# Patient Record
Sex: Male | Born: 1940 | Race: White | Hispanic: No | Marital: Married | Smoking: Former smoker
Health system: Southern US, Community
[De-identification: ages and names within clinical notes are randomized; demographics above are authoritative.]

## PROBLEM LIST (undated history)

## (undated) DIAGNOSIS — D72829 Elevated white blood cell count, unspecified: Secondary | ICD-10-CM

## (undated) DIAGNOSIS — T839XXA Unspecified complication of genitourinary prosthetic device, implant and graft, initial encounter: Secondary | ICD-10-CM

## (undated) DIAGNOSIS — I10 Essential (primary) hypertension: Secondary | ICD-10-CM

## (undated) DIAGNOSIS — E119 Type 2 diabetes mellitus without complications: Secondary | ICD-10-CM

## (undated) DIAGNOSIS — I872 Venous insufficiency (chronic) (peripheral): Secondary | ICD-10-CM

## (undated) DIAGNOSIS — N183 Chronic kidney disease, stage 3 unspecified: Secondary | ICD-10-CM

## (undated) DIAGNOSIS — Z01818 Encounter for other preprocedural examination: Secondary | ICD-10-CM

## (undated) DIAGNOSIS — I739 Peripheral vascular disease, unspecified: Secondary | ICD-10-CM

## (undated) DIAGNOSIS — E1122 Type 2 diabetes mellitus with diabetic chronic kidney disease: Secondary | ICD-10-CM

## (undated) DIAGNOSIS — E785 Hyperlipidemia, unspecified: Secondary | ICD-10-CM

## (undated) DIAGNOSIS — I2510 Atherosclerotic heart disease of native coronary artery without angina pectoris: Secondary | ICD-10-CM

## (undated) DIAGNOSIS — E039 Hypothyroidism, unspecified: Secondary | ICD-10-CM

## (undated) DIAGNOSIS — E7800 Pure hypercholesterolemia, unspecified: Secondary | ICD-10-CM

## (undated) HISTORY — DX: Hypothyroidism, unspecified: E03.9

## (undated) HISTORY — DX: Chronic kidney disease, stage 3 unspecified: N18.30

## (undated) HISTORY — DX: Unspecified complication of genitourinary prosthetic device, implant and graft, initial encounter: T83.9XXA

## (undated) HISTORY — DX: Essential (primary) hypertension: I10

## (undated) HISTORY — DX: Elevated white blood cell count, unspecified: D72.829

## (undated) HISTORY — DX: Type 2 diabetes mellitus with diabetic chronic kidney disease: E11.22

## (undated) HISTORY — DX: Hyperlipidemia, unspecified: E78.5

## (undated) HISTORY — PX: OTHER SURGICAL HISTORY: SHX169

## (undated) HISTORY — DX: Encounter for other preprocedural examination: Z01.818

---

## 2008-09-05 ENCOUNTER — Ambulatory Visit: Payer: Self-pay | Admitting: Gastroenterology

## 2019-12-17 ENCOUNTER — Other Ambulatory Visit: Payer: Self-pay

## 2019-12-17 ENCOUNTER — Emergency Department: Payer: Medicare Other

## 2019-12-17 ENCOUNTER — Emergency Department
Admission: EM | Admit: 2019-12-17 | Discharge: 2019-12-17 | Disposition: A | Discharge status: Home / Self Care | Payer: Medicare Other | Attending: Emergency Medicine | Admitting: Emergency Medicine

## 2019-12-17 ENCOUNTER — Encounter: Payer: Self-pay | Admitting: Medical Oncology

## 2019-12-17 DIAGNOSIS — Z79899 Other long term (current) drug therapy: Secondary | ICD-10-CM | POA: Diagnosis not present

## 2019-12-17 DIAGNOSIS — R519 Headache, unspecified: Secondary | ICD-10-CM | POA: Diagnosis not present

## 2019-12-17 DIAGNOSIS — Z7901 Long term (current) use of anticoagulants: Secondary | ICD-10-CM | POA: Diagnosis not present

## 2019-12-17 DIAGNOSIS — S0121XA Laceration without foreign body of nose, initial encounter: Secondary | ICD-10-CM | POA: Insufficient documentation

## 2019-12-17 DIAGNOSIS — W01190A Fall on same level from slipping, tripping and stumbling with subsequent striking against furniture, initial encounter: Secondary | ICD-10-CM | POA: Insufficient documentation

## 2019-12-17 DIAGNOSIS — Y92003 Bedroom of unspecified non-institutional (private) residence as the place of occurrence of the external cause: Secondary | ICD-10-CM | POA: Insufficient documentation

## 2019-12-17 DIAGNOSIS — S0990XA Unspecified injury of head, initial encounter: Secondary | ICD-10-CM

## 2019-12-17 DIAGNOSIS — S0181XA Laceration without foreign body of other part of head, initial encounter: Secondary | ICD-10-CM | POA: Insufficient documentation

## 2019-12-17 DIAGNOSIS — Y9389 Activity, other specified: Secondary | ICD-10-CM | POA: Insufficient documentation

## 2019-12-17 DIAGNOSIS — Z7982 Long term (current) use of aspirin: Secondary | ICD-10-CM | POA: Insufficient documentation

## 2019-12-17 DIAGNOSIS — I10 Essential (primary) hypertension: Secondary | ICD-10-CM | POA: Insufficient documentation

## 2019-12-17 DIAGNOSIS — Y999 Unspecified external cause status: Secondary | ICD-10-CM | POA: Insufficient documentation

## 2019-12-17 HISTORY — DX: Essential (primary) hypertension: I10

## 2019-12-17 MED ORDER — CEPHALEXIN 500 MG PO CAPS: 500.0000 mg | ORAL_CAPSULE | Freq: Three times a day (TID) | ORAL | 0 refills | Status: AC

## 2019-12-17 MED ORDER — LIDOCAINE-EPINEPHRINE-TETRACAINE (LET) TOPICAL GEL
3.0000 mL | Freq: Once | TOPICAL | Status: AC
  Administered 2019-12-17: 3 mL via TOPICAL

## 2019-12-17 MED ORDER — LIDOCAINE-EPINEPHRINE (PF) 1 %-1:200000 IJ SOLN
30.0000 mL | Freq: Once | INTRAMUSCULAR | Status: AC
  Administered 2019-12-17: 30 mL via INTRADERMAL

## 2019-12-17 NOTE — ED Notes (Signed)
Pt to ct scan.

## 2019-12-17 NOTE — ED Notes (Signed)
See triage note  Presents s/p fall  States he fell while making his bed  Laceration noted over right eye and bridge of nose

## 2019-12-17 NOTE — ED Notes (Signed)
Pt has urinated on self. Pt assisted to restroom, chair cleansed. Pt assisted by lisa ed tech into disposable pants.

## 2019-12-17 NOTE — ED Provider Notes (Signed)
[facility] Emergency Department Provider Note  ____________________________________________  Time seen: Approximately 9:38 AM  I have reviewed the triage vital signs and the nursing notes.   HISTORY  Chief Complaint Laceration    HPI Grant Mcdaniel is a 78 y.o. male that presents to the emergency department for evaluation of head injury.  Patient states that he stumbled while making the bed and hit his head on the headboard.  He did not lose consciousness.  He landed on both of his knees.  He has been walking since the fall.  Last tetanus shot was 3 years ago.  He takes Plavix and aspirin.  He denies any current pain.  Patient denies headache, neck pain.  Past Medical History:  Diagnosis Date  . Hypertension     There are no problems to display for this patient.   Prior to Admission medications   Medication Sig Start Date End Date Taking? Authorizing Provider  aspirin EC 81 MG tablet Take 81 mg by mouth daily.   Yes [provider]  atorvastatin (LIPITOR) 40 MG tablet Take 40 mg by mouth daily.   Yes [provider]  bisoprolol-hydrochlorothiazide (ZIAC) 10-6.25 MG tablet Take 1 tablet by mouth daily.   Yes [provider]  clopidogrel (PLAVIX) 75 MG tablet Take 75 mg by mouth daily.   Yes [provider]  cephALEXin (KEFLEX) 500 MG capsule Take 1 capsule (500 mg total) by mouth 3 (three) times daily for 10 days. 12/17/19 12/27/19  Laban Emperor, PA-C    Allergies Sulfa antibiotics  No family history on file.  Social History Social History   Tobacco Use  . Smoking status: Not on file  Substance Use Topics  . Alcohol use: Not on file  . Drug use: Not on file     Review of Systems  Constitutional: No fever/chills Respiratory: No SOB. Gastrointestinal: No abdominal pain.  No nausea, no vomiting.  Musculoskeletal: Negative for musculoskeletal pain. Skin: Negative for rash, ecchymosis.  Positive for  lacerations. Neurological: Negative for headaches, numbness or tingling   ____________________________________________   PHYSICAL EXAM:  VITAL SIGNS: ED Triage Vitals  Enc Vitals Group     BP 12/17/19 0402 (!) 180/66     Pulse Rate 12/17/19 0402 80     Resp 12/17/19 0402 20     Temp 12/17/19 0402 98.1 F (36.7 C)     Temp Source 12/17/19 0402 Oral     SpO2 12/17/19 0402 99 %     Weight 12/17/19 0403 230 lb (104.3 kg)     Height 12/17/19 0403 6\' 2"  (1.88 m)     Head Circumference --      Peak Flow --      Pain Score 12/17/19 0402 0     Pain Loc --      Pain Edu? --      Excl. in [place]? --      Constitutional: Alert and oriented. Well appearing and in no acute distress. Eyes: Conjunctivae are normal. PERRL. EOMI. Head: 2, 2 cm lacerations to right forehead and  1/4 cm laceration to right forehead.  Ecchymosis surrounding lacerations. ENT:      Ears:      Nose: No congestion/rhinnorhea.  Small abrasion to the bridge of the nose.      Mouth/Throat: Mucous membranes are moist.  Neck: No stridor.  No cervical spine tenderness to palpation. Cardiovascular: Normal rate, regular rhythm.  Good peripheral circulation. Respiratory: Normal respiratory effort without tachypnea or  retractions. Lungs CTAB. Good air entry to the bases with no decreased or absent breath sounds. Musculoskeletal: Full range of motion to all extremities. No gross deformities appreciated. Normal gait. Neurologic:  Normal speech and language. No gross focal neurologic deficits are appreciated.  Skin:  Skin is warm, dry and intact. No rash noted. Psychiatric: Mood and affect are normal. Speech and behavior are normal. Patient exhibits appropriate insight and judgement.   ____________________________________________   LABS (all labs ordered are listed, but only abnormal results are displayed)  Labs Reviewed - No data to  display ____________________________________________  EKG   ____________________________________________  RADIOLOGY Robinette Haines, personally viewed and evaluated these images (plain radiographs) as part of my medical decision making, as well as reviewing the written report by the radiologist.  CT Head Wo Contrast  Result Date: 12/17/2019 CLINICAL DATA:  Fall. EXAM: CT HEAD WITHOUT CONTRAST TECHNIQUE: Contiguous axial images were obtained from the base of the skull through the vertex without intravenous contrast. COMPARISON:  None. FINDINGS: Brain: No evidence of acute infarction, hemorrhage, hydrocephalus, extra-axial collection or mass lesion/mass effect. Small remote bilateral cerebellar infarcts. Mild brain atrophy. Vascular: Atherosclerotic calcification Skull: Right forehead laceration.  No calvarial fracture. Sinuses/Orbits: No evidence of injury IMPRESSION: 1. No evidence of intracranial injury. 2. Right forehead laceration without fracture. Electronically Signed   By: Monte Fantasia M.D.   On: 12/17/2019 04:30   CT Cervical Spine Wo Contrast  Addendum Date: 12/17/2019   ADDENDUM REPORT: 12/17/2019 09:36 ADDENDUM: Correction to impression #2, the second sentence should read in the upper CERVICAL levels there is also posterior longitudinal ligament ossification. Electronically Signed   By: Monte Fantasia M.D.   On: 12/17/2019 09:36   Result Date: 12/17/2019 CLINICAL DATA:  Fall with laceration. EXAM: CT CERVICAL SPINE WITHOUT CONTRAST TECHNIQUE: Multidetector CT imaging of the cervical spine was performed without intravenous contrast. Multiplanar CT image reconstructions were also generated. COMPARISON:  None. FINDINGS: Alignment: Normal Skull base and vertebrae: Negative for fracture. 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 There is ankylosis from the occiput to T2. 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 bone island in the C2 body. Soft tissues and spinal canal: No prevertebral fluid or swelling. No visible canal hematoma. Lipoma in the posterior left para median neck the even more simple internal architecture than adjacent fat, up to 3 cm on axial slices. Disc levels:  Spondylitic changes as noted above. Upper chest: Negative IMPRESSION: 1. No acute finding. 2. 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 In the upper thoracic levels there is also posterior longitudinal ligament ossification. Electronically Signed: By: Monte Fantasia M.D. On: 12/17/2019 09:04    ____________________________________________    PROCEDURES  Procedure(s) performed:    Procedures  LACERATION REPAIR Performed by: Laban Emperor  Consent: Verbal consent obtained.  Consent given by: patient  Prepped and Draped in normal sterile fashion  Wound explored: No foreign bodies   Laceration Location: forhead  Laceration Length: 2cm  Anesthesia: None  Local anesthetic: lidocaine 1% with epinephrine  Anesthetic total: 3 ml  Irrigation method: syringe  Amount of cleaning: 577ml normal saline  Skin closure: 5-0 nylon  Number of sutures: 4-5  Technique: Simple interrupted  Patient tolerance: Patient tolerated the procedure well with no immediate complications.  LACERATION REPAIR Performed by: Laban Emperor  Consent: Verbal consent obtained.  Consent given by: patient  Prepped and Draped in normal sterile fashion  Wound explored: No foreign bodies   Laceration Location: forehead  Laceration Length: 2 cm  Anesthesia: None  Local anesthetic: lidocaine 1% with epinephrine  Anesthetic total: 3 ml  Irrigation method: syringe  Amount of cleaning: 526ml normal saline  Skin closure: 5-0 nylon  Number of sutures: 4-5  Technique: Simple interrupted  Patient tolerance: Patient tolerated the procedure well with no immediate  complications.  LACERATION REPAIR Performed by: Laban Emperor  Consent: Verbal consent obtained.  Consent given by: patient  Prepped and Draped in normal sterile fashion  Wound explored: No foreign bodies   Laceration Location: forehead and bridge of nose  Laceration Length: 1/4 cm, 1/4 cm  Anesthesia: None  Local anesthetic: Noone  Irrigation method: syringe  Amount of cleaning: 512ml normal saline  Skin closure: dermabond  Patient tolerance: Patient tolerated the procedure well with no immediate complications.  Medications  lidocaine-EPINEPHrine-tetracaine (LET) topical gel (3 mLs Topical Given by Other 12/17/19 0815)  lidocaine-EPINEPHrine (XYLOCAINE-EPINEPHrine) 1 %-1:200000 (PF) injection 30 mL (30 mLs Intradermal Given by Other 12/17/19 0815)     ____________________________________________   INITIAL IMPRESSION / ASSESSMENT AND PLAN / ED COURSE  Pertinent labs & imaging results that were available during my care of the patient were reviewed by me and considered in my medical decision making (see chart for details).  Review of the [place] CSRS was performed in accordance of the [place] prior to dispensing any controlled drugs.     Patient presented to the emergency department for evaluation of head injury.  Vital signs and exam are reassuring.  Patient appears well and denies any pain currently.  Lacerations were repaired with stitches.  CT head is negative for acute abnormalities.  Orbits are visualized on CT head and negative for fracture.  CT cervical spine negative for acute bony abnormalities.  Patient feels well and is ready to go home. He is ambulatory around the ED and ambulates himself to the restroom. Patient will be discharged home with prescriptions for keflex. Patient is to follow up with PCP as directed. Patient is given ED precautions to return to the ED for any worsening or new symptoms.  CHASETON PAULIN was evaluated in Emergency Department on  12/17/2019 for the symptoms described in the history of present illness. He was evaluated in the context of the global COVID-19 pandemic, which necessitated consideration that the patient might be at risk for infection with the SARS-CoV-2 virus that causes COVID-19. Institutional protocols and algorithms that pertain to the evaluation of patients at risk for COVID-19 are in a state of rapid change based on information released by regulatory bodies including the CDC and federal and state organizations. These policies and algorithms were followed during the patient's care in the ED.   ____________________________________________  FINAL CLINICAL IMPRESSION(S) / ED DIAGNOSES  Final diagnoses:  Injury of head, initial encounter      NEW MEDICATIONS STARTED DURING THIS VISIT:  ED Discharge Orders         Ordered    cephALEXin (KEFLEX) 500 MG capsule  3 times daily     12/17/19 0944              This chart was dictated using voice recognition software/Dragon. Despite best efforts to proofread, errors can occur which can change the meaning. Any change was purely unintentional.    Laban Emperor, PA-C 12/17/19 1456    Lavonia Drafts, MD 12/17/19 [phone]

## 2019-12-17 NOTE — ED Notes (Signed)
Pt sitting in wheelchair in lobby watching tv in no acute distress.

## 2019-12-17 NOTE — ED Triage Notes (Signed)
Pt reports he got out of bed to straighten up his blankets when he tripped and fell into dresser. Pt has lac to rt eyelid. Pt takes plavix. Dressing applied. Pt denies LOC. A/O x 4, ambulatory.

## 2019-12-19 ENCOUNTER — Inpatient Hospital Stay: Payer: Medicare Other | Admitting: Hematology and Oncology

## 2019-12-19 DIAGNOSIS — D472 Monoclonal gammopathy: Secondary | ICD-10-CM | POA: Insufficient documentation

## 2019-12-19 HISTORY — DX: Monoclonal gammopathy: D47.2

## 2019-12-19 NOTE — Progress Notes (Deleted)
[facility]  [address] [place], [place] [address] Phone: [phone]  Fax: [phone]   Clinic Day:  12/19/2019  Referring physician: No ref. provider found  Chief Complaint: Grant Mcdaniel is a 78 y.o. male with an abnormal SPEP who is referred in consultation by Dr Franciso Bend for assessment and management.   HPI:  The patient has chronic kidney disease stage G3/A2.  He was seen by Dr. Smith Mince and diagnosed with an abnormal SPEP in mild hypercalcemia on 12/04/2019. He has CKD.  Creatinine was 1.62 and calcium 10.8 on 02/27/2019.  Creatinine has ranged between 1.2 - 1.7 without trend from 12/27/2017 - 08/21/2019.  Calcium has ranged between 10.1 - 11.3 from 02/13/2018 - 08/21/2019.  Hemoglobin was 12.7 on 05/16/2018.  SPEP on 02/27/2019 revealed slight irregularity in the gamma region which may represent a monoclonal protein.  Immunofixation revealed the monoclonal protein was free lambda light chains.  UPEP on 02/27/2019 revealed no significant proteinuria.  Kappa free light chains were 3.69, lambda free light chains 2.33 and a ratio 1.58 (0.26-1.65) on 02/27/2019.  Kappa free light chains were 3.26 with a ratio of 1.66 on 05/16/2018.  Renal ultrasound on 05/23/2018 revealed no hydronephrosis and an enlarged prostate.  He was seen in [facility] ER on 12/17/2019 s/p a fall.  Head CT was negative.  He sustained a right forehead laceration requiring sutures.  He was given Keflex.  Symptomatically, ***   Past Medical History:  Diagnosis Date  . Hypertension     *** The histories are not reviewed yet. Please review them in the "History" navigator section and refresh this [place].  No family history on file.  Social History:  has no history on file for tobacco, alcohol, and drug.The patient is {Blank single:19197::"alone","accompanied by"} *** today.  Allergies:  Allergies  Allergen Reactions  . Sulfa Antibiotics Nausea And Vomiting     Current Medications: Current Outpatient Medications  Medication Sig Dispense Refill  . aspirin EC 81 MG tablet Take 81 mg by mouth daily.    Marland Kitchen atorvastatin (LIPITOR) 40 MG tablet Take 40 mg by mouth daily.    . bisoprolol-hydrochlorothiazide (ZIAC) 10-6.25 MG tablet Take 1 tablet by mouth daily.    . cephALEXin (KEFLEX) 500 MG capsule Take 1 capsule (500 mg total) by mouth 3 (three) times daily for 10 days. 30 capsule 0  . clopidogrel (PLAVIX) 75 MG tablet Take 75 mg by mouth daily.     No current facility-administered medications for this visit.    ROS  Performance status (ECOG): {CHL ONC MK:[id number]}  Vitals There were no vitals taken for this visit.   Physical Exam  No visits with results within 3 Day(s) from this visit.  Latest known visit with results is:  No results found for any previous visit.    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  SPEP on 02/27/2019 revealed slight irregularity in the gamma region which may represent a monoclonal protein.  Immunofixation revealed the monoclonal protein was free lambda light chains.  UPEP on 02/27/2019 revealed no significant proteinuria.  Kappa free light chains were 3.69, lambda free light chains 2.33 and a ratio 1.58 (0.26-1.65) on 02/27/2019.  Kappa free light chains were 3.26 with a ratio of 1.66 on 05/16/2018.  He has stage G3/A2 chronic kidney disease.  Creatinine has ranged between 1.2 - 1.7 without trend from 12/27/2017 - 08/21/2019.  Calcium  has ranged between 10.1 - 11.3 from 02/13/2018 - 08/21/2019.  Symptomatically, ***  Plan: 1.   Labs today:  CBC with diff, ferritin, iron studies, B12, folate, TSH, myeloma panel, FLCA. 2.   24 hour UPEP and free light chains 3.   Monoclonal gammopathy  Discuss monoclonal gammopathy of unknown significance (MGUS) vs multiple myeloma.  Discuss CRAB criteria;   Hypercalcemia, renal dysfunction, anemia, bone  lesions.  Discuss work-up. 4.   Hypercalcemia  Calcium has ranged between 10.1 - 11.3.  PTH was 40.5 (normal) on 05/16/2018.  Vitamin D 25-OH was 62.8 (normal) on 02/27/2019 and 57.6 (normal) on 05/17/2018.  Check PTHrp, 1,25-dihydroxyvitamin D.  I discussed the assessment and treatment plan with the patient.  The patient was provided an opportunity to ask questions and all were answered.  The patient agreed with the plan and demonstrated an understanding of the instructions.  The patient was advised to call back if the symptoms worsen or if the condition fails to improve as anticipated.  I provided *** minutes (8:57 AM - 8:57 AM) of face-to-face time during this this encounter and > 50% was spent counseling as documented under my assessment and plan.    Lexee Brashears C. Mike Gip, MD, PhD    12/19/2019, 8:57 AM  I, Samul Dada, am acting as scribe for [employer]. Mike Gip, MD, PhD.  {Add scribe attestation statement}

## 2019-12-27 ENCOUNTER — Ambulatory Visit (INDEPENDENT_AMBULATORY_CARE_PROVIDER_SITE_OTHER)
Admission: EM | Admit: 2019-12-27 | Discharge: 2019-12-27 | Disposition: A | Discharge status: Other Acute Inpatient Hospital | Payer: Medicare Other | Source: Home / Self Care | Attending: Family Medicine | Admitting: Family Medicine

## 2019-12-27 ENCOUNTER — Emergency Department: Payer: Medicare Other

## 2019-12-27 ENCOUNTER — Ambulatory Visit (INDEPENDENT_AMBULATORY_CARE_PROVIDER_SITE_OTHER): Payer: Medicare Other

## 2019-12-27 ENCOUNTER — Inpatient Hospital Stay: Payer: Medicare Other | Admitting: Hematology and Oncology

## 2019-12-27 ENCOUNTER — Encounter: Payer: Self-pay | Admitting: Emergency Medicine

## 2019-12-27 ENCOUNTER — Inpatient Hospital Stay
Admission: EM | Admit: 2019-12-27 | Discharge: 2020-01-06 | DRG: 521 | Disposition: A | Discharge status: Skilled Nursing Facility | Payer: Medicare Other | Attending: Hospitalist | Admitting: Hospitalist

## 2019-12-27 ENCOUNTER — Other Ambulatory Visit: Payer: Self-pay

## 2019-12-27 DIAGNOSIS — Z66 Do not resuscitate: Secondary | ICD-10-CM | POA: Diagnosis present

## 2019-12-27 DIAGNOSIS — Z87891 Personal history of nicotine dependence: Secondary | ICD-10-CM

## 2019-12-27 DIAGNOSIS — E039 Hypothyroidism, unspecified: Secondary | ICD-10-CM | POA: Diagnosis present

## 2019-12-27 DIAGNOSIS — D472 Monoclonal gammopathy: Secondary | ICD-10-CM | POA: Diagnosis present

## 2019-12-27 DIAGNOSIS — I2510 Atherosclerotic heart disease of native coronary artery without angina pectoris: Secondary | ICD-10-CM | POA: Diagnosis present

## 2019-12-27 DIAGNOSIS — T839XXS Unspecified complication of genitourinary prosthetic device, implant and graft, sequela: Secondary | ICD-10-CM | POA: Diagnosis not present

## 2019-12-27 DIAGNOSIS — R310 Gross hematuria: Secondary | ICD-10-CM | POA: Diagnosis not present

## 2019-12-27 DIAGNOSIS — I6381 Other cerebral infarction due to occlusion or stenosis of small artery: Secondary | ICD-10-CM | POA: Diagnosis not present

## 2019-12-27 DIAGNOSIS — T83028A Displacement of other indwelling urethral catheter, initial encounter: Secondary | ICD-10-CM | POA: Diagnosis not present

## 2019-12-27 DIAGNOSIS — D72829 Elevated white blood cell count, unspecified: Secondary | ICD-10-CM

## 2019-12-27 DIAGNOSIS — W101XXA Fall (on)(from) sidewalk curb, initial encounter: Secondary | ICD-10-CM | POA: Diagnosis present

## 2019-12-27 DIAGNOSIS — N1831 Chronic kidney disease, stage 3a: Secondary | ICD-10-CM | POA: Diagnosis present

## 2019-12-27 DIAGNOSIS — Z7902 Long term (current) use of antithrombotics/antiplatelets: Secondary | ICD-10-CM | POA: Diagnosis not present

## 2019-12-27 DIAGNOSIS — N400 Enlarged prostate without lower urinary tract symptoms: Secondary | ICD-10-CM | POA: Diagnosis present

## 2019-12-27 DIAGNOSIS — Z01818 Encounter for other preprocedural examination: Secondary | ICD-10-CM | POA: Diagnosis not present

## 2019-12-27 DIAGNOSIS — Z20822 Contact with and (suspected) exposure to covid-19: Secondary | ICD-10-CM | POA: Diagnosis present

## 2019-12-27 DIAGNOSIS — S72001S Fracture of unspecified part of neck of right femur, sequela: Secondary | ICD-10-CM

## 2019-12-27 DIAGNOSIS — I872 Venous insufficiency (chronic) (peripheral): Secondary | ICD-10-CM | POA: Diagnosis present

## 2019-12-27 DIAGNOSIS — Z8249 Family history of ischemic heart disease and other diseases of the circulatory system: Secondary | ICD-10-CM | POA: Diagnosis not present

## 2019-12-27 DIAGNOSIS — Z7982 Long term (current) use of aspirin: Secondary | ICD-10-CM

## 2019-12-27 DIAGNOSIS — R319 Hematuria, unspecified: Secondary | ICD-10-CM

## 2019-12-27 DIAGNOSIS — Z79899 Other long term (current) drug therapy: Secondary | ICD-10-CM

## 2019-12-27 DIAGNOSIS — S72001A Fracture of unspecified part of neck of right femur, initial encounter for closed fracture: Secondary | ICD-10-CM | POA: Diagnosis not present

## 2019-12-27 DIAGNOSIS — Y846 Urinary catheterization as the cause of abnormal reaction of the patient, or of later complication, without mention of misadventure at the time of the procedure: Secondary | ICD-10-CM | POA: Diagnosis not present

## 2019-12-27 DIAGNOSIS — E785 Hyperlipidemia, unspecified: Secondary | ICD-10-CM | POA: Insufficient documentation

## 2019-12-27 DIAGNOSIS — D62 Acute posthemorrhagic anemia: Secondary | ICD-10-CM | POA: Diagnosis not present

## 2019-12-27 DIAGNOSIS — N183 Chronic kidney disease, stage 3 unspecified: Secondary | ICD-10-CM | POA: Diagnosis not present

## 2019-12-27 DIAGNOSIS — Z882 Allergy status to sulfonamides status: Secondary | ICD-10-CM

## 2019-12-27 DIAGNOSIS — I10 Essential (primary) hypertension: Secondary | ICD-10-CM | POA: Diagnosis not present

## 2019-12-27 DIAGNOSIS — I129 Hypertensive chronic kidney disease with stage 1 through stage 4 chronic kidney disease, or unspecified chronic kidney disease: Secondary | ICD-10-CM | POA: Diagnosis present

## 2019-12-27 DIAGNOSIS — E1151 Type 2 diabetes mellitus with diabetic peripheral angiopathy without gangrene: Secondary | ICD-10-CM | POA: Diagnosis present

## 2019-12-27 DIAGNOSIS — I739 Peripheral vascular disease, unspecified: Secondary | ICD-10-CM

## 2019-12-27 DIAGNOSIS — Z9049 Acquired absence of other specified parts of digestive tract: Secondary | ICD-10-CM

## 2019-12-27 DIAGNOSIS — M25551 Pain in right hip: Secondary | ICD-10-CM | POA: Diagnosis not present

## 2019-12-27 DIAGNOSIS — A4159 Other Gram-negative sepsis: Secondary | ICD-10-CM | POA: Diagnosis not present

## 2019-12-27 DIAGNOSIS — G9341 Metabolic encephalopathy: Secondary | ICD-10-CM | POA: Diagnosis not present

## 2019-12-27 DIAGNOSIS — M25521 Pain in right elbow: Secondary | ICD-10-CM

## 2019-12-27 DIAGNOSIS — R0602 Shortness of breath: Secondary | ICD-10-CM

## 2019-12-27 DIAGNOSIS — Z955 Presence of coronary angioplasty implant and graft: Secondary | ICD-10-CM

## 2019-12-27 DIAGNOSIS — T839XXD Unspecified complication of genitourinary prosthetic device, implant and graft, subsequent encounter: Secondary | ICD-10-CM | POA: Diagnosis not present

## 2019-12-27 DIAGNOSIS — M25569 Pain in unspecified knee: Secondary | ICD-10-CM

## 2019-12-27 DIAGNOSIS — Z79890 Hormone replacement therapy (postmenopausal): Secondary | ICD-10-CM

## 2019-12-27 DIAGNOSIS — E1122 Type 2 diabetes mellitus with diabetic chronic kidney disease: Secondary | ICD-10-CM | POA: Diagnosis present

## 2019-12-27 DIAGNOSIS — I639 Cerebral infarction, unspecified: Secondary | ICD-10-CM | POA: Diagnosis not present

## 2019-12-27 DIAGNOSIS — T839XXA Unspecified complication of genitourinary prosthetic device, implant and graft, initial encounter: Secondary | ICD-10-CM | POA: Diagnosis not present

## 2019-12-27 HISTORY — DX: Chronic kidney disease, stage 3 unspecified: N18.30

## 2019-12-27 HISTORY — DX: Venous insufficiency (chronic) (peripheral): I87.2

## 2019-12-27 HISTORY — DX: Atherosclerotic heart disease of native coronary artery without angina pectoris: I25.10

## 2019-12-27 HISTORY — DX: Peripheral vascular disease, unspecified: I73.9

## 2019-12-27 HISTORY — DX: Type 2 diabetes mellitus without complications: E11.9

## 2019-12-27 HISTORY — DX: Hypothyroidism, unspecified: E03.9

## 2019-12-27 HISTORY — DX: Pure hypercholesterolemia, unspecified: E78.00

## 2019-12-27 HISTORY — DX: Fracture of unspecified part of neck of right femur, sequela: S72.001S

## 2019-12-27 LAB — CBC WITH DIFFERENTIAL/PLATELET
Abs Immature Granulocytes: 0.11 10*3/uL — ABNORMAL HIGH (ref 0.00–0.07)
Basophils Absolute: 0 10*3/uL (ref 0.0–0.1)
Basophils Relative: 0 %
Eosinophils Absolute: 0 10*3/uL (ref 0.0–0.5)
Eosinophils Relative: 0 %
HCT: 38.8 % — ABNORMAL LOW (ref 39.0–52.0)
Hemoglobin: 11.9 g/dL — ABNORMAL LOW (ref 13.0–17.0)
Immature Granulocytes: 1 %
Lymphocytes Relative: 9 %
Lymphs Abs: 1.4 10*3/uL (ref 0.7–4.0)
MCH: 28.7 pg (ref 26.0–34.0)
MCHC: 30.7 g/dL (ref 30.0–36.0)
MCV: 93.5 fL (ref 80.0–100.0)
Monocytes Absolute: 0.8 10*3/uL (ref 0.1–1.0)
Monocytes Relative: 5 %
Neutro Abs: 13.8 10*3/uL — ABNORMAL HIGH (ref 1.7–7.7)
Neutrophils Relative %: 85 %
Platelets: 167 10*3/uL (ref 150–400)
RBC: 4.15 MIL/uL — ABNORMAL LOW (ref 4.22–5.81)
RDW: 14.1 % (ref 11.5–15.5)
WBC: 16.2 10*3/uL — ABNORMAL HIGH (ref 4.0–10.5)
nRBC: 0 % (ref 0.0–0.2)

## 2019-12-27 LAB — COMPREHENSIVE METABOLIC PANEL
ALT: 16 U/L (ref 0–44)
AST: 37 U/L (ref 15–41)
Albumin: 3.9 g/dL (ref 3.5–5.0)
Alkaline Phosphatase: 101 U/L (ref 38–126)
Anion gap: 13 (ref 5–15)
BUN: 26 mg/dL — ABNORMAL HIGH (ref 8–23)
CO2: 24 mmol/L (ref 22–32)
Calcium: 9.7 mg/dL (ref 8.9–10.3)
Chloride: 101 mmol/L (ref 98–111)
Creatinine, Ser: 1.44 mg/dL — ABNORMAL HIGH (ref 0.61–1.24)
GFR calc Af Amer: 54 mL/min — ABNORMAL LOW (ref >60)
GFR calc non Af Amer: 47 mL/min — ABNORMAL LOW (ref >60)
Glucose, Bld: 177 mg/dL — ABNORMAL HIGH (ref 70–99)
Potassium: 4.6 mmol/L (ref 3.5–5.1)
Sodium: 138 mmol/L (ref 135–145)
Total Bilirubin: 1.8 mg/dL — ABNORMAL HIGH (ref 0.3–1.2)
Total Protein: 7.5 g/dL (ref 6.5–8.1)

## 2019-12-27 LAB — HEMOGLOBIN A1C
Hgb A1c MFr Bld: 7.3 % — ABNORMAL HIGH (ref 4.8–5.6)
Mean Plasma Glucose: 162.81 mg/dL

## 2019-12-27 LAB — GLUCOSE, CAPILLARY: Glucose-Capillary: 150 mg/dL — ABNORMAL HIGH (ref 70–99)

## 2019-12-27 MED ORDER — MORPHINE SULFATE (PF) 2 MG/ML IV SOLN
2.0000 mg | INTRAVENOUS | Status: DC | PRN
  Administered 2019-12-27: 2 mg via INTRAVENOUS
  Filled 2019-12-27: qty 1

## 2019-12-27 MED ORDER — SODIUM CHLORIDE 0.9 % IV SOLN: INTRAVENOUS | Status: DC

## 2019-12-27 MED ORDER — ONDANSETRON HCL 4 MG/2ML IJ SOLN: 4.0000 mg | Freq: Four times a day (QID) | INTRAMUSCULAR | Status: DC | PRN

## 2019-12-27 MED ORDER — LEVOTHYROXINE SODIUM 50 MCG PO TABS
75.0000 ug | ORAL_TABLET | Freq: Every day | ORAL | Status: DC
  Administered 2019-12-29 – 2020-01-06 (×8): 75 ug via ORAL
  Filled 2019-12-27 (×10): qty 1

## 2019-12-27 MED ORDER — AMLODIPINE BESY-BENAZEPRIL HCL 10-40 MG PO CAPS: 1.0000 | ORAL_CAPSULE | Freq: Every day | ORAL | Status: DC

## 2019-12-27 MED ORDER — CEFAZOLIN SODIUM-DEXTROSE 2-4 GM/100ML-% IV SOLN
2.0000 g | INTRAVENOUS | Status: AC
  Administered 2019-12-28: 08:00:00 2 g via INTRAVENOUS

## 2019-12-27 MED ORDER — TAMSULOSIN HCL 0.4 MG PO CAPS
0.4000 mg | ORAL_CAPSULE | Freq: Every day | ORAL | Status: DC
  Administered 2019-12-29 – 2020-01-06 (×9): 0.4 mg via ORAL
  Filled 2019-12-27 (×10): qty 1

## 2019-12-27 MED ORDER — ONDANSETRON HCL 4 MG/2ML IJ SOLN
4.0000 mg | Freq: Once | INTRAMUSCULAR | Status: AC
  Administered 2019-12-27: 4 mg via INTRAVENOUS
  Filled 2019-12-27: qty 2

## 2019-12-27 MED ORDER — INSULIN ASPART 100 UNIT/ML ~~LOC~~ SOLN: 0.0000 [IU] | Freq: Every day | SUBCUTANEOUS | Status: DC

## 2019-12-27 MED ORDER — INSULIN ASPART 100 UNIT/ML ~~LOC~~ SOLN
0.0000 [IU] | Freq: Three times a day (TID) | SUBCUTANEOUS | Status: DC
  Administered 2019-12-28 (×2): 2 [IU] via SUBCUTANEOUS
  Administered 2019-12-29: 1 [IU] via SUBCUTANEOUS
  Administered 2019-12-29: 2 [IU] via SUBCUTANEOUS
  Administered 2019-12-29 – 2019-12-30 (×2): 1 [IU] via SUBCUTANEOUS
  Administered 2019-12-30: 2 [IU] via SUBCUTANEOUS
  Administered 2019-12-31: 3 [IU] via SUBCUTANEOUS
  Administered 2019-12-31 (×2): 1 [IU] via SUBCUTANEOUS
  Administered 2020-01-01: 2 [IU] via SUBCUTANEOUS
  Administered 2020-01-01 – 2020-01-02 (×4): 1 [IU] via SUBCUTANEOUS
  Administered 2020-01-02 – 2020-01-05 (×4): 2 [IU] via SUBCUTANEOUS
  Administered 2020-01-05 – 2020-01-06 (×3): 1 [IU] via SUBCUTANEOUS
  Administered 2020-01-06: 2 [IU] via SUBCUTANEOUS
  Filled 2019-12-27 (×23): qty 1

## 2019-12-27 MED ORDER — FENTANYL CITRATE (PF) 100 MCG/2ML IJ SOLN: 50.0000 ug | INTRAMUSCULAR | Status: DC | PRN

## 2019-12-27 MED ORDER — ACETAMINOPHEN 325 MG PO TABS: 650.0000 mg | ORAL_TABLET | Freq: Four times a day (QID) | ORAL | Status: DC | PRN

## 2019-12-27 MED ORDER — ONDANSETRON HCL 4 MG PO TABS: 4.0000 mg | ORAL_TABLET | Freq: Four times a day (QID) | ORAL | Status: DC | PRN

## 2019-12-27 MED ORDER — BISOPROLOL-HYDROCHLOROTHIAZIDE 10-6.25 MG PO TABS
1.0000 | ORAL_TABLET | Freq: Every day | ORAL | Status: DC
  Administered 2019-12-28 – 2020-01-01 (×5): 1 via ORAL
  Filled 2019-12-27 (×7): qty 1

## 2019-12-27 MED ORDER — ACETAMINOPHEN 650 MG RE SUPP: 650.0000 mg | Freq: Four times a day (QID) | RECTAL | Status: DC | PRN

## 2019-12-27 MED ORDER — OXYCODONE HCL 5 MG PO TABS
5.0000 mg | ORAL_TABLET | ORAL | Status: DC | PRN
  Administered 2019-12-27: 5 mg via ORAL
  Filled 2019-12-27: qty 1

## 2019-12-27 MED ORDER — ATORVASTATIN CALCIUM 20 MG PO TABS
40.0000 mg | ORAL_TABLET | Freq: Every day | ORAL | Status: DC
  Administered 2019-12-28 – 2020-01-05 (×9): 40 mg via ORAL
  Filled 2019-12-27 (×9): qty 2

## 2019-12-27 MED ORDER — LORATADINE 10 MG PO TABS
10.0000 mg | ORAL_TABLET | Freq: Every day | ORAL | Status: DC
  Administered 2019-12-29 – 2020-01-06 (×9): 10 mg via ORAL
  Filled 2019-12-27 (×10): qty 1

## 2019-12-27 NOTE — ED Notes (Signed)
fsbs 150

## 2019-12-27 NOTE — H&P (Signed)
[facility] NAME: Grant Mcdaniel    MR#:  [id number]  DATE OF BIRTH:  12-26-1940  DATE OF ADMISSION:  12/27/2019  PRIMARY CARE PHYSICIAN: [facility] [place]   REQUESTING/REFERRING PHYSICIAN: Dr Merlyn Lot  CHIEF COMPLAINT:   Chief Complaint  Patient presents with  . Fall    HISTORY OF PRESENT ILLNESS:  Grant Mcdaniel  is a 79 y.o. male presents to the hospital after a fall.  He was out in the driveway and was walking and tried to go up over the curb but did not make it and he tripped and fell.  He landed on his right side and is having pain in the right hip and right elbow area.  In the ER, he was found to have a right hip fracture, right elbow swelling.  Hospitalist services were contacted for further evaluation.  The ER physician spoke with the orthopedic surgeon to come see the patient.  Of note the patient did have a fall 2 weeks ago and has bruising around his right eye and forehead.  The patient has no chest pain or shortness of breath.  PAST MEDICAL HISTORY:   Past Medical History:  Diagnosis Date  . CAD (coronary artery disease)   . 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 Diabetes mellitus without complication ([place])   . Hypercholesterolemia   . Hypertension   . Hypertension   . Hypothyroid   . PVD (peripheral vascular disease) ([place])   . Venous insufficiency     PAST SURGICAL HISTORY:   Past Surgical History:  Procedure Laterality Date  . galbladder      SOCIAL HISTORY:   Social History   Tobacco Use  . Smoking status: Never Smoker  . Smokeless tobacco: Never Used  Substance Use Topics  . Alcohol use: Never    FAMILY HISTORY:   Family History  Problem Relation Age of Onset  . Cancer Mother   . CAD Father     DRUG ALLERGIES:   Allergies  Allergen Reactions  . Sulfa Antibiotics Nausea And Vomiting    REVIEW OF SYSTEMS:  CONSTITUTIONAL: No fever, chills or  sweats.  No fatigue. EYES: No blurred or double vision.  Wears glasses.  Has cataracts. EARS, NOSE, AND THROAT: No tinnitus or ear pain. No sore throat RESPIRATORY: No cough, shortness of breath, wheezing or hemoptysis.  CARDIOVASCULAR: No chest pain, orthopnea, edema.  GASTROINTESTINAL: No nausea, vomiting, diarrhea or abdominal pain. No blood in bowel movements GENITOURINARY: No dysuria, hematuria.  ENDOCRINE: No polyuria, nocturia,  HEMATOLOGY: No anemia, easy bruising or bleeding SKIN: No rash or lesion. MUSCULOSKELETAL: Right hip pain, right elbow pain NEUROLOGIC: No tingling, numbness, weakness.  PSYCHIATRY: No anxiety or depression.   MEDICATIONS AT HOME:   Prior to Admission medications   Medication Sig Start Date End Date Taking? Authorizing Provider  amLODipine-benazepril (LOTREL) 10-40 MG capsule Take by mouth. 12/23/16   [provider]  aspirin EC 81 MG tablet Take 81 mg by mouth daily.    [provider]  atorvastatin (LIPITOR) 40 MG tablet Take 40 mg by mouth daily.    [provider]  bisoprolol-hydrochlorothiazide (ZIAC) 10-6.25 MG tablet Take 1 tablet by mouth daily.    [provider]  cephALEXin (KEFLEX) 500 MG capsule Take 1 capsule (500 mg total) by mouth 3 (three) times daily for 10 days. 12/17/19 12/27/19  Laban Emperor, PA-C  cetirizine (ZYRTEC) 10 MG tablet  Take by mouth.    [provider]  Cholecalciferol 25 MCG (1000 UT) capsule Take by mouth.    [provider]  clopidogrel (PLAVIX) 75 MG tablet Take 75 mg by mouth daily.    [provider]  Coenzyme Q10 10 MG capsule Take by mouth.    [provider]  levothyroxine (SYNTHROID) 75 MCG tablet Take by mouth. 02/19/19   [provider]  linagliptin (TRADJENTA) 5 MG TABS tablet  01/17/18   [provider]  tamsulosin (FLOMAX) 0.4 MG CAPS capsule Take by mouth. 12/24/16   [provider]   Medication reconciliation still  undergoing.  VITAL SIGNS:  Blood pressure (!) 147/70, pulse 69, temperature (!) 97.5 F (36.4 C), temperature source Oral, resp. rate 18, height 6\' 2"  (1.88 m), weight 106.1 kg, SpO2 98 %.  PHYSICAL EXAMINATION:  GENERAL:  79 y.o.-year-old patient lying in the bed with no acute distress.  EYES: Pupils equal, round, reactive to light and accommodation. No scleral icterus. Extraocular muscles intact.  HEENT:  Oropharynx and nasopharynx clear.  NECK:  Supple, no jugular venous distention. No thyroid enlargement, no tenderness.  LUNGS: Normal breath sounds bilaterally, no wheezing, rales,rhonchi or crepitation. No use of accessory muscles of respiration.  CARDIOVASCULAR: S1, S2 normal.  2 out of 6 systolic murmurs.  No rubs, or gallops.  ABDOMEN: Soft, nontender, nondistended. Bowel sounds present. No organomegaly or mass.  EXTREMITIES: 2+ pedal edema.right leg shortened and externally rotated.  No cyanosis, or clubbing.  Swelling around the right elbow but able to flex and extend at the elbow. NEUROLOGIC: Cranial nerves II through XII are intact. Sensation intact. Gait not checked.  PSYCHIATRIC: The patient is alert and oriented x 3.  SKIN: Bruising around the right face, eye and forehead  LABORATORY PANEL:   CBC Recent Labs  Lab 12/27/19 1719  WBC 16.2*  HGB 11.9*  HCT 38.8*  PLT 167   ------------------------------------------------------------------------------------------------------------------  Chemistries  No results for input(s): NA, K, CL, CO2, GLUCOSE, BUN, CREATININE, CALCIUM, MG, AST, ALT, ALKPHOS, BILITOT in the last 168 hours.  Invalid input(s): GFRCGP ------------------------------------------------------------------------------------------------------------------   RADIOLOGY:  DG Elbow Complete Right  Result Date: 12/27/2019 CLINICAL DATA:  Recent fall with right elbow pain, initial encounter EXAM: RIGHT ELBOW - COMPLETE 3+ VIEW COMPARISON:  None. FINDINGS:  Considerable degenerative changes of the elbow joint are noted particularly in the articulation of the humerus and ulna. Soft tissue swelling is noted posteriorly consistent with the recent injury. No joint effusion is seen. Large olecranon spurs are seen as well as some findings suggestive of olecranon bursitis. IMPRESSION: No acute fracture is noted. Soft tissue changes are seen consistent with the given clinical history. Electronically Signed   By: Inez Catalina M.D.   On: 12/27/2019 16:16   DG Chest Portable 1 View  Result Date: 12/27/2019 CLINICAL DATA:  Right hip pain, hypertension EXAM: PORTABLE CHEST 1 VIEW COMPARISON:  None. FINDINGS: Heart size is mildly enlarged. Aortic atherosclerosis. Both lungs are clear. The visualized skeletal structures are unremarkable. IMPRESSION: 1. No active cardiopulmonary disease. 2. Mild cardiomegaly. 3. Aortic atherosclerosis. Electronically Signed   By: Davina Poke D.O.   On: 12/27/2019 17:44   DG Hip Unilat W or Wo Pelvis 2-3 Views Right  Result Date: 12/27/2019 CLINICAL DATA:  Fall. EXAM: DG HIP (WITH OR WITHOUT PELVIS) 2-3V RIGHT COMPARISON:  No recent. FINDINGS: Diffuse severe osteopenia and degenerative change. Angulated fracture of the right femoral neck is noted. No evidence of dislocation.  IMPRESSION: 1.  Angulated right femoral neck fracture. 2.  Diffuse severe osteopenia degenerative change. Electronically Signed   By: Marcello Moores  Register   On: 12/27/2019 16:16    EKG:   Interpreted by me.  Normal sinus rhythm 60 bpm, incomplete right bundle branch block.  No acute ST-T wave changes.  IMPRESSION AND PLAN:   1.  Preop evaluation for right hip fracture.  Hold Plavix and aspirin for surgery.  No contraindications to surgery at this time.  Unfortunately chemistry still pending at the time of this note.  Patient is a moderate risk for surgery with his medical issues but surgery must be done or else he just be lying in bed for 6 months with being high  risk for DVT, pneumonia and decubitus ulcers.  Mortality is high within the first year of hip fracture. 2.  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  We will put on sliding scale insulin.  Hold oral medications.  Continue to monitor kidney function. 3.  History of CAD and peripheral vascular disease.  Hold aspirin and Plavix for surgery.  Patient is already on bisoprolol and atorvastatin 4.  Leukocytosis.  Could be reactive in nature.  Patient finished up a recent Keflex after having sutures.  Will check a urinalysis. 5.  Essential hypertension on Lotrel and Ziac. 6.  Hyperlipidemia on atorvastatin 7.  Hypothyroidism unspecified on Synthroid 8.  BPH on Flomax  9.  Patient is a DO NOT RESUSCITATE  All the records are reviewed and case discussed with ED provider. Management plans discussed with the patient and he is in agreement.  Tried to call the patient's brother and left a message with his brother's wife.  CODE STATUS: DNR  TOTAL TIME TAKING CARE OF THIS PATIENT: 50 minutes.    Loletha Grayer M.D on 12/27/2019 at 5:53 PM  Between 7am to 6pm - Pager - [phone]  After 6pm call admission pager [phone]  [facility]  CC: Primary care physician; [facility], [facility]

## 2019-12-27 NOTE — ED Provider Notes (Addendum)
[facility]    CSN: [id number] Arrival date & time: 12/27/19  1448      History   Chief Complaint Chief Complaint  Patient presents with  . Hip Pain   HPI   79 year old male presents with hip pain after suffering a fall.  Patient states that he fell over the curb this afternoon.  He was headed to the cancer center.  He did not make it to his visit.  Patient reports moderate pain particularly with movement of his right lower extremity.  Localizes the pain to the right hip.  Patient also fell and injured his right elbow but states that this is not particularly bothersome.  No appreciable bruising.  Patient states that he is unsure why he was advised to go to the cancer center.  Worse with range of motion.  No relieving factors.  No other complaints.  PMH, Surgical Hx, Family Hx, Social History reviewed and updated as below.  Past Medical History:  Diagnosis Date  . CAD (coronary artery disease)   . 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 Diabetes mellitus without complication ([place])   . Hypercholesterolemia   . Hypertension   . Hypothyroid   . Venous insufficiency    Patient Active Problem List   Diagnosis Date Noted  . 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  Home Medications    Prior to Admission medications   Medication Sig Start Date End Date Taking? Authorizing Provider  amLODipine-benazepril (LOTREL) 10-40 MG capsule Take by mouth. 12/23/16  Yes [provider]  aspirin EC 81 MG tablet Take 81 mg by mouth daily.   Yes [provider]  atorvastatin (LIPITOR) 40 MG tablet Take 40 mg by mouth daily.   Yes [provider]  bisoprolol-hydrochlorothiazide (ZIAC) 10-6.25 MG tablet Take 1 tablet by mouth daily.   Yes [provider]  cetirizine (ZYRTEC) 10 MG tablet Take by mouth.   Yes [provider]  Cholecalciferol 25 MCG (1000 UT) capsule Take by mouth.   Yes [provider]  clopidogrel (PLAVIX) 75 MG tablet Take 75 mg by mouth daily.   Yes [provider]  Coenzyme Q10 10 MG capsule Take by mouth.   Yes [provider]  levothyroxine (SYNTHROID) 75 MCG tablet Take by mouth. 02/19/19  Yes [provider]  linagliptin (TRADJENTA) 5 MG TABS tablet  01/17/18  Yes [provider]  tamsulosin (FLOMAX) 0.4 MG CAPS capsule Take by mouth. 12/24/16  Yes [provider]  cephALEXin (KEFLEX) 500 MG capsule Take 1 capsule (500 mg total) by mouth 3 (three) times daily for 10 days. 12/17/19 12/27/19  Laban Emperor, PA-C    Family History: Coronary artery disease Father    Hypertension Father    Cancer Mother  ovarian  Hypertension Mother    Other Sister  has some kidney issue- not on dialysis  Basal cell carcinoma Neg Hx    Melanoma Neg Hx    Squamous cell carcinoma Neg Hx     Social History Social History   Tobacco Use  . Smoking status: Never Smoker  . Smokeless tobacco: Never Used  Substance Use Topics  . Alcohol use: Never  . Drug use: Never    Allergies   Sulfa antibiotics   Review of Systems Review of Systems  Constitutional: Negative.   Cardiovascular: Positive for leg swelling.  Musculoskeletal:       Right hip pain. Right elbow pain.  All  other systems reviewed and are negative.  Physical Exam Triage Vital Signs ED Triage Vitals  Enc Vitals Group     BP 12/27/19 1516 (!) 150/60     Pulse Rate 12/27/19 1516 70     Resp 12/27/19 1516 18     Temp 12/27/19 1516 97.7 F (36.5 C)     Temp Source 12/27/19 1516 Oral     SpO2 12/27/19 1516 98 %     Weight 12/27/19 1511 234 lb (106.1 kg)     Height 12/27/19 1511 6\' 2"  (1.88 m)     Head Circumference --      Peak Flow --      Pain Score 12/27/19 1510 7     Pain Loc --      Pain Edu? --      Excl. in [place]? --     Updated Vital Signs BP (!) 150/60 (BP Location: Right Arm)   Pulse 70   Temp 97.7 F (36.5 C) (Oral)   Resp  18   Ht 6\' 2"  (1.88 m)   Wt 106.1 kg   SpO2 98%   BMI 30.04 kg/m   Visual Acuity Right Eye Distance:   Left Eye Distance:   Bilateral Distance:    Right Eye Near:   Left Eye Near:    Bilateral Near:     Physical Exam Vitals and nursing note reviewed.  Constitutional:      General: He is not in acute distress.    Appearance: Normal appearance. He is not ill-appearing.  HENT:     Head: Normocephalic and atraumatic.     Nose: Nose normal.     Mouth/Throat:     Mouth: Mucous membranes are moist.  Eyes:     General: No scleral icterus.       Right eye: No discharge.        Left eye: No discharge.     Conjunctiva/sclera: Conjunctivae normal.  Cardiovascular:     Rate and Rhythm: Normal rate and regular rhythm.     Heart sounds: No murmur.  Pulmonary:     Effort: Pulmonary effort is normal.     Breath sounds: Normal breath sounds. No wheezing, rhonchi or rales.  Abdominal:     General: There is no distension.     Palpations: Abdomen is soft.     Tenderness: There is no abdominal tenderness.  Musculoskeletal:     Comments: Patient with significant swelling over the right elbow.  Nontender to palpation.  Right hip - leg externally rotated.  No significant tenderness to palpation.    Skin:    Findings: No rash.     Comments: Bruising noted around the eye from old injury.  Neurological:     Mental Status: He is alert. Mental status is at baseline.     Comments: No appreciable focal neurological deficits.  Psychiatric:        Mood and Affect: Mood normal.        Behavior: Behavior normal.    UC Treatments / Results  Labs (all labs ordered are listed, but only abnormal results are displayed) Labs Reviewed - No data to display  EKG   Radiology DG Elbow Complete Right  Result Date: 12/27/2019 CLINICAL DATA:  Recent fall with right elbow pain, initial encounter EXAM: RIGHT ELBOW - COMPLETE 3+ VIEW COMPARISON:  None. FINDINGS: Considerable degenerative changes of the  elbow joint are noted particularly in the articulation of the humerus and ulna. Soft tissue swelling is noted  posteriorly consistent with the recent injury. No joint effusion is seen. Large olecranon spurs are seen as well as some findings suggestive of olecranon bursitis. IMPRESSION: No acute fracture is noted. Soft tissue changes are seen consistent with the given clinical history. Electronically Signed   By: Inez Catalina M.D.   On: 12/27/2019 16:16   DG Hip Unilat W or Wo Pelvis 2-3 Views Right  Result Date: 12/27/2019 CLINICAL DATA:  Fall. EXAM: DG HIP (WITH OR WITHOUT PELVIS) 2-3V RIGHT COMPARISON:  No recent. FINDINGS: Diffuse severe osteopenia and degenerative change. Angulated fracture of the right femoral neck is noted. No evidence of dislocation. IMPRESSION: 1.  Angulated right femoral neck fracture. 2.  Diffuse severe osteopenia degenerative change. Electronically Signed   By: Marcello Moores  Register   On: 12/27/2019 16:16    Procedures Procedures (including critical care time)  Medications Ordered in UC Medications - No data to display  Initial Impression / Assessment and Plan / UC Course  I have reviewed the triage vital signs and the nursing notes.  Pertinent labs & imaging results that were available during my care of the patient were reviewed by me and considered in my medical decision making (see chart for details).    79 year old male presents with an angulated right femoral neck fracture after suffering a fall.  Patient going via EMS to the hospital.  Patient needs urgent evaluation and surgical intervention regarding his fracture.  Final Clinical Impressions(s) / UC Diagnoses   Final diagnoses:  Closed fracture of neck of right femur, initial encounter [facility])   Discharge Instructions   None    ED Prescriptions    None     PDMP not reviewed this encounter.     Coral Spikes, [place] 12/27/19 1636

## 2019-12-27 NOTE — ED Triage Notes (Signed)
Pt fell while at [facility]. Fell on right hip.  Right leg shorter than left. Pt denies hitting head or LOC. Pt with old lacerations on right forehead from fall over 1 week ago.

## 2019-12-27 NOTE — ED Notes (Signed)
72F foley inserted with some difficulty

## 2019-12-27 NOTE — ED Provider Notes (Signed)
[facility] Emergency Department Provider Note    First MD Initiated Contact with Patient 12/27/19 1707     (approximate)  I have reviewed the triage vital signs and the nursing notes.   HISTORY  Chief Complaint Fall    HPI JCEON HARGENS is a 79 y.o. male presents the ER for evaluation of acute right hip pain and inability to bear weight on the right.  Patient was seen at urgent care.  Is presenting to cancer center.  Patient does not have any diagnosis of malignancy not on chemotherapy is on Plavix for history of CAD.  States that he tripped while trying to get up on the curb falling on his right hip.  Did not hit his head.  Did not hit his elbow.  X-ray does show evidence of right femoral neck fracture.  Patient's pain is mild to moderate at this time.    Past Medical History:  Diagnosis Date  . CAD (coronary artery disease)   . 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 Diabetes mellitus without complication ([place])   . Hypercholesterolemia   . Hypertension   . Hypothyroid   . Venous insufficiency    History reviewed. No pertinent family history. History reviewed. No pertinent surgical history. Patient Active Problem List   Diagnosis Date Noted  . 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 Authorizing Provider  amLODipine-benazepril (LOTREL) 10-40 MG capsule Take by mouth. 12/23/16   [provider]  aspirin EC 81 MG tablet Take 81 mg by mouth daily.    [provider]  atorvastatin (LIPITOR) 40 MG tablet Take 40 mg by mouth daily.    [provider]  bisoprolol-hydrochlorothiazide (ZIAC) 10-6.25 MG tablet Take 1 tablet by mouth daily.    [provider]  cephALEXin (KEFLEX) 500 MG capsule Take 1 capsule (500 mg total) by mouth 3 (three) times daily for 10 days. 12/17/19 12/27/19  Laban Emperor, PA-C  cetirizine (ZYRTEC) 10  MG tablet Take by mouth.    [provider]  Cholecalciferol 25 MCG (1000 UT) capsule Take by mouth.    [provider]  clopidogrel (PLAVIX) 75 MG tablet Take 75 mg by mouth daily.    [provider]  Coenzyme Q10 10 MG capsule Take by mouth.    [provider]  levothyroxine (SYNTHROID) 75 MCG tablet Take by mouth. 02/19/19   [provider]  linagliptin (TRADJENTA) 5 MG TABS tablet  01/17/18   [provider]  tamsulosin (FLOMAX) 0.4 MG CAPS capsule Take by mouth. 12/24/16   [provider]    Allergies Sulfa antibiotics    Social History Social History   Tobacco Use  . Smoking status: Never Smoker  . Smokeless tobacco: Never Used  Substance Use Topics  . Alcohol use: Never  . Drug use: Never    Review of Systems Patient denies headaches, rhinorrhea, blurry vision, numbness, shortness of breath, chest pain, edema, cough, abdominal pain, nausea, vomiting, diarrhea, dysuria, fevers, rashes or hallucinations unless otherwise stated above in HPI. ____________________________________________   PHYSICAL EXAM:  VITAL SIGNS: Vitals:   12/27/19 1710  BP: (!) 147/70  Pulse: 69  Resp: 18  Temp: (!) 97.5 F (36.4 C)  SpO2: 98%    Constitutional: Alert and oriented.  Eyes: Conjunctivae are normal.  Head: Atraumatic. Nose: No congestion/rhinnorhea. Mouth/Throat: Mucous membranes are moist.  Neck: No stridor. Painless ROM.  Cardiovascular: Normal rate, regular rhythm. Grossly normal heart sounds.  Good peripheral circulation. Respiratory: Normal respiratory effort.  No retractions. Lungs CTAB. Gastrointestinal: Soft and nontender. No distention. No abdominal bruits. No CVA tenderness. Genitourinary:  Musculoskeletal: RLE shortened  Compartment soft.  N/V distally. Rue elbow with large contusion. NV intact distally No joint effusions. Neurologic:  Normal speech and language. No gross focal neurologic deficits are  appreciated. No facial droop Skin:  Skin is warm, dry and intact. No rash noted. Psychiatric: Mood and affect are normal. Speech and behavior are normal.  ____________________________________________   LABS (all labs ordered are listed, but only abnormal results are displayed)  No results found for this or any previous visit (from the past 24 hour(s)). ____________________________________________  EKG My review and personal interpretation at Time: 17:29   Indication: preop  Rate: 70  Rhythm: sinus Axis: normal Other: normal sinus, no stemi, IRBBB ____________________________________________  RADIOLOGY  I personally reviewed all radiographic images ordered to evaluate for the above acute complaints and reviewed radiology reports and findings.  These findings were personally discussed with the patient.  Please see medical record for radiology report.  ____________________________________________   PROCEDURES  Procedure(s) performed:  Procedures    Critical Care performed: no ____________________________________________   INITIAL IMPRESSION / ASSESSMENT AND PLAN / ED COURSE  Pertinent labs & imaging results that were available during my care of the patient were reviewed by me and considered in my medical decision making (see chart for details).   DDX: Fracture, dislocation, contusion  LOUDEN POULIOT is a 79 y.o. who presents to the ED with symptoms as described above.  Patient is on Plavix.  No evidence of head or neck trauma.  X-rays show evidence of right femoral neck fracture.  Discussed case with Dr. Mack Guise of orthopedics.  Patient be admitted to hospitalist for further medical management.     The patient was evaluated in Emergency Department today for the symptoms described in the history of present illness. He/she was evaluated in the context of the global COVID-19 pandemic, which necessitated consideration that the patient might be at risk for infection with the  SARS-CoV-2 virus that causes COVID-19. Institutional protocols and algorithms that pertain to the evaluation of patients at risk for COVID-19 are in a state of rapid change based on information released by regulatory bodies including the CDC and federal and state organizations. These policies and algorithms were followed during the patient's care in the ED.  As part of my medical decision making, I reviewed the following data within the [place] notes reviewed and incorporated, Labs reviewed, notes from prior ED visits and [place] Controlled Substance Database   ____________________________________________   FINAL CLINICAL IMPRESSION(S) / ED DIAGNOSES  Final diagnoses:  Closed displaced fracture of right femoral neck ([place])      NEW MEDICATIONS STARTED DURING THIS VISIT:  New Prescriptions   No medications on file     Note:  This document was prepared using Dragon voice recognition software and may include unintentional dictation errors.    Merlyn Lot, MD 12/27/19 [phone]

## 2019-12-27 NOTE — Progress Notes (Deleted)
[facility]  [address] [place], [place] [address] Phone: [phone]  Fax: [phone]   Clinic Day:  12/27/2019  Referring physician: No ref. provider found  Chief Complaint: Grant Mcdaniel is a 79 y.o. male with an abnormal SPEP who is referred in consultation by Dr Franciso Bend for assessment and management.   HPI:  The patient has chronic kidney disease stage G3/A2.  He was seen by Dr. Smith Mince and diagnosed with an abnormal SPEP with mild hypercalcemia on 12/04/2019.   Creatinine was 1.62 and calcium 10.8 on 02/27/2019.  Creatinine has ranged between 1.2 - 1.7 without trend from 12/27/2017 - 08/21/2019.    Calcium has ranged between 10.1 - 11.3 from 02/13/2018 - 08/21/2019.  Hemoglobin was 12.7 on 05/16/2018.  SPEP on 02/27/2019 revealed slight irregularity in the gamma region which may represent a monoclonal protein.  Immunofixation revealed the monoclonal protein was free lambda light chains.  UPEP on 02/27/2019 revealed no significant proteinuria.  Kappa free light chains were 3.69, lambda free light chains 2.33 and a ratio 1.58 (0.26-1.65) on 02/27/2019.  Kappa free light chains were 3.26 with a ratio of 1.66 on 05/16/2018.  Renal ultrasound on 05/23/2018 revealed no hydronephrosis and an enlarged prostate.  He was seen in [facility] ER on 12/17/2019 s/p a fall.  Head CT was negative.  He sustained a right forehead laceration requiring sutures.  He was given Keflex.  Symptomatically, ***   Past Medical History:  Diagnosis Date  . Hypertension     *** The histories are not reviewed yet. Please review them in the "History" navigator section and refresh this [place].  No family history on file.  Social History:  has no history on file for tobacco, alcohol, and drug.The patient is {Blank single:19197::"alone","accompanied by"} *** today.  Allergies:  Allergies  Allergen Reactions  . Sulfa Antibiotics Nausea And Vomiting    Current  Medications: Current Outpatient Medications  Medication Sig Dispense Refill  . aspirin EC 81 MG tablet Take 81 mg by mouth daily.    Marland Kitchen atorvastatin (LIPITOR) 40 MG tablet Take 40 mg by mouth daily.    . bisoprolol-hydrochlorothiazide (ZIAC) 10-6.25 MG tablet Take 1 tablet by mouth daily.    . cephALEXin (KEFLEX) 500 MG capsule Take 1 capsule (500 mg total) by mouth 3 (three) times daily for 10 days. 30 capsule 0  . clopidogrel (PLAVIX) 75 MG tablet Take 75 mg by mouth daily.     No current facility-administered medications for this visit.    ROS  Performance status (ECOG): {CHL ONC IO:2703500938}  Vitals There were no vitals taken for this visit.   Physical Exam  No visits with results within 3 Day(s) from this visit.  Latest known visit with results is:  No results found for any previous visit.    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  SPEP on 02/27/2019 revealed slight irregularity in the gamma region which may represent a monoclonal protein.  Immunofixation revealed the monoclonal protein was free lambda light chains.  UPEP on 02/27/2019 revealed no significant proteinuria.  Kappa free light chains were 3.69, lambda free light chains 2.33 and a ratio 1.58 (0.26-1.65) on 02/27/2019.  Kappa free light chains were 3.26 with a ratio of 1.66 on 05/16/2018.  He has stage G3/A2 chronic kidney disease.  Creatinine has ranged between 1.2 - 1.7 without trend from 12/27/2017 - 08/21/2019.  Calcium  has ranged between 10.1 - 11.3 from 02/13/2018 - 08/21/2019.  Symptomatically, ***  Plan: 1.   Labs today:  CBC with diff, ferritin, iron studies, B12, folate, TSH, myeloma panel, FLCA. 2.   24 hour UPEP and free light chains 3.   Monoclonal gammopathy  Discuss monoclonal gammopathy of unknown significance (MGUS) vs multiple myeloma.  Discuss CRAB criteria;   Hypercalcemia, renal dysfunction, anemia, bone  lesions.  Discuss work-up. 4.   Hypercalcemia  Calcium has ranged between 10.1 - 11.3.  PTH was 40.5 (normal) on 05/16/2018.  Vitamin D 25-OH was 62.8 (normal) on 02/27/2019 and 57.6 (normal) on 05/17/2018.  Check PTHrp, 1,25-dihydroxyvitamin D.  I discussed the assessment and treatment plan with the patient.  The patient was provided an opportunity to ask questions and all were answered.  The patient agreed with the plan and demonstrated an understanding of the instructions.  The patient was advised to call back if the symptoms worsen or if the condition fails to improve as anticipated.  I provided *** minutes (3:00 PM - 3:00 PM) of face-to-face time during this this encounter and > 50% was spent counseling as documented under my assessment and plan.    Tatiyanna Lashley C. Mike Gip, MD, PhD    12/27/2019, 3:00 PM  I, Samul Dada, am acting as scribe for [employer]. Mike Gip, MD, PhD.  {Add scribe attestation statement}

## 2019-12-27 NOTE — ED Triage Notes (Signed)
Patient was walking to the cancer center and fell over the curb landing on his right hip. He is c/o right hip pain.

## 2019-12-27 NOTE — ED Notes (Signed)
Pain meds given  Pt watching tv.  Iv fluids infusing.

## 2019-12-27 NOTE — Consult Note (Signed)
ORTHOPAEDIC CONSULTATION  REQUESTING PHYSICIAN: Loletha Grayer, MD  Chief Complaint: Right hip pain status post fall  HPI: Grant Mcdaniel is a 79 y.o. male who sustained a fall onto his right side after tripping on a curb earlier today.  Patient states he is having pain in his right hip and elbow.  He states he is able to move his elbow without much difficulty but was unable to stand or ambulate after his fall.  Patient also explains he had a fall approximately 2 weeks ago injuring the right side of his face above his right orbit.  Patient has no other complaints after today's fall.  Orthopedics is consulted to manage the displaced right femoral neck hip fracture seen on the patient's x-rays in the emergency department.  Past Medical History:  Diagnosis Date  . CAD (coronary artery disease)   . 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 Diabetes mellitus without complication ([place])   . Hypercholesterolemia   . Hypertension   . Hypertension   . Hypothyroid   . PVD (peripheral vascular disease) ([place])   . Venous insufficiency    Past Surgical History:  Procedure Laterality Date  . galbladder     Social History   Socioeconomic History  . Marital status: Married    Spouse name: Not on file  . Number of children: Not on file  . Years of education: Not on file  . Highest education level: Not on file  Occupational History  . Not on file  Tobacco Use  . Smoking status: Never Smoker  . Smokeless tobacco: Never Used  Substance and Sexual Activity  . Alcohol use: Never  . Drug use: Never  . Sexual activity: Not on file  Other Topics Concern  . Not on file  Social History Narrative  . Not on file   Social Determinants of Health   Financial Resource Strain:   . Difficulty of Paying Living Expenses: Not on file  Food Insecurity:   . Worried About Charity fundraiser in the Last Year: Not on file  . Ran Out of Food in the Last Year: Not on file   Transportation Needs:   . Lack of Transportation (Medical): Not on file  . Lack of Transportation (Non-Medical): Not on file  Physical Activity:   . Days of Exercise per Week: Not on file  . Minutes of Exercise per Session: Not on file  Stress:   . Feeling of Stress : Not on file  Social Connections:   . Frequency of Communication with Friends and Family: Not on file  . Frequency of Social Gatherings with Friends and Family: Not on file  . Attends Religious Services: Not on file  . Active Member of Clubs or Organizations: Not on file  . Attends Archivist Meetings: Not on file  . Marital Status: Not on file   Family History  Problem Relation Age of Onset  . Cancer Mother   . CAD Father    Allergies  Allergen Reactions  . Sulfa Antibiotics Nausea And Vomiting   Prior to Admission medications   Medication Sig Start Date End Date Taking? Authorizing Provider  amLODipine-benazepril (LOTREL) 10-40 MG capsule Take 1 capsule by mouth daily.    Yes [provider]  aspirin EC 81 MG tablet Take 81 mg by mouth at bedtime.    Yes [provider]  atorvastatin (LIPITOR) 40 MG tablet Take 40 mg by mouth at bedtime.  Yes [provider]  bisoprolol-hydrochlorothiazide (ZIAC) 10-6.25 MG tablet Take 1 tablet by mouth daily.   Yes [provider]  cephALEXin (KEFLEX) 500 MG capsule Take 1 capsule (500 mg total) by mouth 3 (three) times daily for 10 days. 12/17/19 12/27/19 Yes Laban Emperor, PA-C  Cholecalciferol 25 MCG (1000 UT) capsule Take 1,000 Units by mouth daily.    Yes [provider]  clopidogrel (PLAVIX) 75 MG tablet Take 75 mg by mouth daily.   Yes [provider]  Coenzyme Q10 10 MG capsule Take 10 mg by mouth daily.    Yes [provider]  fluticasone (FLONASE) 50 MCG/ACT nasal spray Place 1 spray into both nostrils daily.   Yes [provider]  levothyroxine (SYNTHROID) 75 MCG tablet Take 75 mcg by  mouth daily before breakfast.  02/19/19  Yes [provider]  linagliptin (TRADJENTA) 5 MG TABS tablet Take 5 mg by mouth daily.  01/17/18  Yes [provider]  loratadine (CLARITIN) 10 MG tablet Take 10 mg by mouth daily.   Yes [provider]  Multiple Vitamin (MULTIVITAMIN WITH MINERALS) TABS tablet Take 1 tablet by mouth daily.   Yes [provider]  Omega-3 Fatty Acids (FISH OIL TRIPLE STRENGTH) 1400 MG CAPS Take 1 capsule by mouth daily.   Yes [provider]  tamsulosin (FLOMAX) 0.4 MG CAPS capsule Take 0.4 mg by mouth daily.  12/24/16  Yes [provider]   DG Elbow Complete Right  Result Date: 12/27/2019 CLINICAL DATA:  Recent fall with right elbow pain, initial encounter EXAM: RIGHT ELBOW - COMPLETE 3+ VIEW COMPARISON:  None. FINDINGS: Considerable degenerative changes of the elbow joint are noted particularly in the articulation of the humerus and ulna. Soft tissue swelling is noted posteriorly consistent with the recent injury. No joint effusion is seen. Large olecranon spurs are seen as well as some findings suggestive of olecranon bursitis. IMPRESSION: No acute fracture is noted. Soft tissue changes are seen consistent with the given clinical history. Electronically Signed   By: Inez Catalina M.D.   On: 12/27/2019 16:16   DG Chest Portable 1 View  Result Date: 12/27/2019 CLINICAL DATA:  Right hip pain, hypertension EXAM: PORTABLE CHEST 1 VIEW COMPARISON:  None. FINDINGS: Heart size is mildly enlarged. Aortic atherosclerosis. Both lungs are clear. The visualized skeletal structures are unremarkable. IMPRESSION: 1. No active cardiopulmonary disease. 2. Mild cardiomegaly. 3. Aortic atherosclerosis. Electronically Signed   By: Davina Poke D.O.   On: 12/27/2019 17:44   DG Hip Unilat W or Wo Pelvis 2-3 Views Right  Result Date: 12/27/2019 CLINICAL DATA:  Fall. EXAM: DG HIP (WITH OR WITHOUT PELVIS) 2-3V RIGHT COMPARISON:  No recent. FINDINGS:  Diffuse severe osteopenia and degenerative change. Angulated fracture of the right femoral neck is noted. No evidence of dislocation. IMPRESSION: 1.  Angulated right femoral neck fracture. 2.  Diffuse severe osteopenia degenerative change. Electronically Signed   By: Marcello Moores  Register   On: 12/27/2019 16:16    Positive ROS: All other systems have been reviewed and were otherwise negative with the exception of those mentioned in the HPI and as above.  Physical Exam: Patient is evaluated in the emergency department.  General: Alert, no acute distress  MUSCULOSKELETAL: Right lower extremity: Patient skin is intact.  There is no erythema, ecchymosis, but mild to moderate swelling.  The thigh and leg compartments are soft and compressible.  He has palpable pedal pulses, intact sensation light touch and intact motor function distally.  Patient has shortening and external rotation to the right lower extremity.  Assessment: Displaced right femoral neck hip fracture  Plan: I explained to the patient that he has a displaced right femoral neck hip fracture.  I recommended a right hip hemiarthroplasty as treatment for this fracture.  I explained the details of the operation as well as the postoperative course to the patient. I discussed the risks and benefits of surgery. The risks include but are not limited to infection, bleeding requiring blood transfusion, nerve or blood vessel injury, joint stiffness or loss of motion, persistent pain, weakness or instability, leg length discrepancy, change in lower extremity rotation, fracture, dislocation and hardware failure and the need for further surgery. Medical risks include but are not limited to DVT and pulmonary embolism, myocardial infarction, stroke, pneumonia, respiratory failure and death. Patient understood these risks and wished to proceed.   I reviewed the patient's laboratory studies.  A Covid test is pending.  Patient is undergoing preop evaluation and  admission by the hospitalist service.  Patient will be n.p.o. after midnight.  Surgery for right hip hemiarthroplasty is scheduled for tomorrow morning at 7:30 AM pending medical clearance.  Patient did not wish to have any contact any family members at this time.   Thornton Park, MD    12/27/2019 11:29 PM

## 2019-12-28 ENCOUNTER — Encounter
Admission: EM | Disposition: A | Discharge status: Skilled Nursing Facility | Payer: Self-pay | Source: Home / Self Care | Attending: Internal Medicine

## 2019-12-28 ENCOUNTER — Inpatient Hospital Stay: Payer: Medicare Other | Admitting: Anesthesiology

## 2019-12-28 ENCOUNTER — Inpatient Hospital Stay: Payer: Medicare Other

## 2019-12-28 ENCOUNTER — Telehealth: Payer: Self-pay | Admitting: Urology

## 2019-12-28 ENCOUNTER — Encounter: Payer: Self-pay | Admitting: Internal Medicine

## 2019-12-28 DIAGNOSIS — S72001S Fracture of unspecified part of neck of right femur, sequela: Secondary | ICD-10-CM

## 2019-12-28 DIAGNOSIS — T839XXS Unspecified complication of genitourinary prosthetic device, implant and graft, sequela: Secondary | ICD-10-CM

## 2019-12-28 DIAGNOSIS — T839XXA Unspecified complication of genitourinary prosthetic device, implant and graft, initial encounter: Secondary | ICD-10-CM

## 2019-12-28 DIAGNOSIS — D72829 Elevated white blood cell count, unspecified: Secondary | ICD-10-CM

## 2019-12-28 HISTORY — PX: HIP ARTHROPLASTY: SHX981

## 2019-12-28 LAB — GLUCOSE, CAPILLARY
Glucose-Capillary: 170 mg/dL — ABNORMAL HIGH (ref 70–99)
Glucose-Capillary: 178 mg/dL — ABNORMAL HIGH (ref 70–99)
Glucose-Capillary: 196 mg/dL — ABNORMAL HIGH (ref 70–99)
Glucose-Capillary: 202 mg/dL — ABNORMAL HIGH (ref 70–99)
Glucose-Capillary: 148 mg/dL — ABNORMAL HIGH (ref 70–99)

## 2019-12-28 LAB — BASIC METABOLIC PANEL
Anion gap: 11 (ref 5–15)
BUN: 26 mg/dL — ABNORMAL HIGH (ref 8–23)
CO2: 25 mmol/L (ref 22–32)
Calcium: 9.4 mg/dL (ref 8.9–10.3)
Chloride: 102 mmol/L (ref 98–111)
Creatinine, Ser: 1.42 mg/dL — ABNORMAL HIGH (ref 0.61–1.24)
GFR calc Af Amer: 55 mL/min — ABNORMAL LOW (ref >60)
GFR calc non Af Amer: 47 mL/min — ABNORMAL LOW (ref >60)
Glucose, Bld: 153 mg/dL — ABNORMAL HIGH (ref 70–99)
Potassium: 4.3 mmol/L (ref 3.5–5.1)
Sodium: 138 mmol/L (ref 135–145)

## 2019-12-28 LAB — CBC
HCT: 33.5 % — ABNORMAL LOW (ref 39.0–52.0)
Hemoglobin: 11 g/dL — ABNORMAL LOW (ref 13.0–17.0)
MCH: 29.2 pg (ref 26.0–34.0)
MCHC: 32.8 g/dL (ref 30.0–36.0)
MCV: 88.9 fL (ref 80.0–100.0)
Platelets: 148 10*3/uL — ABNORMAL LOW (ref 150–400)
RBC: 3.77 MIL/uL — ABNORMAL LOW (ref 4.22–5.81)
RDW: 14.1 % (ref 11.5–15.5)
WBC: 10.9 10*3/uL — ABNORMAL HIGH (ref 4.0–10.5)
nRBC: 0 % (ref 0.0–0.2)

## 2019-12-28 LAB — SARS CORONAVIRUS 2 BY RT PCR (HOSPITAL ORDER, PERFORMED IN ~~LOC~~ HOSPITAL LAB): SARS Coronavirus 2: NEGATIVE

## 2019-12-28 LAB — ABO/RH: ABO/RH(D): A POS

## 2019-12-28 LAB — SARS CORONAVIRUS 2 (TAT 6-24 HRS): SARS Coronavirus 2: NEGATIVE

## 2019-12-28 SURGERY — HEMIARTHROPLASTY, HIP, DIRECT ANTERIOR APPROACH, FOR FRACTURE
Anesthesia: General | Site: Hip | Laterality: Right

## 2019-12-28 MED ORDER — SENNA 8.6 MG PO TABS
1.0000 | ORAL_TABLET | Freq: Two times a day (BID) | ORAL | Status: DC
  Administered 2019-12-28 – 2020-01-06 (×18): 8.6 mg via ORAL
  Filled 2019-12-28 (×18): qty 1

## 2019-12-28 MED ORDER — ONDANSETRON HCL 4 MG/2ML IJ SOLN: 4.0000 mg | Freq: Once | INTRAMUSCULAR | Status: DC | PRN

## 2019-12-28 MED ORDER — HYDROCODONE-ACETAMINOPHEN 7.5-325 MG PO TABS: 1.0000 | ORAL_TABLET | ORAL | Status: DC | PRN

## 2019-12-28 MED ORDER — AMLODIPINE BESYLATE 10 MG PO TABS
10.0000 mg | ORAL_TABLET | Freq: Every day | ORAL | Status: DC
  Administered 2019-12-29 – 2020-01-01 (×4): 10 mg via ORAL
  Filled 2019-12-28 (×4): qty 1

## 2019-12-28 MED ORDER — FLUTICASONE PROPIONATE 50 MCG/ACT NA SUSP
1.0000 | Freq: Every day | NASAL | Status: DC
  Administered 2019-12-30 – 2020-01-06 (×8): 1 via NASAL
  Filled 2019-12-28 (×2): qty 16

## 2019-12-28 MED ORDER — IOHEXOL 300 MG/ML  SOLN
80.0000 mL | Freq: Once | INTRAMUSCULAR | Status: AC | PRN
  Administered 2019-12-28: 80 mL via INTRAVENOUS
  Filled 2019-12-28: qty 80

## 2019-12-28 MED ORDER — PHENYLEPHRINE HCL (PRESSORS) 10 MG/ML IV SOLN
INTRAVENOUS | Status: DC | PRN
  Administered 2019-12-28: 200 ug via INTRAVENOUS
  Administered 2019-12-28 (×2): 100 ug via INTRAVENOUS

## 2019-12-28 MED ORDER — OXYCODONE HCL 5 MG/5ML PO SOLN: 5.0000 mg | Freq: Once | ORAL | Status: DC | PRN

## 2019-12-28 MED ORDER — PROPOFOL 10 MG/ML IV BOLUS
INTRAVENOUS | Status: AC
  Filled 2019-12-28: qty 20

## 2019-12-28 MED ORDER — LIDOCAINE HCL (PF) 2 % IJ SOLN
INTRAMUSCULAR | Status: AC
  Filled 2019-12-28: qty 10

## 2019-12-28 MED ORDER — PROPOFOL 10 MG/ML IV BOLUS
INTRAVENOUS | Status: DC | PRN
  Administered 2019-12-28: 100 mg via INTRAVENOUS

## 2019-12-28 MED ORDER — EPHEDRINE SULFATE 50 MG/ML IJ SOLN
INTRAMUSCULAR | Status: DC | PRN
  Administered 2019-12-28 (×3): 10 mg via INTRAVENOUS

## 2019-12-28 MED ORDER — POLYETHYLENE GLYCOL 3350 17 G PO PACK: 17.0000 g | PACK | Freq: Every day | ORAL | Status: DC | PRN

## 2019-12-28 MED ORDER — DEXAMETHASONE SODIUM PHOSPHATE 10 MG/ML IJ SOLN
INTRAMUSCULAR | Status: AC
  Filled 2019-12-28: qty 1

## 2019-12-28 MED ORDER — GLYCOPYRROLATE 0.2 MG/ML IJ SOLN
INTRAMUSCULAR | Status: DC | PRN
  Administered 2019-12-28: .2 mg via INTRAVENOUS

## 2019-12-28 MED ORDER — ONDANSETRON HCL 4 MG/2ML IJ SOLN: 4.0000 mg | Freq: Four times a day (QID) | INTRAMUSCULAR | Status: DC | PRN

## 2019-12-28 MED ORDER — SODIUM CHLORIDE 0.9 % IV SOLN: INTRAVENOUS | Status: DC

## 2019-12-28 MED ORDER — CEFAZOLIN SODIUM-DEXTROSE 2-4 GM/100ML-% IV SOLN
INTRAVENOUS | Status: AC
  Filled 2019-12-28: qty 100

## 2019-12-28 MED ORDER — METHOCARBAMOL 1000 MG/10ML IJ SOLN
500.0000 mg | Freq: Four times a day (QID) | INTRAVENOUS | Status: DC | PRN
  Filled 2019-12-28: qty 5

## 2019-12-28 MED ORDER — ROCURONIUM BROMIDE 50 MG/5ML IV SOLN
INTRAVENOUS | Status: AC
  Filled 2019-12-28: qty 1

## 2019-12-28 MED ORDER — CEFAZOLIN SODIUM-DEXTROSE 1-4 GM/50ML-% IV SOLN
1.0000 g | Freq: Four times a day (QID) | INTRAVENOUS | Status: AC
  Administered 2019-12-28 (×2): 1 g via INTRAVENOUS
  Filled 2019-12-28 (×2): qty 50

## 2019-12-28 MED ORDER — FENTANYL CITRATE (PF) 100 MCG/2ML IJ SOLN
INTRAMUSCULAR | Status: AC
  Filled 2019-12-28: qty 2

## 2019-12-28 MED ORDER — METHOCARBAMOL 500 MG PO TABS: 500.0000 mg | ORAL_TABLET | Freq: Four times a day (QID) | ORAL | Status: DC | PRN

## 2019-12-28 MED ORDER — GLYCOPYRROLATE 0.2 MG/ML IJ SOLN
INTRAMUSCULAR | Status: AC
  Filled 2019-12-28: qty 1

## 2019-12-28 MED ORDER — DOCUSATE SODIUM 100 MG PO CAPS
100.0000 mg | ORAL_CAPSULE | Freq: Two times a day (BID) | ORAL | Status: DC
  Administered 2019-12-28 – 2020-01-06 (×17): 100 mg via ORAL
  Filled 2019-12-28 (×17): qty 1

## 2019-12-28 MED ORDER — HYDROCODONE-ACETAMINOPHEN 5-325 MG PO TABS
1.0000 | ORAL_TABLET | ORAL | Status: DC | PRN
  Administered 2019-12-31: 1 via ORAL
  Filled 2019-12-28: qty 1

## 2019-12-28 MED ORDER — MAGNESIUM CITRATE PO SOLN
1.0000 | Freq: Once | ORAL | Status: DC | PRN
  Filled 2019-12-28: qty 296

## 2019-12-28 MED ORDER — BENAZEPRIL HCL 20 MG PO TABS
40.0000 mg | ORAL_TABLET | Freq: Every day | ORAL | Status: DC
  Administered 2019-12-29 – 2020-01-01 (×4): 40 mg via ORAL
  Filled 2019-12-28 (×7): qty 2

## 2019-12-28 MED ORDER — ALUM & MAG HYDROXIDE-SIMETH 200-200-20 MG/5ML PO SUSP: 30.0000 mL | ORAL | Status: DC | PRN

## 2019-12-28 MED ORDER — ONDANSETRON HCL 4 MG PO TABS: 4.0000 mg | ORAL_TABLET | Freq: Four times a day (QID) | ORAL | Status: DC | PRN

## 2019-12-28 MED ORDER — FENTANYL CITRATE (PF) 100 MCG/2ML IJ SOLN: 25.0000 ug | INTRAMUSCULAR | Status: DC | PRN

## 2019-12-28 MED ORDER — SODIUM CHLORIDE 0.9 % IV SOLN
INTRAVENOUS | Status: DC | PRN
  Administered 2019-12-28 (×2): 30 ug/min via INTRAVENOUS

## 2019-12-28 MED ORDER — SODIUM CHLORIDE 0.9 % IV SOLN: INTRAVENOUS | Status: DC | PRN

## 2019-12-28 MED ORDER — ACETAMINOPHEN 500 MG PO TABS
500.0000 mg | ORAL_TABLET | Freq: Four times a day (QID) | ORAL | Status: AC
  Administered 2019-12-28 – 2019-12-29 (×3): 500 mg via ORAL
  Filled 2019-12-28 (×3): qty 1

## 2019-12-28 MED ORDER — DEXAMETHASONE SODIUM PHOSPHATE 10 MG/ML IJ SOLN
INTRAMUSCULAR | Status: DC | PRN
  Administered 2019-12-28: 5 mg via INTRAVENOUS

## 2019-12-28 MED ORDER — SUGAMMADEX SODIUM 200 MG/2ML IV SOLN
INTRAVENOUS | Status: DC | PRN
  Administered 2019-12-28: 200 mg via INTRAVENOUS

## 2019-12-28 MED ORDER — PHENYLEPHRINE HCL (PRESSORS) 10 MG/ML IV SOLN
INTRAVENOUS | Status: AC
  Filled 2019-12-28: qty 1

## 2019-12-28 MED ORDER — ONDANSETRON HCL 4 MG/2ML IJ SOLN
INTRAMUSCULAR | Status: DC | PRN
  Administered 2019-12-28: 4 mg via INTRAVENOUS

## 2019-12-28 MED ORDER — ACETAMINOPHEN 10 MG/ML IV SOLN
INTRAVENOUS | Status: DC | PRN
  Administered 2019-12-28: 1000 mg via INTRAVENOUS

## 2019-12-28 MED ORDER — CHLORHEXIDINE GLUCONATE CLOTH 2 % EX PADS
6.0000 | MEDICATED_PAD | Freq: Every day | CUTANEOUS | Status: DC
  Administered 2019-12-29 – 2020-01-05 (×5): 6 via TOPICAL

## 2019-12-28 MED ORDER — CLOPIDOGREL BISULFATE 75 MG PO TABS
75.0000 mg | ORAL_TABLET | Freq: Every day | ORAL | Status: DC
  Administered 2019-12-29 – 2019-12-31 (×2): 75 mg via ORAL
  Filled 2019-12-28 (×2): qty 1

## 2019-12-28 MED ORDER — ONDANSETRON HCL 4 MG/2ML IJ SOLN
INTRAMUSCULAR | Status: AC
  Filled 2019-12-28: qty 2

## 2019-12-28 MED ORDER — ACETAMINOPHEN 10 MG/ML IV SOLN
INTRAVENOUS | Status: AC
  Filled 2019-12-28: qty 100

## 2019-12-28 MED ORDER — FENTANYL CITRATE (PF) 100 MCG/2ML IJ SOLN
INTRAMUSCULAR | Status: DC | PRN
  Administered 2019-12-28: 50 ug via INTRAVENOUS
  Administered 2019-12-28: 100 ug via INTRAVENOUS

## 2019-12-28 MED ORDER — KETAMINE HCL 50 MG/ML IJ SOLN
INTRAMUSCULAR | Status: AC
  Filled 2019-12-28: qty 10

## 2019-12-28 MED ORDER — SUCCINYLCHOLINE CHLORIDE 20 MG/ML IJ SOLN
INTRAMUSCULAR | Status: DC | PRN
  Administered 2019-12-28: 80 mg via INTRAVENOUS

## 2019-12-28 MED ORDER — LIDOCAINE HCL (CARDIAC) PF 100 MG/5ML IV SOSY
PREFILLED_SYRINGE | INTRAVENOUS | Status: DC | PRN
  Administered 2019-12-28: 50 mg via INTRAVENOUS

## 2019-12-28 MED ORDER — OXYCODONE HCL 5 MG PO TABS: 5.0000 mg | ORAL_TABLET | Freq: Once | ORAL | Status: DC | PRN

## 2019-12-28 MED ORDER — SUGAMMADEX SODIUM 200 MG/2ML IV SOLN
INTRAVENOUS | Status: AC
  Filled 2019-12-28: qty 2

## 2019-12-28 MED ORDER — ROCURONIUM BROMIDE 100 MG/10ML IV SOLN
INTRAVENOUS | Status: DC | PRN
  Administered 2019-12-28: 20 mg via INTRAVENOUS
  Administered 2019-12-28: 45 mg via INTRAVENOUS
  Administered 2019-12-28: 5 mg via INTRAVENOUS

## 2019-12-28 MED ORDER — BISACODYL 10 MG RE SUPP: 10.0000 mg | Freq: Every day | RECTAL | Status: DC | PRN

## 2019-12-28 MED ORDER — SODIUM CHLORIDE 0.9 % IV SOLN
75.0000 mL/h | INTRAVENOUS | Status: DC
  Administered 2019-12-28: 75 mL/h via INTRAVENOUS

## 2019-12-28 MED ORDER — ACETAMINOPHEN 325 MG PO TABS
325.0000 mg | ORAL_TABLET | Freq: Four times a day (QID) | ORAL | Status: DC | PRN
  Administered 2020-01-02: 650 mg via ORAL
  Filled 2019-12-28: qty 2

## 2019-12-28 MED ORDER — ACETAMINOPHEN 10 MG/ML IV SOLN: 1000.0000 mg | Freq: Once | INTRAVENOUS | Status: DC | PRN

## 2019-12-28 MED ORDER — ASPIRIN EC 325 MG PO TBEC
325.0000 mg | DELAYED_RELEASE_TABLET | Freq: Two times a day (BID) | ORAL | Status: DC
  Administered 2019-12-28 – 2019-12-31 (×5): 325 mg via ORAL
  Filled 2019-12-28 (×5): qty 1

## 2019-12-28 MED ORDER — NEOMYCIN-POLYMYXIN B GU 40-200000 IR SOLN
Status: DC | PRN
  Administered 2019-12-28: 16 mL

## 2019-12-28 MED ORDER — MORPHINE SULFATE (PF) 2 MG/ML IV SOLN
0.5000 mg | INTRAVENOUS | Status: DC | PRN
  Administered 2020-01-01: 1 mg via INTRAVENOUS
  Filled 2019-12-28: qty 1

## 2019-12-28 MED ORDER — SUCCINYLCHOLINE CHLORIDE 20 MG/ML IJ SOLN
INTRAMUSCULAR | Status: AC
  Filled 2019-12-28: qty 1

## 2019-12-28 SURGICAL SUPPLY — 59 items
BLADE SAGITTAL WIDE XTHICK NO (BLADE) ×2 IMPLANT
BLADE SURG SZ10 CARB STEEL (BLADE) ×2 IMPLANT
BNDG COHESIVE 4X5 TAN STRL (GAUZE/BANDAGES/DRESSINGS) ×2 IMPLANT
CANISTER SUCT 1200ML W/VALVE (MISCELLANEOUS) ×2 IMPLANT
CANISTER SUCT 3000ML PPV (MISCELLANEOUS) ×4 IMPLANT
CHLORAPREP W/TINT 26 (MISCELLANEOUS) ×8 IMPLANT
COVER BACK TABLE REUSABLE LG (DRAPES) ×2 IMPLANT
COVER WAND RF STERILE (DRAPES) ×2 IMPLANT
DRAPE 3/4 80X56 (DRAPES) ×6 IMPLANT
DRAPE INCISE IOBAN 66X60 STRL (DRAPES) ×2 IMPLANT
DRAPE SPLIT 6X30 W/TAPE (DRAPES) ×4 IMPLANT
DRAPE SURG 17X11 SM STRL (DRAPES) ×2 IMPLANT
DRSG MEPILEX SACRM 8.7X9.8 (GAUZE/BANDAGES/DRESSINGS) ×2 IMPLANT
DRSG OPSITE POSTOP 4X10 (GAUZE/BANDAGES/DRESSINGS) IMPLANT
DURAPREP 26ML APPLICATOR (WOUND CARE) IMPLANT
ELECT BLADE 6.5 EXT (BLADE) ×2 IMPLANT
ELECT CAUTERY BLADE 6.4 (BLADE) ×2 IMPLANT
ELECT REM PT RETURN 9FT ADLT (ELECTROSURGICAL) ×2
ELECTRODE REM PT RTRN 9FT ADLT (ELECTROSURGICAL) ×1 IMPLANT
GAUZE SPONGE 4X4 12PLY STRL (GAUZE/BANDAGES/DRESSINGS) ×2 IMPLANT
GAUZE XEROFORM 1X8 LF (GAUZE/BANDAGES/DRESSINGS) ×4 IMPLANT
GLOVE BIOGEL PI IND STRL 7.5 (GLOVE) ×2 IMPLANT
GLOVE BIOGEL PI IND STRL 9 (GLOVE) ×1 IMPLANT
GLOVE BIOGEL PI INDICATOR 7.5 (GLOVE) ×2
GLOVE BIOGEL PI INDICATOR 9 (GLOVE) ×1
GLOVE INDICATOR 7.5 STRL GRN (GLOVE) ×4 IMPLANT
GLOVE SURG 9.0 ORTHO LTXF (GLOVE) ×4 IMPLANT
GOWN STRL REUS TWL 2XL XL LVL4 (GOWN DISPOSABLE) ×2 IMPLANT
GOWN STRL REUS W/ TWL LRG LVL3 (GOWN DISPOSABLE) ×3 IMPLANT
GOWN STRL REUS W/TWL LRG LVL3 (GOWN DISPOSABLE) ×3
HEAD MODULAR ENDO (Orthopedic Implant) ×1 IMPLANT
HEAD UNPLR 52XMDLR STRL HIP (Orthopedic Implant) ×1 IMPLANT
HEMOVAC 400ML (MISCELLANEOUS)
KIT DRAIN HEMOVAC JP 7FR 400ML (MISCELLANEOUS) IMPLANT
KIT TURNOVER KIT A (KITS) ×2 IMPLANT
NDL SAFETY ECLIPSE 18X1.5 (NEEDLE) ×1 IMPLANT
NEEDLE FILTER BLUNT 18X 1/2SAF (NEEDLE) ×1
NEEDLE FILTER BLUNT 18X1 1/2 (NEEDLE) ×1 IMPLANT
NEEDLE HYPO 18GX1.5 SHARP (NEEDLE) ×1
NEEDLE MAYO CATGUT SZ4 (NEEDLE) ×2 IMPLANT
NS IRRIG 1000ML POUR BTL (IV SOLUTION) ×2 IMPLANT
PACK HIP PROSTHESIS (MISCELLANEOUS) ×2 IMPLANT
PILLOW ABDUCTION MEDIUM (MISCELLANEOUS) ×2 IMPLANT
PULSAVAC PLUS IRRIG FAN TIP (DISPOSABLE) ×2
RETRIEVER SUT HEWSON (MISCELLANEOUS) IMPLANT
SLEEVE UNITRAX (Orthopedic Implant) ×2 IMPLANT
SOL .9 NS 3000ML IRR  AL (IV SOLUTION) ×1
SOL .9 NS 3000ML IRR UROMATIC (IV SOLUTION) ×1 IMPLANT
STAPLER SKIN PROX 35W (STAPLE) ×2 IMPLANT
STEM 37MM HIP (Hips) ×2 IMPLANT
SUT TICRON 2-0 30IN 311381 (SUTURE) ×10 IMPLANT
SUT VIC AB 0 CT1 36 (SUTURE) ×4 IMPLANT
SUT VIC AB 2-0 CT2 27 (SUTURE) ×4 IMPLANT
SYR 10ML LL (SYRINGE) ×2 IMPLANT
TAPE MICROFOAM 4IN (TAPE) ×2 IMPLANT
TAPE TRANSPORE STRL 2 31045 (GAUZE/BANDAGES/DRESSINGS) ×2 IMPLANT
TIP BRUSH PULSAVAC PLUS 24.33 (MISCELLANEOUS) ×2 IMPLANT
TIP FAN IRRIG PULSAVAC PLUS (DISPOSABLE) ×1 IMPLANT
TUBE SUCT KAM VAC (TUBING) ×2 IMPLANT

## 2019-12-28 NOTE — NC FL2 (Addendum)
[place] LEVEL OF CARE SCREENING TOOL     IDENTIFICATION  Patient Name: Grant Mcdaniel Birthdate: [birth year] Sex: male Admission Date (Current Location): 12/27/2019  [facility] and [place] Number:  Engineering geologist and Address:   [facility] [place] [address]      Provider Number: [id number]  Attending Physician Name and Address:  Loletha Grayer, MD  Relative Name and Phone Number:  Jathniel Polinsky [phone]    Current Level of Care: Hospital Recommended Level of Care: [place] Prior Approval Number:    Date Approved/Denied:   PASRR Number: [id number]  Discharge Plan: SNF    Current Diagnoses: Patient Active Problem List   Diagnosis Date Noted  . Closed right hip fracture, initial encounter ([place]) 12/27/2019  . Preop examination   . CKD stage 3 due to type 2 diabetes mellitus ([place])   . Essential hypertension   . Hyperlipidemia   . Hypothyroidism   . PVD (peripheral vascular disease) ([place])   . Monoclonal gammopathy 12/19/2019    Orientation RESPIRATION BLADDER Height & Weight     Self, Time, Situation, Place  Normal Indwelling catheter(Foley) Weight: 234 lb (106.1 kg) Height:  6\' 2"  (188 cm)  BEHAVIORAL SYMPTOMS/MOOD NEUROLOGICAL BOWEL NUTRITION STATUS      Continent Diet(heart Healthy)  AMBULATORY STATUS COMMUNICATION OF NEEDS Skin   Extensive Assist Verbally Surgical wounds                       Personal Care Assistance Level of Assistance  Bathing, Dressing Bathing Assistance: Limited assistance   Dressing Assistance: Limited assistance     Functional Limitations Info             SPECIAL CARE FACTORS FREQUENCY  PT (By licensed PT)     PT Frequency: 5x week              Contractures Contractures Info: Not present    Additional Factors Info  Code Status, Allergies Code Status Info: DNR Allergies Info: Sulfa Antibiotics            Current Medications (12/28/2019):  This is the current hospital active medication list Current Facility-Administered Medications  Medication Dose Route Frequency Provider Last Rate Last Admin  . 0.9 %  sodium chloride infusion  75 mL/hr Intravenous Continuous Thornton Park, MD      . Derrill Memo ON 12/29/2019] acetaminophen (TYLENOL) tablet 325-650 mg  325-650 mg Oral Q6H PRN Thornton Park, MD      . acetaminophen (TYLENOL) tablet 500 mg  500 mg Oral Q6H Thornton Park, MD      . alum & mag hydroxide-simeth (MAALOX/MYLANTA) 200-200-20 MG/5ML suspension 30 mL  30 mL Oral Q4H PRN Thornton Park, MD      . amLODipine-benazepril (LOTREL) 10-40 MG per capsule 1 capsule  1 capsule Oral Daily Thornton Park, MD      . aspirin EC tablet 325 mg  325 mg Oral BID Thornton Park, MD      . atorvastatin (LIPITOR) tablet 40 mg  40 mg Oral q1800 Thornton Park, MD      . bisacodyl (DULCOLAX) suppository 10 mg  10 mg Rectal Daily PRN Thornton Park, MD      . bisoprolol-hydrochlorothiazide [facility]) 10-6.25 MG per tablet 1 tablet  1 tablet Oral Daily Thornton Park, MD   1 tablet at 12/28/19 0518  . ceFAZolin (ANCEF) IVPB 1 g/50 mL premix  1 g Intravenous Q6H Thornton Park,  MD      . Derrill Memo ON 12/29/2019] Chlorhexidine Gluconate Cloth 2 % PADS 6 each  6 each Topical Daily Loletha Grayer, MD      . Derrill Memo ON 12/29/2019] clopidogrel (PLAVIX) tablet 75 mg  75 mg Oral Daily Thornton Park, MD      . docusate sodium (COLACE) capsule 100 mg  100 mg Oral BID Thornton Park, MD      . fluticasone Asencion Islam) 50 MCG/ACT nasal spray 1 spray  1 spray Each Nare Daily Thornton Park, MD      . HYDROcodone-acetaminophen (NORCO) 7.5-325 MG per tablet 1-2 tablet  1-2 tablet Oral Q4H PRN Thornton Park, MD      . HYDROcodone-acetaminophen (NORCO/VICODIN) 5-325 MG per tablet 1-2 tablet  1-2 tablet Oral Q4H PRN Thornton Park, MD      . insulin aspart (novoLOG) injection 0-5 Units  0-5 Units Subcutaneous QHS  Thornton Park, MD      . insulin aspart (novoLOG) injection 0-9 Units  0-9 Units Subcutaneous TID WC Thornton Park, MD      . levothyroxine (SYNTHROID) tablet 75 mcg  75 mcg Oral Q0600 Thornton Park, MD      . loratadine (CLARITIN) tablet 10 mg  10 mg Oral Daily Thornton Park, MD      . magnesium citrate solution 1 Bottle  1 Bottle Oral Once PRN Thornton Park, MD      . methocarbamol (ROBAXIN) tablet 500 mg  500 mg Oral Q6H PRN Thornton Park, MD       Or  . methocarbamol (ROBAXIN) 500 mg in dextrose 5 % 50 mL IVPB  500 mg Intravenous Q6H PRN Thornton Park, MD      . morphine 2 MG/ML injection 0.5-1 mg  0.5-1 mg Intravenous Q2H PRN Thornton Park, MD      . ondansetron [facility]) tablet 4 mg  4 mg Oral Q6H PRN Thornton Park, MD       Or  . ondansetron [facility]) injection 4 mg  4 mg Intravenous Q6H PRN Thornton Park, MD      . polyethylene glycol (MIRALAX / GLYCOLAX) packet 17 g  17 g Oral Daily PRN Thornton Park, MD      . senna Donavan Burnet) tablet 8.6 mg  1 tablet Oral BID Thornton Park, MD      . tamsulosin (FLOMAX) capsule 0.4 mg  0.4 mg Oral QPC breakfast Thornton Park, MD         Discharge Medications: Please see discharge summary for a list of discharge medications.  Relevant Imaging Results:  Relevant Lab Results:   Additional Information SSN: [id number]  Saidah Kempton, Gardiner Rhyme, LCSW

## 2019-12-28 NOTE — Consult Note (Addendum)
UROLOGY BRIEF CONSULT NOTE  Patient with hip fx, foley placed in ED in anticipation of surgery. Foley was inflated in prostate with significant gross hematuria.  A CT was performed which showed a malpositioned Foley catheter in the urethra and a large 115 g prostate.  There was no hydronephrosis, renal mass, or other concerning urologic findings.  There is no prior cross-sectional imaging to review, however renal ultrasound in June 2019 showed no hydronephrosis.  The patient denies any gross hematuria at baseline.  He reports some difficulty urinating and some urinary incontinence.  He has never been seen by urology before.  He is on Flomax at baseline for urinary symptoms.  Urology procedure: The old Foley catheter was removed and there was some blood at the meatus.  The patient was prepped and draped in standard sterile fashion.  Lidojet was injected into the urethra.  A 20 [place] two-way coud Foley passed easily into the bladder with return of clear yellow urine.  10 cc were placed in the balloon, and the catheter was connected to drainage and secured to the right thigh.  Maintain Foley to drainage Anticoagulation per orthopedic service Continue Flomax We will arrange urology follow-up in 1 week for voiding trial  Nickolas Madrid, MD 12/28/2019

## 2019-12-28 NOTE — Progress Notes (Signed)
Patient ID: Grant Mcdaniel, male   DOB: 1941-07-16, 79 y.o.   MRN: [id number] [facility] PROGRESS NOTE  Grant Mcdaniel [id number] DOB: 28-Feb-1941 DOA: 12/27/2019 PCP: [facility]  HPI/Subjective: Patient feeling well postoperatively.  Some pain in the hip.  No pain in the penis or the prostate area as per patient.  Objective: Vitals:   12/28/19 1158 12/28/19 1234  BP: (!) 116/59 (!) 152/67  Pulse: 74 69  Resp: 16   Temp:  98 F (36.7 C)  SpO2: 96% 98%    Intake/Output Summary (Last 24 hours) at 12/28/2019 1252 Last data filed at 12/28/2019 1126 Gross per 24 hour  Intake 1025 ml  Output 550 ml  Net 475 ml   Filed Weights   12/27/19 1706  Weight: 106.1 kg    ROS: Review of Systems  Constitutional: Negative for chills and fever.  Eyes: Negative for blurred vision.  Respiratory: Negative for cough and shortness of breath.   Cardiovascular: Negative for chest pain.  Gastrointestinal: Negative for abdominal pain, constipation, diarrhea, nausea and vomiting.  Genitourinary: Negative for dysuria.  Musculoskeletal: Negative for joint pain.  Neurological: Negative for dizziness and headaches.   Exam: Physical Exam  Constitutional: He is oriented to person, place, and time.  HENT:  Nose: No mucosal edema.  Mouth/Throat: No oropharyngeal exudate or posterior oropharyngeal edema.  Eyes: Pupils are equal, round, and reactive to light. Conjunctivae, EOM and lids are normal.  Neck: Carotid bruit is not present.  Cardiovascular: S1 normal and S2 normal. Exam reveals no gallop.  No murmur heard. Respiratory: No respiratory distress. He has decreased breath sounds in the right lower field and the left lower field. He has no wheezes. He has no rhonchi. He has no rales.  GI: Soft. Bowel sounds are normal. There is no abdominal tenderness.  Musculoskeletal:     Right ankle: Swelling present.     Left ankle: Swelling present.  Lymphadenopathy:    He has no  cervical adenopathy.  Neurological: He is alert and oriented to person, place, and time. No cranial nerve deficit.  Skin: Skin is warm. No rash noted. Nails show no clubbing.  Psychiatric: He has a normal mood and affect.      Data Reviewed: Basic Metabolic Panel: Recent Labs  Lab 12/27/19 1719 12/28/19 0521  NA 138 138  K 4.6 4.3  CL 101 102  CO2 24 25  GLUCOSE 177* 153*  BUN 26* 26*  CREATININE 1.44* 1.42*  CALCIUM 9.7 9.4   Liver Function Tests: Recent Labs  Lab 12/27/19 1719  AST 37  ALT 16  ALKPHOS 101  BILITOT 1.8*  PROT 7.5  ALBUMIN 3.9  CBC: Recent Labs  Lab 12/27/19 1719 12/28/19 0248  WBC 16.2* 10.9*  NEUTROABS 13.8*  --   HGB 11.9* 11.0*  HCT 38.8* 33.5*  MCV 93.5 88.9  PLT 167 148*   CBG: Recent Labs  Lab 12/27/19 2139 12/28/19 1028 12/28/19 1218  GLUCAP 150* 170* 178*    Recent Results (from the past 240 hour(s))  SARS CORONAVIRUS 2 (TAT 6-24 HRS) Nasopharyngeal Nasopharyngeal Swab     Status: None   Collection Time: 12/27/19  5:19 PM   Specimen: Nasopharyngeal Swab  Result Value Ref Range Status   SARS Coronavirus 2 NEGATIVE NEGATIVE Final    Comment: (NOTE) SARS-CoV-2 target nucleic acids are NOT DETECTED. The SARS-CoV-2 RNA is generally detectable in upper and lower respiratory specimens during the acute phase of infection. Negative results do  not preclude SARS-CoV-2 infection, do not rule out co-infections with other pathogens, and should not be used as the sole basis for treatment or other patient management decisions. Negative results must be combined with clinical observations, patient history, and epidemiological information. The expected result is Negative. Fact Sheet for Patients: SugarRoll.be Fact Sheet for Healthcare Providers: https://www.woods-mathews.com/ This test is not yet approved or cleared by the [place] FDA and  has been authorized for detection and/or diagnosis  of SARS-CoV-2 by FDA under an Emergency Use Authorization (EUA). This EUA will remain  in effect (meaning this test can be used) for the duration of the COVID-19 declaration under Section 56 4(b)(1) of the Act, 21 U.S.C. section 360bbb-3(b)(1), unless the authorization is terminated or revoked sooner. Performed at [facility], [place] [address], [place], [place] [address]   SARS Coronavirus 2 by RT PCR (hospital order, performed in [place] hospital lab)     Status: None   Collection Time: 12/27/19 10:17 PM  Result Value Ref Range Status   SARS Coronavirus 2 NEGATIVE NEGATIVE Final    Comment: (NOTE) SARS-CoV-2 target nucleic acids are NOT DETECTED. The SARS-CoV-2 RNA is generally detectable in upper and lower respiratory specimens during the acute phase of infection. The lowest concentration of SARS-CoV-2 viral copies this assay can detect is 250 copies / mL. A negative result does not preclude SARS-CoV-2 infection and should not be used as the sole basis for treatment or other patient management decisions.  A negative result may occur with improper specimen collection / handling, submission of specimen other than nasopharyngeal swab, presence of viral mutation(s) within the areas targeted by this assay, and inadequate number of viral copies (<250 copies / mL). A negative result must be combined with clinical observations, patient history, and epidemiological information. Fact Sheet for Patients:   StrictlyIdeas.no Fact Sheet for Healthcare Providers: BankingDealers.co.za This test is not yet approved or cleared  by the [place] FDA and has been authorized for detection and/or diagnosis of SARS-CoV-2 by FDA under an Emergency Use Authorization (EUA).  This EUA will remain in effect (meaning this test can be used) for the duration of the COVID-19 declaration under Section 564(b)(1) of the Act, 21 U.S.C. section  360bbb-3(b)(1), unless the authorization is terminated or revoked sooner. Performed at [facility], [address] [place], [place] [address]      Studies: DG Elbow Complete Right  Result Date: 12/27/2019 CLINICAL DATA:  Recent fall with right elbow pain, initial encounter EXAM: RIGHT ELBOW - COMPLETE 3+ VIEW COMPARISON:  None. FINDINGS: Considerable degenerative changes of the elbow joint are noted particularly in the articulation of the humerus and ulna. Soft tissue swelling is noted posteriorly consistent with the recent injury. No joint effusion is seen. Large olecranon spurs are seen as well as some findings suggestive of olecranon bursitis. IMPRESSION: No acute fracture is noted. Soft tissue changes are seen consistent with the given clinical history. Electronically Signed   By: Inez Catalina M.D.   On: 12/27/2019 16:16   CT ABDOMEN PELVIS W CONTRAST  Result Date: 12/28/2019 CLINICAL DATA:  Abdominal trauma with gross hematuria. EXAM: CT ABDOMEN AND PELVIS WITH CONTRAST TECHNIQUE: Multidetector CT imaging of the abdomen and pelvis was performed using the standard protocol following bolus administration of intravenous contrast. CONTRAST:  67mL OMNIPAQUE IOHEXOL 300 MG/ML  SOLN COMPARISON:  None. FINDINGS: LOWER CHEST: No basilar pleural or apical pericardial effusion. HEPATOBILIARY: Normal hepatic contours. No intra- or extrahepatic biliary dilatation. Status  post cholecystectomy. PANCREAS: Normal pancreas. No ductal dilatation or peripancreatic fluid collection. SPLEEN: Normal. ADRENALS/URINARY TRACT: The adrenal glands are normal. There is right-sided retroperitoneal stranding tracking from the right flank along the course of the right ureter, most evident at the level of the right common iliac bifurcation. The left ureter is normal. There is calcification and scarring of the left kidney upper pole. The urinary bladder is normal for degree of distention. There is a Foley catheter with  the balloon inflated within the proximal penile urethra. STOMACH/BOWEL: There is no hiatal hernia. Normal duodenal course and caliber. No small bowel dilatation or inflammation. No focal colonic abnormality. Surgically absent. VASCULAR/LYMPHATIC: There is calcific atherosclerosis of the abdominal aorta. No abdominal or pelvic lymphadenopathy. REPRODUCTIVE: Markedly enlarged prostate measures 6.9 x 5.9 x 6.0 cm. MUSCULOSKELETAL. There is a comminuted fracture of the right femoral neck. There are bridging osteophytes of both sacroiliac joints. There are anterior lumbar vertebral enthesophytes and lower thoracic spinous process enthesophytes. There is moderate-to-severe foraminal stenosis bilaterally at L4-5 and L5-S1. OTHER: None. IMPRESSION: 1. Right-sided retroperitoneal stranding tracking from the right flank along the course of the right ureter, most evident at the level of the right common iliac bifurcation. Given the recent fall, this might be a sequela of trauma. Ascending urinary tract infection might also cause this appearance. 2. Foley catheter balloon is inflated within the proximal penile urethra. Removal is recommended. 3. Comminuted fracture of the right femoral neck. 4. Markedly enlarged prostate. 5. Aortic Atherosclerosis (ICD10-I70.0). Electronically Signed   By: Ulyses Jarred M.D.   On: 12/28/2019 03:42   DG Chest Portable 1 View  Result Date: 12/27/2019 CLINICAL DATA:  Right hip pain, hypertension EXAM: PORTABLE CHEST 1 VIEW COMPARISON:  None. FINDINGS: Heart size is mildly enlarged. Aortic atherosclerosis. Both lungs are clear. The visualized skeletal structures are unremarkable. IMPRESSION: 1. No active cardiopulmonary disease. 2. Mild cardiomegaly. 3. Aortic atherosclerosis. Electronically Signed   By: Davina Poke D.O.   On: 12/27/2019 17:44   DG Hip Unilat W or Wo Pelvis 2-3 Views Right  Result Date: 12/27/2019 CLINICAL DATA:  Fall. EXAM: DG HIP (WITH OR WITHOUT PELVIS) 2-3V RIGHT  COMPARISON:  No recent. FINDINGS: Diffuse severe osteopenia and degenerative change. Angulated fracture of the right femoral neck is noted. No evidence of dislocation. IMPRESSION: 1.  Angulated right femoral neck fracture. 2.  Diffuse severe osteopenia degenerative change. Electronically Signed   By: Marcello Moores  Register   On: 12/27/2019 16:16    Scheduled Meds: . acetaminophen  500 mg Oral Q6H  . amLODipine-benazepril  1 capsule Oral Daily  . aspirin EC  325 mg Oral BID  . atorvastatin  40 mg Oral q1800  . bisoprolol-hydrochlorothiazide  1 tablet Oral Daily  . [START ON 12/29/2019] Chlorhexidine Gluconate Cloth  6 each Topical Daily  . [START ON 12/29/2019] clopidogrel  75 mg Oral Daily  . docusate sodium  100 mg Oral BID  . fluticasone  1 spray Each Nare Daily  . insulin aspart  0-5 Units Subcutaneous QHS  . insulin aspart  0-9 Units Subcutaneous TID WC  . levothyroxine  75 mcg Oral Q0600  . loratadine  10 mg Oral Daily  . senna  1 tablet Oral BID  . tamsulosin  0.4 mg Oral QPC breakfast   Continuous Infusions: . sodium chloride    .  ceFAZolin (ANCEF) IV    . methocarbamol (ROBAXIN) IV      Assessment/Plan:  1. Right hip fracture requiring operative repair.  Pain control.  Likely patient will need rehab. 2. 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  On sliding scale.  Hopefully can go back on oral diabetic meds tomorrow. 3. Essential hypertension continue Lotrel and Ziac. 4. Foley catheter complication with balloon blown up in the prostate.  Foley was removed and urology placed a new catheter.  This catheter will have to be in for 1 week.  Patient already on Flomax. 5. Leukocytosis likely reactive in nature 6. Hypothyroidism unspecified on Synthroid 7. BPH on Flomax 8. History of CAD and peripheral vascular disease.  Looks like orthopedics reordered aspirin and Plavix starting tomorrow.  Code Status:     Code Status Orders  (From admission, onward)          Start     Ordered   12/27/19 1748  Do not attempt resuscitation (DNR)  Continuous    Question Answer Comment  In the event of cardiac or respiratory ARREST Do not call a "code blue"   In the event of cardiac or respiratory ARREST Do not perform Intubation, CPR, defibrillation or ACLS   In the event of cardiac or respiratory ARREST Use medication by any route, position, wound care, and other measures to relive pain and suffering. May use oxygen, suction and manual treatment of airway obstruction as needed for comfort.   Comments nurse may pronounce      12/27/19 1748        Code Status History    This patient has a current code status but no historical code status.   Advance Care Planning Activity     Family Communication: Spoke with brother on the phone on his cell phone [phone] Disposition Plan: Likely out to rehab on Monday  Consultants:  Orthopedic surgery  Time spent: 28 minutes  [place]

## 2019-12-28 NOTE — Telephone Encounter (Signed)
-----   Message from Billey Co, MD sent at 12/28/2019 10:26 AM EST ----- Regarding: follow up Follow up with PA in ~10 days for voiding trial, thanks. Post hospitalization follow up  Nickolas Madrid, MD 12/28/2019

## 2019-12-28 NOTE — ED Notes (Signed)
Hassan Rowan NP at bedside, patient says he has had prostate problems in the past

## 2019-12-28 NOTE — Anesthesia Preprocedure Evaluation (Addendum)
Anesthesia Evaluation  Patient identified by MRN, date of birth, ID band Patient awake    Reviewed: Allergy & Precautions, NPO status , Patient's Chart, lab work & pertinent test results, reviewed documented beta blocker date and time   History of Anesthesia Complications Negative for: history of anesthetic complications  Airway Mallampati: III  TM Distance: >3 FB Neck ROM: Limited   Comment: Somewhat limited neck ROM  Dental no notable dental hx. (+) Teeth Intact, Dental Advisory Given Denies loose teeth:   Pulmonary neg sleep apnea, neg COPD, Patient abstained from smoking.Not current smoker, former smoker,    Pulmonary exam normal breath sounds clear to auscultation       Cardiovascular Exercise Tolerance: Good hypertension, Pt. on medications + CAD, + Cardiac Stents and + Peripheral Vascular Disease   Rhythm:Regular Rate:Normal - Systolic murmurs Stents in heart last in 2004, claims his exercise tolerance was good since then and before his current fall.   Neuro/Psych    GI/Hepatic neg GERD  ,(+)     (-) substance abuse  ,   Endo/Other  diabetes, Type 2Hypothyroidism   Renal/GU Renal InsufficiencyRenal disease     Musculoskeletal   Abdominal   Peds  Hematology   Anesthesia Other Findings Past Medical History: No date: CAD (coronary artery disease) No date: CKD (chronic kidney disease) stage 3, GFR 30-59 ml/min No date: Diabetes mellitus without complication (HCC) No date: Hypercholesterolemia No date: Hypertension No date: Hypertension No date: Hypothyroid No date: PVD (peripheral vascular disease) (HCC) No date: Venous insufficiency   Reproductive/Obstetrics                            Anesthesia Physical Anesthesia Plan  ASA: III  Anesthesia Plan: General   Post-op Pain Management:    Induction: Intravenous  PONV Risk Score and Plan: 3 and Dexamethasone and  Ondansetron  Airway Management Planned: Oral ETT  Additional Equipment: None  Intra-op Plan:   Post-operative Plan: Extubation in OR  Informed Consent: I have reviewed the patients History and Physical, chart, labs and discussed the procedure including the risks, benefits and alternatives for the proposed anesthesia with the patient or authorized representative who has indicated his/her understanding and acceptance.   Patient has DNR.  Discussed DNR with patient and Suspend DNR.   Dental advisory given  Plan Discussed with: CRNA and Surgeon  Anesthesia Plan Comments: (Discussed risks of anesthesia with patient, including PONV, sore throat, lip/dental damage. Rare risks discussed as well, such as cardiorespiratory sequelae and death. Patient understands.  PATIENT TOOK PLAVIX 2 DAYS AGO, WILL DO GENERAL ANESTHESIA DETAILED DISCUSSION OF TEMPORARY SUSPENSION OF DNR DURING PERIOPERATIVE PERIOD. PATIENT AGREES.)       Anesthesia Quick Evaluation

## 2019-12-28 NOTE — Op Note (Signed)
12/28/2019  1:16 PM  PATIENT:  Grant Mcdaniel   MRN: [id number]  PRE-OPERATIVE DIAGNOSIS:  right femoral neck fracture  POST-OPERATIVE DIAGNOSIS:  right femoral neck fracture  PROCEDURE:  Procedure(s): RIGHT HIP HEMIARTHROPLASTY  PREOPERATIVE INDICATIONS:    Grant Mcdaniel is an 79 y.o. male who was admitted with a diagnosis of right femoral neck fracture after a mechanical fall.  I have recommended surgical fixation with hemiarthroplasty for this injury. I have explained the surgery and the postoperative course to the patient who agreed with surgical management of this fracture.    The risks benefits and alternatives were discussed with the patient and their family including but not limited to the risks of  infection requiring removal of the prosthesis, bleeding requiring blood transfusion, nerve injury especially to the sciatic nerve leading to foot drop or lower extremity numbness, periprosthetic fracture, dislocation leg length discrepancy, change in lower extremity rotation persistent hip pain, loosening or failure of the components and the need for revision surgery. Medical risks include but are not limited to DVT and pulmonary embolism, myocardial infarction, stroke, pneumonia, respiratory failure and death.  OPERATIVE REPORT     SURGEON:  Thornton Park, MD    ASSISTANT:  Surgical tech    ANESTHESIA:  General    COMPLICATIONS:  None.   SPECIMEN: Femoral head to pathology    COMPONENTS:  Stryker Accolade 2 femoral component size 7 with a 52 mm -4 neck adjustment sleeve.    PROCEDURE IN DETAIL:   The patient was met in the holding area and  identified.  The appropriate hip was identified and marked at the operative site after verbally confirming with the patient that this was the correct site of surgery.  The patient was then transported to the OR  and  underwent general anesthesia.  The patient was then placed in the lateral decubitus position with the operative side up  and secured on the operating room table with a pegboard and all bony prominences were adequately padded. This included an axillary roll and additional padding around the nonoperative leg to prevent compression to the common peroneal nerve.    The operative lower extremity was prepped and draped in a sterile fashion.  A time out was performed prior to incision to verify patient's name, date of birth, medical record number, correct site of surgery correct procedure to be performed. The timeout was also used to verify the patient received antibiotics now appropriate instruments, implants and radiographic studies were available in the room. Once all in attendance were in agreement case began.  Patient was given 2 g of Ancef prior to the onset of the case.    A posterolateral approach was utilized via sharp dissection carried down to the subcutaneous tissue.  Bleeding vessels were coagulated using electrocautery.  The fascia lata was identified and incised along the length of the skin incision.  The gluteus maximus muscle was then split in line with its fibers. Self-retaining retractors were  inserted.  With the hip internally rotated, the short external rotators  were identified and removed from the posterior attachment from the greater trochanter. The piriformis was tagged for later repair. The capsule was identified and a T-shaped capsulotomy was performed. The capsule was tagged with #2 Tycron for later repair.  The femoral neck fracture was exposed, and the femoral head was removed using a corkscrew device. This was measured to be 52 mm in diameter. The attention was then turned to proximal femur preparation.  An oscillating  saw was used to perform a proximal femoral osteotomy 1 fingerbreadth above the lesser trochanter. The trial 52 mm femoral head was placed into the acetabulum and had an excellent suction fit. The attention was then turned back to femoral preparation.    A femoral skid and Cobra retractor  were placed under the femoral neck to allow for adequate visualization. A box osteotome was used to make the initial entry into the proximal femur. A single hand reamer was used to prepare the femoral canal. A T-shaped femoral canal sounder was then used to ensure no penetration femoral cortex had occurred during reaming. The proximal femur was then sequentially broached by hand. A size 7 femoral trial broach was found to have best medial to lateral canal fit. Once adequate mediolateral canal fill was achieved the trial femoral broach, neck, and head was assembled and the hip was reduced. It was found to have excellent stability, equivalent leg lengths with functional range of motion. The trial components were then removed.  I copiously irrigated the femoral canal and then impacted the actual size 7 femoral prosthesis into place in the appropriate version, slightly anteverted to the normal anatomy.  The 52 mm Unitrax femoral head component with a -4 neck adjustment sleeve was then malleted into place on the trunnion. The hip was then reduced and taken through functional range of motion and found to have excellent stability. Leg lengths were restored. The hip joint was copiously irrigated.   A soft tissue repair of the capsule and external rotators was performed using #2 Tycron Excellent posterior capsular repair was achieved. The fascia lata was then closed with interrupted 0 Vicryl suture. The subcutaneous tissues were closed with 2-0 Vicryl and the skin approximated with staples.   The patient was then placed supine on the operative table. Leg lengths were checked clinically and found to be equivalent. An abduction pillow was placed between the lower extremities. The patient was then transferred to a hospital bed and brought to the PACU in stable condition. I was scrubbed and present the entire case and all sharp and instrument counts were correct at the conclusion of the case. I spoke with the patient's  brother by phone postop to let him know the case was completed without complication patient was stable.   Timoteo Gaul, MD Orthopedic Surgeon

## 2019-12-28 NOTE — ED Notes (Signed)
Patient repositioned in bed for comfort. Foley draining, some blood tinged urine in bag from foley insertion

## 2019-12-28 NOTE — TOC Initial Note (Signed)
Transition of Care [facility]) - Initial/Assessment Note    Patient Details  Name: Grant Mcdaniel MRN: [id number] Date of Birth: [birth year]  Transition of Care [facility]) CM/SW Contact:    Elease Hashimoto, LCSW Phone Number: 12/28/2019, 12:41 PM  Clinical Narrative:     Pt with recent ORIF.  Pass[place] and Fl2 completed in anticipation for discharge. Bed search initiated in anticipation of  discharge early next week.        Expected Discharge Plan: Skilled Nursing Facility Barriers to Discharge: Ship broker, Continued Medical Work up   Patient Goals and CMS Choice        Expected Discharge Plan and Services Expected Discharge Plan: [place] In-house Referral: Clinical Social Work Discharge Planning Services: NA   Living arrangements for the past 2 months: [place]                                      Prior Living Arrangements/Services Living arrangements for the past 2 months: Single Family Home Lives with:: Self Patient language and need for interpreter reviewed:: No        Need for Family Participation in Patient Care: No (Comment) Care giver support system in place?: Yes (comment)   Criminal Activity/Legal Involvement Pertinent to Current Situation/Hospitalization: No - Comment as needed  Activities of Daily Living Home Assistive Devices/Equipment: CBG Meter, Cane (specify quad or straight), Walker (specify type) ADL Screening (condition at time of admission) Patient's cognitive ability adequate to safely complete daily activities?: Yes Is the patient deaf or have difficulty hearing?: No Does the patient have difficulty seeing, even when wearing glasses/contacts?: No Does the patient have difficulty concentrating, remembering, or making decisions?: No Patient able to express need for assistance with ADLs?: Yes Does the patient have difficulty dressing or bathing?: No Independently performs ADLs?: Yes (appropriate for  developmental age) Does the patient have difficulty walking or climbing stairs?: Yes Weakness of Legs: Both Weakness of Arms/Hands: None  Permission Sought/Granted                  Emotional Assessment       Orientation: : Oriented to Self, Oriented to Place, Oriented to  Time, Oriented to Situation   Psych Involvement: No (comment)  Admission diagnosis:  Closed right hip fracture, initial encounter ([place]) [S72.001A] Closed displaced fracture of right femoral neck ([place]) [S72.001A] Patient Active Problem List   Diagnosis Date Noted  . Closed right hip fracture, initial encounter ([place]) 12/27/2019  . Preop examination   . CKD stage 3 due to type 2 diabetes mellitus ([place])   . Essential hypertension   . Hyperlipidemia   . Hypothyroidism   . PVD (peripheral vascular disease) ([place])   . Monoclonal gammopathy 12/19/2019   PCP:  Center, [place]:   [facility] [address]), [place] [address] [place]) [place] [address] Phone: [phone] Fax: [phone]     Social Determinants of Health (SDOH) Interventions    Readmission Risk Interventions No flowsheet data found.

## 2019-12-28 NOTE — Telephone Encounter (Signed)
done

## 2019-12-28 NOTE — ED Notes (Addendum)
Notified on-call NP that patient is having hematuria following difficult catheter insertion. Urine draining but some clots noted  Orders received for CBC and CT

## 2019-12-28 NOTE — Progress Notes (Signed)
Subjective:  POST-OP CHECK s/p right hip hemiarthroplasty.   Patient reports right hip  pain as mild.  No other complaints.  Objective:   VITALS:   Vitals:   12/28/19 1234 12/28/19 1319 12/28/19 1513 12/28/19 1718  BP: (!) 152/67 130/67 (!) 141/47 (!) 119/58  Pulse: 69 69 73 70  Resp:  16 18 20   Temp: 98 F (36.7 C) 98.5 F (36.9 C) 98.2 F (36.8 C) (!) 97.4 F (36.3 C)  TempSrc: Oral   Oral  SpO2: 98% 98% 96% 100%  Weight:      Height:        PHYSICAL EXAM: Right lower extremity Neurovascular intact Sensation intact distally Intact pulses distally Dorsiflexion/Plantar flexion intact Incision: scant drainage No cellulitis present Compartment soft  LABS  Results for orders placed or performed during the hospital encounter of 12/27/19 (from the past 24 hour(s))  Glucose, capillary     Status: Abnormal   Collection Time: 12/27/19  9:39 PM  Result Value Ref Range   Glucose-Capillary 150 (H) 70 - 99 mg/dL  SARS Coronavirus 2 by RT PCR (hospital order, performed in [place] hospital lab)     Status: None   Collection Time: 12/27/19 10:17 PM  Result Value Ref Range   SARS Coronavirus 2 NEGATIVE NEGATIVE  CBC     Status: Abnormal   Collection Time: 12/28/19  2:48 AM  Result Value Ref Range   WBC 10.9 (H) 4.0 - 10.5 K/uL   RBC 3.77 (L) 4.22 - 5.81 MIL/uL   Hemoglobin 11.0 (L) 13.0 - 17.0 g/dL   HCT 33.5 (L) 39.0 - 52.0 %   MCV 88.9 80.0 - 100.0 fL   MCH 29.2 26.0 - 34.0 pg   MCHC 32.8 30.0 - 36.0 g/dL   RDW 14.1 11.5 - 15.5 %   Platelets 148 (L) 150 - 400 K/uL   nRBC 0.0 0.0 - 0.2 %  ABO/Rh     Status: None   Collection Time: 12/28/19  2:48 AM  Result Value Ref Range   ABO/RH(D)      A POS Performed at [facility], [address] [place], [place] [id number]   Basic metabolic panel     Status: Abnormal   Collection Time: 12/28/19  5:21 AM  Result Value Ref Range   Sodium 138 135 - 145 mmol/L   Potassium 4.3 3.5 - 5.1 mmol/L   Chloride 102 98  - 111 mmol/L   CO2 25 22 - 32 mmol/L   Glucose, Bld 153 (H) 70 - 99 mg/dL   BUN 26 (H) 8 - 23 mg/dL   Creatinine, Ser 1.42 (H) 0.61 - 1.24 mg/dL   Calcium 9.4 8.9 - 10.3 mg/dL   GFR calc non Af Amer 47 (L) >60 mL/min   GFR calc Af Amer 55 (L) >60 mL/min   Anion gap 11 5 - 15  Type and screen [place]     Status: None   Collection Time: 12/28/19  7:38 AM  Result Value Ref Range   ABO/RH(D) A POS    Antibody Screen NEG    Sample Expiration      12/31/2019,2359 Performed at [facility], [address] [place], [place] [address]   Glucose, capillary     Status: Abnormal   Collection Time: 12/28/19 10:28 AM  Result Value Ref Range   Glucose-Capillary 170 (H) 70 - 99 mg/dL  Glucose, capillary     Status: Abnormal   Collection Time: 12/28/19  12:18 PM  Result Value Ref Range   Glucose-Capillary 178 (H) 70 - 99 mg/dL  Glucose, capillary     Status: Abnormal   Collection Time: 12/28/19  5:08 PM  Result Value Ref Range   Glucose-Capillary 196 (H) 70 - 99 mg/dL  Glucose, capillary     Status: Abnormal   Collection Time: 12/28/19  5:15 PM  Result Value Ref Range   Glucose-Capillary 202 (H) 70 - 99 mg/dL    DG Elbow Complete Right  Result Date: 12/27/2019 CLINICAL DATA:  Recent fall with right elbow pain, initial encounter EXAM: RIGHT ELBOW - COMPLETE 3+ VIEW COMPARISON:  None. FINDINGS: Considerable degenerative changes of the elbow joint are noted particularly in the articulation of the humerus and ulna. Soft tissue swelling is noted posteriorly consistent with the recent injury. No joint effusion is seen. Large olecranon spurs are seen as well as some findings suggestive of olecranon bursitis. IMPRESSION: No acute fracture is noted. Soft tissue changes are seen consistent with the given clinical history. Electronically Signed   By: Inez Catalina M.D.   On: 12/27/2019 16:16   CT ABDOMEN PELVIS W CONTRAST  Result Date: 12/28/2019 CLINICAL DATA:  Abdominal  trauma with gross hematuria. EXAM: CT ABDOMEN AND PELVIS WITH CONTRAST TECHNIQUE: Multidetector CT imaging of the abdomen and pelvis was performed using the standard protocol following bolus administration of intravenous contrast. CONTRAST:  49mL OMNIPAQUE IOHEXOL 300 MG/ML  SOLN COMPARISON:  None. FINDINGS: LOWER CHEST: No basilar pleural or apical pericardial effusion. HEPATOBILIARY: Normal hepatic contours. No intra- or extrahepatic biliary dilatation. Status post cholecystectomy. PANCREAS: Normal pancreas. No ductal dilatation or peripancreatic fluid collection. SPLEEN: Normal. ADRENALS/URINARY TRACT: The adrenal glands are normal. There is right-sided retroperitoneal stranding tracking from the right flank along the course of the right ureter, most evident at the level of the right common iliac bifurcation. The left ureter is normal. There is calcification and scarring of the left kidney upper pole. The urinary bladder is normal for degree of distention. There is a Foley catheter with the balloon inflated within the proximal penile urethra. STOMACH/BOWEL: There is no hiatal hernia. Normal duodenal course and caliber. No small bowel dilatation or inflammation. No focal colonic abnormality. Surgically absent. VASCULAR/LYMPHATIC: There is calcific atherosclerosis of the abdominal aorta. No abdominal or pelvic lymphadenopathy. REPRODUCTIVE: Markedly enlarged prostate measures 6.9 x 5.9 x 6.0 cm. MUSCULOSKELETAL. There is a comminuted fracture of the right femoral neck. There are bridging osteophytes of both sacroiliac joints. There are anterior lumbar vertebral enthesophytes and lower thoracic spinous process enthesophytes. There is moderate-to-severe foraminal stenosis bilaterally at L4-5 and L5-S1. OTHER: None. IMPRESSION: 1. Right-sided retroperitoneal stranding tracking from the right flank along the course of the right ureter, most evident at the level of the right common iliac bifurcation. Given the recent  fall, this might be a sequela of trauma. Ascending urinary tract infection might also cause this appearance. 2. Foley catheter balloon is inflated within the proximal penile urethra. Removal is recommended. 3. Comminuted fracture of the right femoral neck. 4. Markedly enlarged prostate. 5. Aortic Atherosclerosis (ICD10-I70.0). Electronically Signed   By: Ulyses Jarred M.D.   On: 12/28/2019 03:42   DG Chest Portable 1 View  Result Date: 12/27/2019 CLINICAL DATA:  Right hip pain, hypertension EXAM: PORTABLE CHEST 1 VIEW COMPARISON:  None. FINDINGS: Heart size is mildly enlarged. Aortic atherosclerosis. Both lungs are clear. The visualized skeletal structures are unremarkable. IMPRESSION: 1. No active cardiopulmonary disease. 2. Mild cardiomegaly. 3. Aortic atherosclerosis. Electronically  Signed   By: Davina Poke D.O.   On: 12/27/2019 17:44   DG Knee Left Port  Result Date: 12/28/2019 CLINICAL DATA:  Left knee pain. EXAM: PORTABLE LEFT KNEE - 1-2 VIEW COMPARISON:  None. FINDINGS: No evidence of fracture, dislocation, or joint effusion. Advanced degenerative joint disease identified with marked medial compartment narrowing, subchondral sclerosis and marginal spur formation. No fractures or dislocations. IMPRESSION: 1. Advanced degenerative joint disease. 2. No acute findings. Electronically Signed   By: Kerby Moors M.D.   On: 12/28/2019 11:28   DG Knee Right Port  Result Date: 12/28/2019 CLINICAL DATA:  Right knee pain. EXAM: PORTABLE RIGHT KNEE - 1-2 VIEW COMPARISON:  None. FINDINGS: No acute fracture is identified on this single AP radiograph. There is severe medial compartment joint space narrowing with mild genu varus deformity. Moderate medial and lateral compartment marginal osteophytosis is noted. There is mild diffuse soft tissue swelling. IMPRESSION: 1. Severe medial compartment osteoarthrosis. 2. No acute osseous abnormality identified. Electronically Signed   By: Logan Bores M.D.   On:  12/28/2019 11:42   DG Hip Port Unilat With Pelvis 1V Right  Result Date: 12/28/2019 CLINICAL DATA:  79 year old male with a history of total hip replacement EXAM: DG HIP (WITH OR WITHOUT PELVIS) 1V PORT RIGHT COMPARISON:  12/27/2019 FINDINGS: Interval surgical changes of right hip arthroplasty. Gas and swelling at the surgical bed. Surgical staples project within the soft tissues. Urinary catheter in position. Left hip with degenerative changes.  Bony pelvic ring intact. IMPRESSION: Early surgical changes of right hip arthroplasty without complicating features. Urinary catheter. Electronically Signed   By: Corrie Mckusick D.O.   On: 12/28/2019 11:30   DG Hip Unilat W or Wo Pelvis 2-3 Views Right  Result Date: 12/27/2019 CLINICAL DATA:  Fall. EXAM: DG HIP (WITH OR WITHOUT PELVIS) 2-3V RIGHT COMPARISON:  No recent. FINDINGS: Diffuse severe osteopenia and degenerative change. Angulated fracture of the right femoral neck is noted. No evidence of dislocation. IMPRESSION: 1.  Angulated right femoral neck fracture. 2.  Diffuse severe osteopenia degenerative change. Electronically Signed   By: Marcello Moores  Register   On: 12/27/2019 16:16    Assessment/Plan: Day of Surgery   Active Problems:   Closed hip fracture requiring operative repair, right, sequela   Complication of Foley catheter ([place])   Leukocytosis  Patient stable post-op.  Post-op xrays show good positioning of the prosthesis and no complications.  Continue 24 hours of post-op Kefzol.  Foley d/c in AM.  Labs will be checked tomorrow.   Begin PT in AM.  Patient on plavix and aspirin for DVT prophylaxis.    Thornton Park , MD 12/28/2019, 5:47 PM

## 2019-12-28 NOTE — Anesthesia Procedure Notes (Signed)
Procedure Name: Intubation Performed by: Rolla Plate, CRNA Pre-anesthesia Checklist: Patient identified, Patient being monitored, Timeout performed, Emergency Drugs available and Suction available Patient Re-evaluated:Patient Re-evaluated prior to induction Oxygen Delivery Method: Circle system utilized Preoxygenation: Pre-oxygenation with 100% oxygen Induction Type: IV induction Ventilation: Mask ventilation without difficulty Laryngoscope Size: McGraph and 4 Grade View: Grade II Tube type: Oral Tube size: 7.5 mm Number of attempts: 1 Airway Equipment and Method: Stylet and Video-laryngoscopy Placement Confirmation: ETT inserted through vocal cords under direct vision,  positive ETCO2 and breath sounds checked- equal and bilateral Secured at: 23 cm Tube secured with: Tape Dental Injury: Teeth and Oropharynx as per pre-operative assessment  Difficulty Due To: Difficulty was anticipated and Difficult Airway- due to reduced neck mobility

## 2019-12-28 NOTE — Progress Notes (Signed)
Nurse reports patient with frank hematuria from urethral foley catheter recently placed for preop for hip fracture repair planned today. Bedside exam frank hematuria, not large volume and few small clots noted in drainage bag.  Patient denied pair or discomfort. Noted patient only on plavix for antiplatelet.  CBC repeated and no acute change. CT abdomen and pelvis shows foley catheter coiled in urethra and baloon inflated in prostate.  Dr Sherlyn Lees on call for urology was contacted. He reviewed CT remotly and advised removal of foley catheter and place 20FR 10 cc 2way cathet.   I removed current catheter that demonstrated some back pressure in urethra and large clot follow out of urethra behind catheter.  Also not some steady ooze of more darker blood.  With pericare another moderat sized clot and blood were expressed.  A condom catheter was temporarily placed to monitor for obstruction until appropriate catheter could be obtained.. Dr Sherlyn Lees wll evaluate patient at bedside prior to surgery

## 2019-12-28 NOTE — Transfer of Care (Signed)
Immediate Anesthesia Transfer of Care Note  Patient: Grant Mcdaniel  Procedure(s) Performed: ARTHROPLASTY BIPOLAR HIP (HEMIARTHROPLASTY) (Right Hip)  Patient Location: PACU  Anesthesia Type:General  Level of Consciousness: awake  Airway & Oxygen Therapy: Patient Spontanous Breathing and Patient connected to face mask oxygen  Post-op Assessment: Report given to RN and Post -op Vital signs reviewed and stable  Post vital signs: Reviewed  Last Vitals:  Vitals Value Taken Time  BP 160/66 12/28/19 1023  Temp    Pulse 73 12/28/19 1024  Resp 12 12/28/19 1023  SpO2 100 % 12/28/19 1024  Vitals shown include unvalidated device data.  Last Pain:  Vitals:   12/28/19 0736  TempSrc: Tympanic  PainSc: 4          Complications: No apparent anesthesia complications

## 2019-12-28 NOTE — ED Notes (Signed)
Patient transported to CT 

## 2019-12-28 NOTE — Anesthesia Postprocedure Evaluation (Signed)
Anesthesia Post Note  Patient: CHEYTON ENNEN  Procedure(s) Performed: ARTHROPLASTY BIPOLAR HIP (HEMIARTHROPLASTY) (Right Hip)  Patient location during evaluation: PACU Anesthesia Type: General Level of consciousness: awake and alert Pain management: pain level controlled Vital Signs Assessment: post-procedure vital signs reviewed and stable Respiratory status: spontaneous breathing, nonlabored ventilation, respiratory function stable and patient connected to nasal cannula oxygen Cardiovascular status: blood pressure returned to baseline and stable Postop Assessment: no apparent nausea or vomiting Anesthetic complications: no     Last Vitals:  Vitals:   12/28/19 1023 12/28/19 1038  BP: (!) 160/66 (!) 144/63  Pulse: 72 70  Resp: 12 14  Temp: 36.7 C   SpO2: 100% 100%    Last Pain:  Vitals:   12/28/19 1038  TempSrc:   PainSc: 0-No pain                 Arita Miss

## 2019-12-28 NOTE — ED Notes (Signed)
Patient's O2 sats ranging 92-93% on 2L O2. Increased O2 to 3L via nasal cannula.

## 2019-12-29 ENCOUNTER — Inpatient Hospital Stay
Admit: 2019-12-29 | Discharge: 2019-12-29 | Disposition: A | Discharge status: Home / Self Care | Payer: Medicare Other | Attending: Internal Medicine | Admitting: Internal Medicine

## 2019-12-29 ENCOUNTER — Inpatient Hospital Stay: Payer: Medicare Other

## 2019-12-29 DIAGNOSIS — I639 Cerebral infarction, unspecified: Secondary | ICD-10-CM

## 2019-12-29 LAB — BASIC METABOLIC PANEL
Anion gap: 9 (ref 5–15)
BUN: 29 mg/dL — ABNORMAL HIGH (ref 8–23)
CO2: 26 mmol/L (ref 22–32)
Calcium: 8.9 mg/dL (ref 8.9–10.3)
Chloride: 103 mmol/L (ref 98–111)
Creatinine, Ser: 1.44 mg/dL — ABNORMAL HIGH (ref 0.61–1.24)
GFR calc Af Amer: 54 mL/min — ABNORMAL LOW (ref >60)
GFR calc non Af Amer: 47 mL/min — ABNORMAL LOW (ref >60)
Glucose, Bld: 150 mg/dL — ABNORMAL HIGH (ref 70–99)
Potassium: 4.4 mmol/L (ref 3.5–5.1)
Sodium: 138 mmol/L (ref 135–145)

## 2019-12-29 LAB — GLUCOSE, CAPILLARY
Glucose-Capillary: 135 mg/dL — ABNORMAL HIGH (ref 70–99)
Glucose-Capillary: 158 mg/dL — ABNORMAL HIGH (ref 70–99)
Glucose-Capillary: 150 mg/dL — ABNORMAL HIGH (ref 70–99)
Glucose-Capillary: 146 mg/dL — ABNORMAL HIGH (ref 70–99)

## 2019-12-29 LAB — CBC
HCT: 24 % — ABNORMAL LOW (ref 39.0–52.0)
Hemoglobin: 7.7 g/dL — ABNORMAL LOW (ref 13.0–17.0)
MCH: 29.1 pg (ref 26.0–34.0)
MCHC: 32.1 g/dL (ref 30.0–36.0)
MCV: 90.6 fL (ref 80.0–100.0)
Platelets: 128 10*3/uL — ABNORMAL LOW (ref 150–400)
RBC: 2.65 MIL/uL — ABNORMAL LOW (ref 4.22–5.81)
RDW: 14.5 % (ref 11.5–15.5)
WBC: 10.2 10*3/uL (ref 4.0–10.5)
nRBC: 0 % (ref 0.0–0.2)

## 2019-12-29 MED ORDER — STROKE: EARLY STAGES OF RECOVERY BOOK: Freq: Once | Status: DC

## 2019-12-29 NOTE — Progress Notes (Signed)
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 Patient currently not available undergoing futher testing at this time. Will attempt again at later time/date as available.)  Janna Arch, PT, DPT   12/29/2019, 1:56 PM

## 2019-12-29 NOTE — Progress Notes (Signed)
*  PRELIMINARY RESULTS* Echocardiogram 2D Echocardiogram has been performed.  Lavell Luster Euretha Najarro 12/29/2019, 2:49 PM

## 2019-12-29 NOTE — Consult Note (Signed)
Referring Physician: Dhungel    Chief Complaint: Gait abnormality  HPI: Grant Mcdaniel is an 79 y.o. male with a history of DM, HTN and HLD who was admitted after a fall with hip pain.  Noted to have fracture.  Underwent surgical intervention on 1/8.  With therapy noted to have some imbalance and gait instability.  Imaging performed.  Patient appears to have been having some difficulty with gait preceding this admission.  He reports having trouble on and off for the past year.  Had a fall about 2 weeks ago and needing help in the community.   Date last known well: Unable to determine Time last known well: Unable to determine tPA Given: No: Unable to determine LKW  Past Medical History:  Diagnosis Date  . CAD (coronary artery disease)   . 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 Diabetes mellitus without complication ([place])   . Hypercholesterolemia   . Hypertension   . Hypertension   . Hypothyroid   . PVD (peripheral vascular disease) ([place])   . Venous insufficiency     Past Surgical History:  Procedure Laterality Date  . galbladder    . HIP ARTHROPLASTY Right 12/28/2019   Procedure: ARTHROPLASTY BIPOLAR HIP (HEMIARTHROPLASTY);  Surgeon: Thornton Park, MD;  Location: [facility] ORS;  Service: Orthopedics;  Laterality: Right;    Family History  Problem Relation Age of Onset  . Cancer Mother   . CAD Father    Social History:  reports that he has never smoked. He has never used smokeless tobacco. He reports that he does not drink alcohol or use drugs.  Allergies:  Allergies  Allergen Reactions  . Sulfa Antibiotics Nausea And Vomiting    Medications:  I have reviewed the patient's current medications. Prior to Admission:  Medications Prior to Admission  Medication Sig Dispense Refill Last Dose  . amLODipine-benazepril (LOTREL) 10-40 MG capsule Take 1 capsule by mouth daily.    12/27/2019 at Unknown time  . aspirin EC 81 MG tablet Take 81 mg by mouth at bedtime.     12/26/2019 at Unknown time  . atorvastatin (LIPITOR) 40 MG tablet Take 40 mg by mouth at bedtime.    12/26/2019 at Unknown time  . bisoprolol-hydrochlorothiazide (ZIAC) 10-6.25 MG tablet Take 1 tablet by mouth daily.   12/27/2019 at Unknown time  . Cholecalciferol 25 MCG (1000 UT) capsule Take 1,000 Units by mouth daily.    12/27/2019 at Unknown time  . clopidogrel (PLAVIX) 75 MG tablet Take 75 mg by mouth daily.   12/27/2019 at Unknown time  . Coenzyme Q10 10 MG capsule Take 10 mg by mouth daily.    12/27/2019 at Unknown time  . fluticasone (FLONASE) 50 MCG/ACT nasal spray Place 1 spray into both nostrils daily.   12/27/2019 at Unknown time  . levothyroxine (SYNTHROID) 75 MCG tablet Take 75 mcg by mouth daily before breakfast.    12/27/2019 at Unknown time  . linagliptin (TRADJENTA) 5 MG TABS tablet Take 5 mg by mouth daily.    12/27/2019 at Unknown time  . loratadine (CLARITIN) 10 MG tablet Take 10 mg by mouth daily.   12/27/2019 at Unknown time  . Multiple Vitamin (MULTIVITAMIN WITH MINERALS) TABS tablet Take 1 tablet by mouth daily.   12/27/2019 at Unknown time  . Omega-3 Fatty Acids (FISH OIL TRIPLE STRENGTH) 1400 MG CAPS Take 1 capsule by mouth daily.   12/27/2019 at Unknown time  . tamsulosin (FLOMAX) 0.4 MG CAPS capsule Take  0.4 mg by mouth daily.    12/27/2019 at Unknown time   Scheduled: .  stroke: mapping our early stages of recovery book   Does not apply Once  . amLODipine  10 mg Oral Daily   And  . benazepril  40 mg Oral Daily  . aspirin EC  325 mg Oral BID  . atorvastatin  40 mg Oral q1800  . bisoprolol-hydrochlorothiazide  1 tablet Oral Daily  . Chlorhexidine Gluconate Cloth  6 each Topical Daily  . clopidogrel  75 mg Oral Daily  . docusate sodium  100 mg Oral BID  . fluticasone  1 spray Each Nare Daily  . insulin aspart  0-5 Units Subcutaneous QHS  . insulin aspart  0-9 Units Subcutaneous TID WC  . levothyroxine  75 mcg Oral Q0600  . loratadine  10 mg Oral Daily  . senna  1 tablet Oral BID  .  tamsulosin  0.4 mg Oral QPC breakfast    ROS: History obtained from the patient  General ROS: negative for - chills, fatigue, fever, night sweats, weight gain or weight loss Psychological ROS: negative for - behavioral disorder, hallucinations, memory difficulties, mood swings or suicidal ideation Ophthalmic ROS: negative for - blurry vision, double vision, eye pain or loss of vision ENT ROS: negative for - epistaxis, nasal discharge, oral lesions, sore throat, tinnitus or vertigo Allergy and Immunology ROS: negative for - hives or itchy/watery eyes Hematological and Lymphatic ROS: negative for - bleeding problems, bruising or swollen lymph nodes Endocrine ROS: negative for - galactorrhea, hair pattern changes, polydipsia/polyuria or temperature intolerance Respiratory ROS: negative for - cough, hemoptysis, shortness of breath or wheezing Cardiovascular ROS: negative for - chest pain, dyspnea on exertion, edema or irregular heartbeat Gastrointestinal ROS: negative for - abdominal pain, diarrhea, hematemesis, nausea/vomiting or stool incontinence Genito-Urinary ROS: negative for - dysuria, hematuria, incontinence or urinary frequency/urgency Musculoskeletal ROS: hip pain Neurological ROS: as noted in HPI Dermatological ROS: negative for rash and skin lesion changes  Physical Examination: Blood pressure (!) 135/56, pulse 72, temperature 98.3 F (36.8 C), temperature source Oral, resp. rate 19, height 6\' 2"  (1.88 m), weight 106.1 kg, SpO2 91 %.  HEENT-  Normocephalic, no lesions, without obvious abnormality.  Normal external eye and conjunctiva.  Normal TM's bilaterally.  Normal auditory canals and external ears. Normal external nose, mucus membranes and septum.  Normal pharynx. Cardiovascular- S1, S2 normal, pulses palpable throughout   Lungs- chest clear, no wheezing, rales, normal symmetric air entry Abdomen- soft, non-tender; bowel sounds normal; no masses,  no organomegaly Extremities-  s/p surgery Lymph-no adenopathy palpable Musculoskeletal-no joint tenderness, deformity or swelling Skin-warm and dry, no hyperpigmentation, vitiligo, or suspicious lesions  Neurological Examination   Mental Status: Alert and awake.  Speech fluent without evidence of aphasia.  Able to follow 3 step commands without difficulty. Cranial Nerves: II: Visual fields grossly normal, pupils equal, round, reactive to light and accommodation III,IV, VI: ptosis not present, extra-ocular motions intact bilaterally V,VII: smile symmetric, facial light touch sensation normal bilaterally VIII: hearing normal bilaterally IX,X: gag reflex present XI: bilateral shoulder shrug XII: midline tongue extension Motor: 5/5 in the BUE's.  Due to post surgical apparatus, LE not tested extensively, but patient able to move both feet Sensory: Pinprick and light touch intact throughout, bilaterally Deep Tendon Reflexes: Symmetric Plantars: Right: mute   Left: mute Cerebellar: Normal finger-to-nose testing bilaterally Gait: not tested due to safety concerns    Laboratory Studies:  Basic Metabolic Panel: Recent Labs  Lab 12/27/19 1719 12/28/19 0521 12/29/19 0513  NA 138 138 138  K 4.6 4.3 4.4  CL 101 102 103  CO2 24 25 26   GLUCOSE 177* 153* 150*  BUN 26* 26* 29*  CREATININE 1.44* 1.42* 1.44*  CALCIUM 9.7 9.4 8.9    Liver Function Tests: Recent Labs  Lab 12/27/19 1719  AST 37  ALT 16  ALKPHOS 101  BILITOT 1.8*  PROT 7.5  ALBUMIN 3.9   No results for input(s): LIPASE, AMYLASE in the last 168 hours. No results for input(s): AMMONIA in the last 168 hours.  CBC: Recent Labs  Lab 12/27/19 1719 12/28/19 0248 12/29/19 0513  WBC 16.2* 10.9* 10.2  NEUTROABS 13.8*  --   --   HGB 11.9* 11.0* 7.7*  HCT 38.8* 33.5* 24.0*  MCV 93.5 88.9 90.6  PLT 167 148* 128*    Cardiac Enzymes: No results for input(s): CKTOTAL, CKMB, CKMBINDEX, TROPONINI in the last 168 hours.  BNP: Invalid input(s):  POCBNP  CBG: Recent Labs  Lab 12/28/19 1708 12/28/19 1715 12/28/19 2112 12/29/19 0739 12/29/19 1210  GLUCAP 196* 202* 148* 135* 158*    Microbiology: Results for orders placed or performed during the hospital encounter of 12/27/19  SARS CORONAVIRUS 2 (TAT 6-24 HRS) Nasopharyngeal Nasopharyngeal Swab     Status: None   Collection Time: 12/27/19  5:19 PM   Specimen: Nasopharyngeal Swab  Result Value Ref Range Status   SARS Coronavirus 2 NEGATIVE NEGATIVE Final    Comment: (NOTE) SARS-CoV-2 target nucleic acids are NOT DETECTED. The SARS-CoV-2 RNA is generally detectable in upper and lower respiratory specimens during the acute phase of infection. Negative results do not preclude SARS-CoV-2 infection, do not rule out co-infections with other pathogens, and should not be used as the sole basis for treatment or other patient management decisions. Negative results must be combined with clinical observations, patient history, and epidemiological information. The expected result is Negative. Fact Sheet for Patients: SugarRoll.be Fact Sheet for Healthcare Providers: https://www.woods-mathews.com/ This test is not yet approved or cleared by the [place] FDA and  has been authorized for detection and/or diagnosis of SARS-CoV-2 by FDA under an Emergency Use Authorization (EUA). This EUA will remain  in effect (meaning this test can be used) for the duration of the COVID-19 declaration under Section 56 4(b)(1) of the Act, 21 U.S.C. section 360bbb-3(b)(1), unless the authorization is terminated or revoked sooner. Performed at [facility], [place] [address] [place], [place] [address]   SARS Coronavirus 2 by RT PCR (hospital order, performed in [place] hospital lab)     Status: None   Collection Time: 12/27/19 10:17 PM  Result Value Ref Range Status   SARS Coronavirus 2 NEGATIVE NEGATIVE Final    Comment: (NOTE) SARS-CoV-2 target  nucleic acids are NOT DETECTED. The SARS-CoV-2 RNA is generally detectable in upper and lower respiratory specimens during the acute phase of infection. The lowest concentration of SARS-CoV-2 viral copies this assay can detect is 250 copies / mL. A negative result does not preclude SARS-CoV-2 infection and should not be used as the sole basis for treatment or other patient management decisions.  A negative result may occur with improper specimen collection / handling, submission of specimen other than nasopharyngeal swab, presence of viral mutation(s) within the areas targeted by this assay, and inadequate number of viral copies (<250 copies / mL). A negative result must be combined with clinical observations, patient history, and epidemiological information. Fact Sheet for Patients:  StrictlyIdeas.no Fact Sheet for Healthcare Providers: BankingDealers.co.za This test is not yet approved or cleared  by the [place] FDA and has been authorized for detection and/or diagnosis of SARS-CoV-2 by FDA under an Emergency Use Authorization (EUA).  This EUA will remain in effect (meaning this test can be used) for the duration of the COVID-19 declaration under Section 564(b)(1) of the Act, 21 U.S.C. section 360bbb-3(b)(1), unless the authorization is terminated or revoked sooner. Performed at [facility], [address] [place], [place] [address]     Coagulation Studies: No results for input(s): LABPROT, INR in the last 72 hours.  Urinalysis: No results for input(s): COLORURINE, LABSPEC, PHURINE, GLUCOSEU, HGBUR, BILIRUBINUR, KETONESUR, PROTEINUR, UROBILINOGEN, NITRITE, LEUKOCYTESUR in the last 168 hours.  Invalid input(s): APPERANCEUR  Lipid Panel: No results found for: CHOL, TRIG, HDL, CHOLHDL, VLDL, LDLCALC  HgbA1C:  Lab Results  Component Value Date   HGBA1C 7.3 (H) 12/27/2019    Urine Drug Screen:  No results found  for: LABOPIA, COCAINSCRNUR, LABBENZ, AMPHETMU, THCU, LABBARB  Alcohol Level: No results for input(s): ETH in the last 168 hours.  Other results: EKG: normal sinus rhythm at 68 bpm.  Imaging: DG Elbow Complete Right  Result Date: 12/27/2019 CLINICAL DATA:  Recent fall with right elbow pain, initial encounter EXAM: RIGHT ELBOW - COMPLETE 3+ VIEW COMPARISON:  None. FINDINGS: Considerable degenerative changes of the elbow joint are noted particularly in the articulation of the humerus and ulna. Soft tissue swelling is noted posteriorly consistent with the recent injury. No joint effusion is seen. Large olecranon spurs are seen as well as some findings suggestive of olecranon bursitis. IMPRESSION: No acute fracture is noted. Soft tissue changes are seen consistent with the given clinical history. Electronically Signed   By: Inez Catalina M.D.   On: 12/27/2019 16:16   MR BRAIN WO CONTRAST  Result Date: 12/29/2019 CLINICAL DATA:  Acute neuro deficit, stroke suspected. Left leg symptoms. Recent fall sustaining a right femoral neck fracture treated with arthroplasty. EXAM: MRI HEAD WITHOUT CONTRAST TECHNIQUE: Multiplanar, multiecho pulse sequences of the brain and surrounding structures were obtained without intravenous contrast. COMPARISON:  Head CT 12/17/2019 FINDINGS: Brain: There is a 6 mm acute right cerebellar infarct. Small chronic infarcts are present in the cerebellum bilaterally. No intracranial hemorrhage, mass, midline shift, or extra-axial fluid collection is identified. There is moderate cerebral atrophy. A few small foci of T2 hyperintensity in the cerebral white matter are nonspecific and not considered abnormal for age. Vascular: Major intracranial vascular flow voids are preserved. Skull and upper cervical spine: Unremarkable bone marrow signal. Prominent ligamentous thickening posterior to the dens without significant mass effect on the cervicomedullary junction. Sinuses/Orbits: Unremarkable  orbits. Small right maxillary sinus mucous retention cyst. Mild scattered mucosal thickening in the paranasal sinuses, greatest in the left sphenoid sinus. Clear mastoid air cells. Other: None. IMPRESSION: 1. Small acute right cerebellar infarct. 2. Chronic bilateral cerebellar infarcts. Electronically Signed   By: Logan Bores M.D.   On: 12/29/2019 11:48   CT ABDOMEN PELVIS W CONTRAST  Result Date: 12/28/2019 CLINICAL DATA:  Abdominal trauma with gross hematuria. EXAM: CT ABDOMEN AND PELVIS WITH CONTRAST TECHNIQUE: Multidetector CT imaging of the abdomen and pelvis was performed using the standard protocol following bolus administration of intravenous contrast. CONTRAST:  60mL OMNIPAQUE IOHEXOL 300 MG/ML  SOLN COMPARISON:  None. FINDINGS: LOWER CHEST: No basilar pleural or apical pericardial effusion. HEPATOBILIARY: Normal hepatic contours. No intra- or extrahepatic biliary dilatation. Status post cholecystectomy. PANCREAS: Normal  pancreas. No ductal dilatation or peripancreatic fluid collection. SPLEEN: Normal. ADRENALS/URINARY TRACT: The adrenal glands are normal. There is right-sided retroperitoneal stranding tracking from the right flank along the course of the right ureter, most evident at the level of the right common iliac bifurcation. The left ureter is normal. There is calcification and scarring of the left kidney upper pole. The urinary bladder is normal for degree of distention. There is a Foley catheter with the balloon inflated within the proximal penile urethra. STOMACH/BOWEL: There is no hiatal hernia. Normal duodenal course and caliber. No small bowel dilatation or inflammation. No focal colonic abnormality. Surgically absent. VASCULAR/LYMPHATIC: There is calcific atherosclerosis of the abdominal aorta. No abdominal or pelvic lymphadenopathy. REPRODUCTIVE: Markedly enlarged prostate measures 6.9 x 5.9 x 6.0 cm. MUSCULOSKELETAL. There is a comminuted fracture of the right femoral neck. There are  bridging osteophytes of both sacroiliac joints. There are anterior lumbar vertebral enthesophytes and lower thoracic spinous process enthesophytes. There is moderate-to-severe foraminal stenosis bilaterally at L4-5 and L5-S1. OTHER: None. IMPRESSION: 1. Right-sided retroperitoneal stranding tracking from the right flank along the course of the right ureter, most evident at the level of the right common iliac bifurcation. Given the recent fall, this might be a sequela of trauma. Ascending urinary tract infection might also cause this appearance. 2. Foley catheter balloon is inflated within the proximal penile urethra. Removal is recommended. 3. Comminuted fracture of the right femoral neck. 4. Markedly enlarged prostate. 5. Aortic Atherosclerosis (ICD10-I70.0). Electronically Signed   By: Ulyses Jarred M.D.   On: 12/28/2019 03:42   US Carotid Bilateral (at [facility] and [facility] only)  Result Date: 12/29/2019 CLINICAL DATA:  Acute cerebellar infarct EXAM: BILATERAL CAROTID DUPLEX ULTRASOUND TECHNIQUE: Pearline Cables scale imaging, color Doppler and duplex ultrasound were performed of bilateral carotid and vertebral arteries in the neck. COMPARISON:  12/29/2019 FINDINGS: Criteria: Quantification of carotid stenosis is based on velocity parameters that correlate the residual internal carotid diameter with NASCET-based stenosis levels, using the diameter of the distal internal carotid lumen as the denominator for stenosis measurement. The following velocity measurements were obtained: RIGHT ICA: 101/15 cm/sec CCA: 0000000 cm/sec SYSTOLIC ICA/CCA RATIO:  1.3 ECA: 133 cm/sec LEFT ICA: 98/17 cm/sec CCA: 123456 cm/sec SYSTOLIC ICA/CCA RATIO:  1.0 ECA: 127 cm/sec RIGHT CAROTID ARTERY: Minor echogenic shadowing plaque formation. No hemodynamically significant right ICA stenosis, velocity elevation, or turbulent flow. Degree of narrowing less than 50%. RIGHT VERTEBRAL ARTERY:  Antegrade LEFT CAROTID ARTERY: Similar scattered minor echogenic plaque  formation. No hemodynamically significant left ICA stenosis, velocity elevation, or turbulent flow. LEFT VERTEBRAL ARTERY:  Antegrade IMPRESSION: Minor carotid atherosclerosis. No hemodynamically significant ICA stenosis. Degree of narrowing less than 50% bilaterally by ultrasound criteria. Patent antegrade vertebral flow bilaterally Electronically Signed   By: Jerilynn Mages.  Shick M.D.   On: 12/29/2019 13:55   DG Chest Portable 1 View  Result Date: 12/27/2019 CLINICAL DATA:  Right hip pain, hypertension EXAM: PORTABLE CHEST 1 VIEW COMPARISON:  None. FINDINGS: Heart size is mildly enlarged. Aortic atherosclerosis. Both lungs are clear. The visualized skeletal structures are unremarkable. IMPRESSION: 1. No active cardiopulmonary disease. 2. Mild cardiomegaly. 3. Aortic atherosclerosis. Electronically Signed   By: Davina Poke D.O.   On: 12/27/2019 17:44   DG Knee Left Port  Result Date: 12/28/2019 CLINICAL DATA:  Left knee pain. EXAM: PORTABLE LEFT KNEE - 1-2 VIEW COMPARISON:  None. FINDINGS: No evidence of fracture, dislocation, or joint effusion. Advanced degenerative joint disease identified with marked medial compartment narrowing, subchondral sclerosis  and marginal spur formation. No fractures or dislocations. IMPRESSION: 1. Advanced degenerative joint disease. 2. No acute findings. Electronically Signed   By: Kerby Moors M.D.   On: 12/28/2019 11:28   DG Knee Right Port  Result Date: 12/28/2019 CLINICAL DATA:  Right knee pain. EXAM: PORTABLE RIGHT KNEE - 1-2 VIEW COMPARISON:  None. FINDINGS: No acute fracture is identified on this single [facility] radiograph. There is severe medial compartment joint space narrowing with mild genu varus deformity. Moderate medial and lateral compartment marginal osteophytosis is noted. There is mild diffuse soft tissue swelling. IMPRESSION: 1. Severe medial compartment osteoarthrosis. 2. No acute osseous abnormality identified. Electronically Signed   By: Logan Bores M.D.   On:  12/28/2019 11:42   DG Hip Port Unilat With Pelvis 1V Right  Result Date: 12/28/2019 CLINICAL DATA:  79 year old male with a history of total hip replacement EXAM: DG HIP (WITH OR WITHOUT PELVIS) 1V PORT RIGHT COMPARISON:  12/27/2019 FINDINGS: Interval surgical changes of right hip arthroplasty. Gas and swelling at the surgical bed. Surgical staples project within the soft tissues. Urinary catheter in position. Left hip with degenerative changes.  Bony pelvic ring intact. IMPRESSION: Early surgical changes of right hip arthroplasty without complicating features. Urinary catheter. Electronically Signed   By: Corrie Mckusick D.O.   On: 12/28/2019 11:30   DG Hip Unilat W or Wo Pelvis 2-3 Views Right  Result Date: 12/27/2019 CLINICAL DATA:  Fall. EXAM: DG HIP (WITH OR WITHOUT PELVIS) 2-3V RIGHT COMPARISON:  No recent. FINDINGS: Diffuse severe osteopenia and degenerative change. Angulated fracture of the right femoral neck is noted. No evidence of dislocation. IMPRESSION: 1.  Angulated right femoral neck fracture. 2.  Diffuse severe osteopenia degenerative change. Electronically Signed   By: Marcello Moores  Register   On: 12/27/2019 16:16    Assessment: 79 y.o. male admitted after fall with hip fracture.  Noted on further investigation to have acute infarct as well.  MRI of the brain reviewed and shows an acute right cerebellar infarct.  Likely secondary to small vessel disease.  Chronic cerebellar infarcts noted also.  Patient on ASA and Plavix prior to admission.  Carotid dopplers show no evidence of hemodynamically significant stenosis.  Echocardiogram is pending.  A1c 7.3, LDL pending.  Stroke Risk Factors - diabetes mellitus, hyperlipidemia and hypertension  Plan: 1. Fasting lipid panel pending.  Patient on statin.  May need adjustment for target LDL of <70.   2. Blood sugar management with target A1c<7.0 3. Continued therapy 4. Echocardiogram pending 5. Prophylactic therapy-Patient to remain on ASA and  Plavix.  May return to prior dose of 81mg  ASA and 75mg  of Plavix once period deemed necessary for higher dose of ASA per ortho has lapsed.   6. NPO until RN stroke swallow screen 7. Telemetry monitoring 8. Frequent neuro checks   Alexis Goodell, MD Neurology [phone] 12/29/2019, 2:06 PM

## 2019-12-29 NOTE — Progress Notes (Signed)
PROGRESS NOTE                                                                                                                                                                                                             Patient Demographics:    Grant Mcdaniel, is a 79 y.o. male, DOB - [birth year], CR:[id number]  Admit date - 12/27/2019   Admitting Physician Loletha Grayer, MD  Outpatient Primary MD for the patient is Center, [facility]  LOS - 2  Outpatient Specialists: None  Chief Complaint  Patient presents with  . Fall       Brief Narrative 79 year old male with history of coronary artery disease, diabetes mellitus, hypertension, hypothyroidism, PVD, chronic kidney disease stage IIIa presented to the hospital after tripping on his driveway and fell on his right side.  2 weeks back he also had a fall bruising his right eye and forehead.  At that time head CT done was negative for any bleeding or injury.  X-ray done in the ED showed right hip fracture.  Patient admitted to hospital service and underwent right hip hemiarthroplasty on 1/8. On 1/9, when physical therapist was evaluating the patient she noticed patient to have poor coordination in his left leg.  MRI brain was done showing acute small right cerebellar stroke.  Subjective:   Patient seen and examined.  Reports  hip pain to be better.  Denies any weakness in his legs.  Physical therapist during evaluation noted patient to have excessive external rotation of left leg and unable to place the left leg into weightbearing neutral position without assistance and repeatedly was leaning his weight towards the surgical leg.  Left leg strength, reflexes and sensation seemed intact on exam.  An MRI of the brain showed small right cerebellar stroke.   Assessment  & Plan :    Active Problems:   Closed hip fracture requiring operative repair, right, sequela Pain  better controlled.  Continue as needed Vicodin and Robaxin.  Added bowel regimen.  Aspirin 325 mg twice daily for DVT prophylaxis.  Patient already on Plavix. PT recommends SNF.  Acute right cerebellar stroke Noted for impaired coordination on the left lower leg during physical therapy this morning.  Patient had a fall at home hitting his right side of the head 2 weeks back.  At that time head  CT was negative.  MRI done this morning showing right cerebellar stroke.  Stroke work-up initiated.  Patient is out of therapeutic window for TPA as onset of stroke seems to be at least 72 hours. On twice daily full dose aspirin, Plavix already.  Check 2D echo, carotid Doppler and lipid panel.  On statin already.  PT/OT and speech eval.  Neurology consulted.     Complication of Foley catheter [facility]) Patient has BPH on Flomax.  Patient had Foley placed in with balloon blown up into the prostate.  It was removed and urology placed a new catheter and recommends it to be in for 1 week.  Acute postoperative blood loss anemia Hemoglobin dropped to 7.7.  Will monitor closely.  Transfuse if further drop noted  Hypothyroidism Synthroid  History of CAD and peripheral vascular disease Continue home meds  Essential hypertension Stable.  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  Continue sliding scale coverage     Code Status : DNR  Family Communication  : None  Disposition Plan  : SNF possibly on 1/11, pending stroke work-up, neurology evaluation, further improvement in his postoperative course.  Barriers For Discharge : Active symptoms (postoperative, stroke)  Consults  : Orthopedic, neurology  Procedures  : Right hemiarthroplasty, MRI brain  DVT Prophylaxis  :  Lovenox -  Lab Results  Component Value Date   PLT 128 (L) 12/29/2019    Antibiotics    Anti-infectives (From admission, onward)   Start     Dose/Rate Route Frequency Ordered Stop    12/28/19 1400  ceFAZolin (ANCEF) IVPB 1 g/50 mL premix     1 g 100 mL/hr over 30 Minutes Intravenous Every 6 hours 12/28/19 1151 12/28/19 2110   12/28/19 0747  ceFAZolin (ANCEF) 2-4 GM/100ML-% IVPB    Note to Pharmacy: Norton Blizzard  : cabinet override      12/28/19 0747 12/28/19 0827   12/27/19 2019  ceFAZolin (ANCEF) IVPB 2g/100 mL premix     2 g 200 mL/hr over 30 Minutes Intravenous 30 min pre-op 12/27/19 2019 12/28/19 0835        Objective:   Vitals:   12/28/19 2116 12/29/19 0040 12/29/19 0442 12/29/19 0741  BP: (!) 136/54 (!) 136/56 (!) 142/59 (!) 135/56  Pulse: 70 74 75 72  Resp: 18 18 19 19   Temp: 98 F (36.7 C) 98.1 F (36.7 C) 98.4 F (36.9 C) 98.3 F (36.8 C)  TempSrc: Oral Oral Oral Oral  SpO2: 95% 90% 90% 91%  Weight:      Height:        Wt Readings from Last 3 Encounters:  12/27/19 106.1 kg  12/27/19 106.1 kg  12/17/19 104.3 kg     Intake/Output Summary (Last 24 hours) at 12/29/2019 1425 Last data filed at 12/29/2019 0459 Gross per 24 hour  Intake 0 ml  Output 750 ml  Net -750 ml     Physical Exam  Gen: not in distress HEENT:, moist mucosa, supple neck Chest: clear b/l, no added sounds CVS: N S1&S2, no murmurs,  GI: soft, NT, ND, Musculoskeletal: warm, no edema, clean dressing over the right hip CNS: AAOX3, normal strength tone and reflexes in bilateral extremity, impaired coordination on the left leg     Data Review:    CBC Recent Labs  Lab 12/27/19 1719 12/28/19 0248 12/29/19 0513  WBC 16.2* 10.9* 10.2  HGB 11.9* 11.0* 7.7*  HCT 38.8* 33.5* 24.0*  PLT 167  148* 128*  MCV 93.5 88.9 90.6  MCH 28.7 29.2 29.1  MCHC 30.7 32.8 32.1  RDW 14.1 14.1 14.5  LYMPHSABS 1.4  --   --   MONOABS 0.8  --   --   EOSABS 0.0  --   --   BASOSABS 0.0  --   --     Chemistries  Recent Labs  Lab 12/27/19 1719 12/28/19 0521 12/29/19 0513  NA 138 138 138  K 4.6 4.3 4.4  CL 101 102 103  CO2 24 25 26   GLUCOSE 177* 153* 150*  BUN 26* 26*  29*  CREATININE 1.44* 1.42* 1.44*  CALCIUM 9.7 9.4 8.9  AST 37  --   --   ALT 16  --   --   ALKPHOS 101  --   --   BILITOT 1.8*  --   --    ------------------------------------------------------------------------------------------------------------------ No results for input(s): CHOL, HDL, LDLCALC, TRIG, CHOLHDL, LDLDIRECT in the last 72 hours.  Lab Results  Component Value Date   HGBA1C 7.3 (H) 12/27/2019   ------------------------------------------------------------------------------------------------------------------ No results for input(s): TSH, T4TOTAL, T3FREE, THYROIDAB in the last 72 hours.  Invalid input(s): FREET3 ------------------------------------------------------------------------------------------------------------------ No results for input(s): VITAMINB12, FOLATE, FERRITIN, TIBC, IRON, RETICCTPCT in the last 72 hours.  Coagulation profile No results for input(s): INR, PROTIME in the last 168 hours.  No results for input(s): DDIMER in the last 72 hours.  Cardiac Enzymes No results for input(s): CKMB, TROPONINI, MYOGLOBIN in the last 168 hours.  Invalid input(s): CK ------------------------------------------------------------------------------------------------------------------ No results found for: BNP  Inpatient Medications  Scheduled Meds: .  stroke: mapping our early stages of recovery book   Does not apply Once  . amLODipine  10 mg Oral Daily   And  . benazepril  40 mg Oral Daily  . aspirin EC  325 mg Oral BID  . atorvastatin  40 mg Oral q1800  . bisoprolol-hydrochlorothiazide  1 tablet Oral Daily  . Chlorhexidine Gluconate Cloth  6 each Topical Daily  . clopidogrel  75 mg Oral Daily  . docusate sodium  100 mg Oral BID  . fluticasone  1 spray Each Nare Daily  . insulin aspart  0-5 Units Subcutaneous QHS  . insulin aspart  0-9 Units Subcutaneous TID WC  . levothyroxine  75 mcg Oral Q0600  . loratadine  10 mg Oral Daily  . senna  1 tablet Oral  BID  . tamsulosin  0.4 mg Oral QPC breakfast   Continuous Infusions: . methocarbamol (ROBAXIN) IV     PRN Meds:.acetaminophen, alum & mag hydroxide-simeth, bisacodyl, HYDROcodone-acetaminophen, HYDROcodone-acetaminophen, magnesium citrate, methocarbamol **OR** methocarbamol (ROBAXIN) IV, morphine injection, ondansetron **OR** ondansetron (ZOFRAN) IV, polyethylene glycol  Micro Results Recent Results (from the past 240 hour(s))  SARS CORONAVIRUS 2 (TAT 6-24 HRS) Nasopharyngeal Nasopharyngeal Swab     Status: None   Collection Time: 12/27/19  5:19 PM   Specimen: Nasopharyngeal Swab  Result Value Ref Range Status   SARS Coronavirus 2 NEGATIVE NEGATIVE Final    Comment: (NOTE) SARS-CoV-2 target nucleic acids are NOT DETECTED. The SARS-CoV-2 RNA is generally detectable in upper and lower respiratory specimens during the acute phase of infection. Negative results do not preclude SARS-CoV-2 infection, do not rule out co-infections with other pathogens, and should not be used as the sole basis for treatment or other patient management decisions. Negative results must be combined with clinical observations, patient history, and epidemiological information. The expected result is Negative. Fact Sheet for Patients: SugarRoll.be Fact Sheet for Healthcare  Providers: https://www.woods-mathews.com/ This test is not yet approved or cleared by the [place] and  has been authorized for detection and/or diagnosis of SARS-CoV-2 by FDA under an Emergency Use Authorization (EUA). This EUA will remain  in effect (meaning this test can be used) for the duration of the COVID-19 declaration under Section 56 4(b)(1) of the Act, 21 U.S.C. section 360bbb-3(b)(1), unless the authorization is terminated or revoked sooner. Performed at [facility], [place] [address], [place], [place] [address]   SARS Coronavirus 2 by RT PCR (hospital order, performed in  [place] hospital lab)     Status: None   Collection Time: 12/27/19 10:17 PM  Result Value Ref Range Status   SARS Coronavirus 2 NEGATIVE NEGATIVE Final    Comment: (NOTE) SARS-CoV-2 target nucleic acids are NOT DETECTED. The SARS-CoV-2 RNA is generally detectable in upper and lower respiratory specimens during the acute phase of infection. The lowest concentration of SARS-CoV-2 viral copies this assay can detect is 250 copies / mL. A negative result does not preclude SARS-CoV-2 infection and should not be used as the sole basis for treatment or other patient management decisions.  A negative result may occur with improper specimen collection / handling, submission of specimen other than nasopharyngeal swab, presence of viral mutation(s) within the areas targeted by this assay, and inadequate number of viral copies (<250 copies / mL). A negative result must be combined with clinical observations, patient history, and epidemiological information. Fact Sheet for Patients:   StrictlyIdeas.no Fact Sheet for Healthcare Providers: BankingDealers.co.za This test is not yet approved or cleared  by the [place] FDA and has been authorized for detection and/or diagnosis of SARS-CoV-2 by FDA under an Emergency Use Authorization (EUA).  This EUA will remain in effect (meaning this test can be used) for the duration of the COVID-19 declaration under Section 564(b)(1) of the Act, 21 U.S.C. section 360bbb-3(b)(1), unless the authorization is terminated or revoked sooner. Performed at [facility], [address] [place], [place] [address]     Radiology Reports DG Elbow Complete Right  Result Date: 12/27/2019 CLINICAL DATA:  Recent fall with right elbow pain, initial encounter EXAM: RIGHT ELBOW - COMPLETE 3+ VIEW COMPARISON:  None. FINDINGS: Considerable degenerative changes of the elbow joint are noted particularly in the  articulation of the humerus and ulna. Soft tissue swelling is noted posteriorly consistent with the recent injury. No joint effusion is seen. Large olecranon spurs are seen as well as some findings suggestive of olecranon bursitis. IMPRESSION: No acute fracture is noted. Soft tissue changes are seen consistent with the given clinical history. Electronically Signed   By: Inez Catalina M.D.   On: 12/27/2019 16:16   CT Head Wo Contrast  Result Date: 12/17/2019 CLINICAL DATA:  Fall. EXAM: CT HEAD WITHOUT CONTRAST TECHNIQUE: Contiguous axial images were obtained from the base of the skull through the vertex without intravenous contrast. COMPARISON:  None. FINDINGS: Brain: No evidence of acute infarction, hemorrhage, hydrocephalus, extra-axial collection or mass lesion/mass effect. Small remote bilateral cerebellar infarcts. Mild brain atrophy. Vascular: Atherosclerotic calcification Skull: Right forehead laceration.  No calvarial fracture. Sinuses/Orbits: No evidence of injury IMPRESSION: 1. No evidence of intracranial injury. 2. Right forehead laceration without fracture. Electronically Signed   By: Monte Fantasia M.D.   On: 12/17/2019 04:30   CT Cervical Spine Wo Contrast  Addendum Date: 12/17/2019   ADDENDUM REPORT: 12/17/2019 09:36 ADDENDUM: Correction to impression #2, the second sentence should read in the  upper CERVICAL levels there is also posterior longitudinal ligament ossification. Electronically Signed   By: Monte Fantasia M.D.   On: 12/17/2019 09:36   Result Date: 12/17/2019 CLINICAL DATA:  Fall with laceration. EXAM: CT CERVICAL SPINE WITHOUT CONTRAST TECHNIQUE: Multidetector CT imaging of the cervical spine was performed without intravenous contrast. Multiplanar CT image reconstructions were also generated. COMPARISON:  None. FINDINGS: Alignment: Normal Skull base and vertebrae: Negative for fracture. Diffuse idiopathic skeletal hyperostosis with bulky spurring in the upper ventral cervical  spine. There is ankylosis from the occiput to T2. 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 bone island in the C2 body. Soft tissues and spinal canal: No prevertebral fluid or swelling. No visible canal hematoma. Lipoma in the posterior left para median neck the even more simple internal architecture than adjacent fat, up to 3 cm on axial slices. Disc levels:  Spondylitic changes as noted above. Upper chest: Negative IMPRESSION: 1. No acute finding. 2. 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 In the upper thoracic levels there is also posterior longitudinal ligament ossification. Electronically Signed: By: Monte Fantasia M.D. On: 12/17/2019 09:04   MR BRAIN WO CONTRAST  Result Date: 12/29/2019 CLINICAL DATA:  Acute neuro deficit, stroke suspected. Left leg symptoms. Recent fall sustaining a right femoral neck fracture treated with arthroplasty. EXAM: MRI HEAD WITHOUT CONTRAST TECHNIQUE: Multiplanar, multiecho pulse sequences of the brain and surrounding structures were obtained without intravenous contrast. COMPARISON:  Head CT 12/17/2019 FINDINGS: Brain: There is a 6 mm acute right cerebellar infarct. Small chronic infarcts are present in the cerebellum bilaterally. No intracranial hemorrhage, mass, midline shift, or extra-axial fluid collection is identified. There is moderate cerebral atrophy. A few small foci of T2 hyperintensity in the cerebral white matter are nonspecific and not considered abnormal for age. Vascular: Major intracranial vascular flow voids are preserved. Skull and upper cervical spine: Unremarkable bone marrow signal. Prominent ligamentous thickening posterior to the dens without significant mass effect on the cervicomedullary junction. Sinuses/Orbits: Unremarkable orbits. Small right maxillary sinus mucous retention cyst. Mild scattered mucosal thickening in the paranasal sinuses, greatest in the left sphenoid sinus. Clear  mastoid air cells. Other: None. IMPRESSION: 1. Small acute right cerebellar infarct. 2. Chronic bilateral cerebellar infarcts. Electronically Signed   By: Logan Bores M.D.   On: 12/29/2019 11:48   CT ABDOMEN PELVIS W CONTRAST  Result Date: 12/28/2019 CLINICAL DATA:  Abdominal trauma with gross hematuria. EXAM: CT ABDOMEN AND PELVIS WITH CONTRAST TECHNIQUE: Multidetector CT imaging of the abdomen and pelvis was performed using the standard protocol following bolus administration of intravenous contrast. CONTRAST:  19mL OMNIPAQUE IOHEXOL 300 MG/ML  SOLN COMPARISON:  None. FINDINGS: LOWER CHEST: No basilar pleural or apical pericardial effusion. HEPATOBILIARY: Normal hepatic contours. No intra- or extrahepatic biliary dilatation. Status post cholecystectomy. PANCREAS: Normal pancreas. No ductal dilatation or peripancreatic fluid collection. SPLEEN: Normal. ADRENALS/URINARY TRACT: The adrenal glands are normal. There is right-sided retroperitoneal stranding tracking from the right flank along the course of the right ureter, most evident at the level of the right common iliac bifurcation. The left ureter is normal. There is calcification and scarring of the left kidney upper pole. The urinary bladder is normal for degree of distention. There is a Foley catheter with the balloon inflated within the proximal penile urethra. STOMACH/BOWEL: There is no hiatal hernia. Normal duodenal course and caliber. No small bowel dilatation or inflammation. No focal colonic abnormality. Surgically absent. VASCULAR/LYMPHATIC: There is calcific atherosclerosis  of the abdominal aorta. No abdominal or pelvic lymphadenopathy. REPRODUCTIVE: Markedly enlarged prostate measures 6.9 x 5.9 x 6.0 cm. MUSCULOSKELETAL. There is a comminuted fracture of the right femoral neck. There are bridging osteophytes of both sacroiliac joints. There are anterior lumbar vertebral enthesophytes and lower thoracic spinous process enthesophytes. There is  moderate-to-severe foraminal stenosis bilaterally at L4-5 and L5-S1. OTHER: None. IMPRESSION: 1. Right-sided retroperitoneal stranding tracking from the right flank along the course of the right ureter, most evident at the level of the right common iliac bifurcation. Given the recent fall, this might be a sequela of trauma. Ascending urinary tract infection might also cause this appearance. 2. Foley catheter balloon is inflated within the proximal penile urethra. Removal is recommended. 3. Comminuted fracture of the right femoral neck. 4. Markedly enlarged prostate. 5. Aortic Atherosclerosis (ICD10-I70.0). Electronically Signed   By: Ulyses Jarred M.D.   On: 12/28/2019 03:42   US Carotid Bilateral (at [facility] and [facility] only)  Result Date: 12/29/2019 CLINICAL DATA:  Acute cerebellar infarct EXAM: BILATERAL CAROTID DUPLEX ULTRASOUND TECHNIQUE: Pearline Cables scale imaging, color Doppler and duplex ultrasound were performed of bilateral carotid and vertebral arteries in the neck. COMPARISON:  12/29/2019 FINDINGS: Criteria: Quantification of carotid stenosis is based on velocity parameters that correlate the residual internal carotid diameter with NASCET-based stenosis levels, using the diameter of the distal internal carotid lumen as the denominator for stenosis measurement. The following velocity measurements were obtained: RIGHT ICA: 101/15 cm/sec CCA: 0000000 cm/sec SYSTOLIC ICA/CCA RATIO:  1.3 ECA: 133 cm/sec LEFT ICA: 98/17 cm/sec CCA: 123456 cm/sec SYSTOLIC ICA/CCA RATIO:  1.0 ECA: 127 cm/sec RIGHT CAROTID ARTERY: Minor echogenic shadowing plaque formation. No hemodynamically significant right ICA stenosis, velocity elevation, or turbulent flow. Degree of narrowing less than 50%. RIGHT VERTEBRAL ARTERY:  Antegrade LEFT CAROTID ARTERY: Similar scattered minor echogenic plaque formation. No hemodynamically significant left ICA stenosis, velocity elevation, or turbulent flow. LEFT VERTEBRAL ARTERY:  Antegrade IMPRESSION: Minor  carotid atherosclerosis. No hemodynamically significant ICA stenosis. Degree of narrowing less than 50% bilaterally by ultrasound criteria. Patent antegrade vertebral flow bilaterally Electronically Signed   By: Jerilynn Mages.  Shick M.D.   On: 12/29/2019 13:55   DG Chest Portable 1 View  Result Date: 12/27/2019 CLINICAL DATA:  Right hip pain, hypertension EXAM: PORTABLE CHEST 1 VIEW COMPARISON:  None. FINDINGS: Heart size is mildly enlarged. Aortic atherosclerosis. Both lungs are clear. The visualized skeletal structures are unremarkable. IMPRESSION: 1. No active cardiopulmonary disease. 2. Mild cardiomegaly. 3. Aortic atherosclerosis. Electronically Signed   By: Davina Poke D.O.   On: 12/27/2019 17:44   DG Knee Left Port  Result Date: 12/28/2019 CLINICAL DATA:  Left knee pain. EXAM: PORTABLE LEFT KNEE - 1-2 VIEW COMPARISON:  None. FINDINGS: No evidence of fracture, dislocation, or joint effusion. Advanced degenerative joint disease identified with marked medial compartment narrowing, subchondral sclerosis and marginal spur formation. No fractures or dislocations. IMPRESSION: 1. Advanced degenerative joint disease. 2. No acute findings. Electronically Signed   By: Kerby Moors M.D.   On: 12/28/2019 11:28   DG Knee Right Port  Result Date: 12/28/2019 CLINICAL DATA:  Right knee pain. EXAM: PORTABLE RIGHT KNEE - 1-2 VIEW COMPARISON:  None. FINDINGS: No acute fracture is identified on this single AP radiograph. There is severe medial compartment joint space narrowing with mild genu varus deformity. Moderate medial and lateral compartment marginal osteophytosis is noted. There is mild diffuse soft tissue swelling. IMPRESSION: 1. Severe medial compartment osteoarthrosis. 2. No acute osseous abnormality identified. Electronically Signed  By: Logan Bores M.D.   On: 12/28/2019 11:42   DG Hip Port Unilat With Pelvis 1V Right  Result Date: 12/28/2019 CLINICAL DATA:  79 year old male with a history of total hip  replacement EXAM: DG HIP (WITH OR WITHOUT PELVIS) 1V PORT RIGHT COMPARISON:  12/27/2019 FINDINGS: Interval surgical changes of right hip arthroplasty. Gas and swelling at the surgical bed. Surgical staples project within the soft tissues. Urinary catheter in position. Left hip with degenerative changes.  Bony pelvic ring intact. IMPRESSION: Early surgical changes of right hip arthroplasty without complicating features. Urinary catheter. Electronically Signed   By: Corrie Mckusick D.O.   On: 12/28/2019 11:30   DG Hip Unilat W or Wo Pelvis 2-3 Views Right  Result Date: 12/27/2019 CLINICAL DATA:  Fall. EXAM: DG HIP (WITH OR WITHOUT PELVIS) 2-3V RIGHT COMPARISON:  No recent. FINDINGS: Diffuse severe osteopenia and degenerative change. Angulated fracture of the right femoral neck is noted. No evidence of dislocation. IMPRESSION: 1.  Angulated right femoral neck fracture. 2.  Diffuse severe osteopenia degenerative change. Electronically Signed   By: Marcello Moores  Register   On: 12/27/2019 16:16    Time Spent in minutes 35   Janeka Libman M.D on 12/29/2019 at 2:25 PM  Between 7am to 7pm - Pager - [phone]  After 7pm go to [email or web address] - password [facility]  [facility] -  Office  [phone]

## 2019-12-29 NOTE — Evaluation (Signed)
Occupational Therapy Evaluation Patient Details Name: Grant Mcdaniel MRN: [id number] DOB: [birth year] Today's Date: 12/29/2019    History of Present Illness Patient is a 79 year old male who was admitted to [facility] for a R hip hemiarthroplasty secondary to a right femoral neck fracture. Pt. PMHx includes: CAD, CKD, DM with CKD stage III, Hypercholesterolemia, HTN, hypothyroid, PVD, and falls.   Clinical Impression   Pt. presents with weakness,  5/10 right hip pain, posterior hip precautions,limited activity tolerance, and limited functional mobility which limits his ability to complete basic ADL and IADL functioning. Pt. resides at home alone. Pt. was independent with ADLs, and IADL functioning: including meal preparation, and medication management. Pt. was able to drive , and perform home management tasks. Pt. Reports that he mostly baths at the sinkside. Pt. education was provided about A/E use for LE ADLs, posterior hip precautions, and was assisted in problem solving through daily routines, and anticipated IADL needs at home including managing groceries. Pt. could benefit from OT services for ADL training, A/E training, and pt. education about home modification, work simplification strategies, posterior hip precautions, and DME. Pt. would benefit from SNF level of care upon discharge with follow-up OT services at discharge to maximize independence and facilitate return to his PLOF.    Follow Up Recommendations  SNF    Equipment Recommendations  3 in 1 bedside commode    Recommendations for Other Services       Precautions / Restrictions Precautions Precautions: Posterior Hip Precaution Booklet Issued: Yes (comment) Precaution Comments: WBAT Restrictions Weight Bearing Restrictions: Yes RLE Weight Bearing: Weight bearing as tolerated      Mobility Bed Mobility  Transfers Overall transfer level: Needs assistance Equipment used: Rolling walker (2 wheeled) Transfers: Sit to/from  Stand Sit to Stand: Max assist;From elevated surface         General transfer comment: Mobility per PT report    Balance Overall balance assessment: Needs assistance Sitting-balance support: Bilateral upper extremity supported;Feet supported Sitting balance-Leahy Scale: Poor Sitting balance - Comments: excessive left lean, limited ability to find COM Postural control: Left lateral lean Standing balance support: Bilateral upper extremity supported Standing balance-Leahy Scale: Poor Standing balance comment: requires Max A                           ADL either performed or assessed with clinical judgement   ADL Overall ADL's : Needs assistance/impaired Eating/Feeding: Set up;Sitting;Independent;Bed level   Grooming: Bed level;Independent   Upper Body Bathing: Set up;Minimal assistance   Lower Body Bathing: Set up;Maximal assistance   Upper Body Dressing : Set up;Minimal assistance   Lower Body Dressing: Set up;Maximal assistance                 General ADL Comments: Pt. education was provided about A/E use for LE ADLs.     Vision         Perception     Praxis      Pertinent Vitals/Pain Pain Assessment: 0-10 Pain Score: 5  Pain Location: Right hip     Hand Dominance Right   Extremity/Trunk Assessment Upper Extremity Assessment Upper Extremity Assessment: Generalized weakness   Lower Extremity Assessment Lower Extremity Assessment: RLE deficits/detail;LLE deficits/detail RLE Deficits / Details: unable to fully assess due to post surgical: RLE Sensation: decreased light touch(patient challenged with task orientation) RLE Coordination: decreased gross motor LLE Deficits / Details: limited ability to follow commands, able to touch PT's hands but  does not resist muscle testing LLE Sensation: decreased proprioception LLE Coordination: decreased gross motor       Communication Communication Communication: No difficulties   Cognition  Arousal/Alertness: Awake/alert Behavior During Therapy: WFL for tasks assessed/performed Overall Cognitive Status: Difficult to assess                                 General Comments: Patient is able to follow simple commands but require re-direct and task orientation with multistep.   General Comments  bruising on face from previous fall.    Exercises   Shoulder Instructions      Home Living Family/patient expects to be discharged to:: Private residence Living Arrangements: Alone Available Help at Discharge: Family Type of Home: Mobile home Home Access: Stairs to enter Entrance Stairs-Number of Steps: 2 Entrance Stairs-Rails: Right;Left;Can reach both Home Layout: One level     Bathroom Shower/Tub: Tub/shower unit         Home Equipment: Cane - single point;Walker - 2 wheels   Additional Comments: Patient reports he uses two canes out in the community and a walker at home. Home is not set up with adaptive equipment.      Prior Functioning/Environment Level of Independence: Independent with assistive device(s)        Comments: Pt. was independent with ADLS, and IADLs. pt. took sinkside baths, meal preparation, medication management, and was able to drive.        OT Problem List: Decreased strength;Pain;Decreased knowledge of use of DME or AE;Decreased activity tolerance;Decreased range of motion;Decreased knowledge of precautions      OT Treatment/Interventions: Self-care/ADL training;Therapeutic exercise;Patient/family education;Therapeutic activities    OT Goals(Current goals can be found in the care plan section) Acute Rehab OT Goals Patient Stated Goal: to walk better and not fall again  OT Frequency: Min 2X/week   Barriers to D/C:            Co-evaluation              AM-PAC OT "6 Clicks" Daily Activity     Outcome Measure Help from another person eating meals?: None Help from another person taking care of personal grooming?: A  Little Help from another person toileting, which includes using toliet, bedpan, or urinal?: A Lot Help from another person bathing (including washing, rinsing, drying)?: A Lot Help from another person to put on and taking off regular upper body clothing?: A Little Help from another person to put on and taking off regular lower body clothing?: A Lot 6 Click Score: 16   End of Session    Activity Tolerance: Patient tolerated treatment well Patient left: in bed;with call bell/phone within reach;with bed alarm set  OT Visit Diagnosis: Unsteadiness on feet (R26.81)                Time: 1015-1040 OT Time Calculation (min): 25 min Charges:  OT Evaluation $OT Eval Moderate Complexity: 1 Mod  Harrel Carina, MS, OTR/L  Harrel Carina 12/29/2019, 12:53 PM

## 2019-12-29 NOTE — Progress Notes (Signed)
Subjective:  POD #1 s/p right hip hemiarthroplasty.   Patient reports right hip pain as mild.  Patient states he was able to get out of bed with physical therapy this morning.  Objective:   VITALS:   Vitals:   12/28/19 2116 12/29/19 0040 12/29/19 0442 12/29/19 0741  BP: (!) 136/54 (!) 136/56 (!) 142/59 (!) 135/56  Pulse: 70 74 75 72  Resp: 18 18 19 19   Temp: 98 F (36.7 C) 98.1 F (36.7 C) 98.4 F (36.9 C) 98.3 F (36.8 C)  TempSrc: Oral Oral Oral Oral  SpO2: 95% 90% 90% 91%  Weight:      Height:        PHYSICAL EXAM: Right lower extremity Neurovascular intact Sensation intact distally Intact pulses distally Dorsiflexion/Plantar flexion intact Incision: dressing C/D/I No cellulitis present Compartment soft  LABS  Results for orders placed or performed during the hospital encounter of 12/27/19 (from the past 24 hour(s))  Glucose, capillary     Status: Abnormal   Collection Time: 12/28/19 12:18 PM  Result Value Ref Range   Glucose-Capillary 178 (H) 70 - 99 mg/dL  Glucose, capillary     Status: Abnormal   Collection Time: 12/28/19  5:08 PM  Result Value Ref Range   Glucose-Capillary 196 (H) 70 - 99 mg/dL  Glucose, capillary     Status: Abnormal   Collection Time: 12/28/19  5:15 PM  Result Value Ref Range   Glucose-Capillary 202 (H) 70 - 99 mg/dL  Glucose, capillary     Status: Abnormal   Collection Time: 12/28/19  9:12 PM  Result Value Ref Range   Glucose-Capillary 148 (H) 70 - 99 mg/dL  CBC     Status: Abnormal   Collection Time: 12/29/19  5:13 AM  Result Value Ref Range   WBC 10.2 4.0 - 10.5 K/uL   RBC 2.65 (L) 4.22 - 5.81 MIL/uL   Hemoglobin 7.7 (L) 13.0 - 17.0 g/dL   HCT 24.0 (L) 39.0 - 52.0 %   MCV 90.6 80.0 - 100.0 fL   MCH 29.1 26.0 - 34.0 pg   MCHC 32.1 30.0 - 36.0 g/dL   RDW 14.5 11.5 - 15.5 %   Platelets 128 (L) 150 - 400 K/uL   nRBC 0.0 0.0 - 0.2 %  Basic metabolic panel     Status: Abnormal   Collection Time: 12/29/19  5:13 AM  Result  Value Ref Range   Sodium 138 135 - 145 mmol/L   Potassium 4.4 3.5 - 5.1 mmol/L   Chloride 103 98 - 111 mmol/L   CO2 26 22 - 32 mmol/L   Glucose, Bld 150 (H) 70 - 99 mg/dL   BUN 29 (H) 8 - 23 mg/dL   Creatinine, Ser 1.44 (H) 0.61 - 1.24 mg/dL   Calcium 8.9 8.9 - 10.3 mg/dL   GFR calc non Af Amer 47 (L) >60 mL/min   GFR calc Af Amer 54 (L) >60 mL/min   Anion gap 9 5 - 15  Glucose, capillary     Status: Abnormal   Collection Time: 12/29/19  7:39 AM  Result Value Ref Range   Glucose-Capillary 135 (H) 70 - 99 mg/dL   Comment 1 Notify RN     DG Elbow Complete Right  Result Date: 12/27/2019 CLINICAL DATA:  Recent fall with right elbow pain, initial encounter EXAM: RIGHT ELBOW - COMPLETE 3+ VIEW COMPARISON:  None. FINDINGS: Considerable degenerative changes of the elbow joint are noted particularly in the articulation of the humerus and  ulna. Soft tissue swelling is noted posteriorly consistent with the recent injury. No joint effusion is seen. Large olecranon spurs are seen as well as some findings suggestive of olecranon bursitis. IMPRESSION: No acute fracture is noted. Soft tissue changes are seen consistent with the given clinical history. Electronically Signed   By: Inez Catalina M.D.   On: 12/27/2019 16:16   CT ABDOMEN PELVIS W CONTRAST  Result Date: 12/28/2019 CLINICAL DATA:  Abdominal trauma with gross hematuria. EXAM: CT ABDOMEN AND PELVIS WITH CONTRAST TECHNIQUE: Multidetector CT imaging of the abdomen and pelvis was performed using the standard protocol following bolus administration of intravenous contrast. CONTRAST:  5mL OMNIPAQUE IOHEXOL 300 MG/ML  SOLN COMPARISON:  None. FINDINGS: LOWER CHEST: No basilar pleural or apical pericardial effusion. HEPATOBILIARY: Normal hepatic contours. No intra- or extrahepatic biliary dilatation. Status post cholecystectomy. PANCREAS: Normal pancreas. No ductal dilatation or peripancreatic fluid collection. SPLEEN: Normal. ADRENALS/URINARY TRACT: The  adrenal glands are normal. There is right-sided retroperitoneal stranding tracking from the right flank along the course of the right ureter, most evident at the level of the right common iliac bifurcation. The left ureter is normal. There is calcification and scarring of the left kidney upper pole. The urinary bladder is normal for degree of distention. There is a Foley catheter with the balloon inflated within the proximal penile urethra. STOMACH/BOWEL: There is no hiatal hernia. Normal duodenal course and caliber. No small bowel dilatation or inflammation. No focal colonic abnormality. Surgically absent. VASCULAR/LYMPHATIC: There is calcific atherosclerosis of the abdominal aorta. No abdominal or pelvic lymphadenopathy. REPRODUCTIVE: Markedly enlarged prostate measures 6.9 x 5.9 x 6.0 cm. MUSCULOSKELETAL. There is a comminuted fracture of the right femoral neck. There are bridging osteophytes of both sacroiliac joints. There are anterior lumbar vertebral enthesophytes and lower thoracic spinous process enthesophytes. There is moderate-to-severe foraminal stenosis bilaterally at L4-5 and L5-S1. OTHER: None. IMPRESSION: 1. Right-sided retroperitoneal stranding tracking from the right flank along the course of the right ureter, most evident at the level of the right common iliac bifurcation. Given the recent fall, this might be a sequela of trauma. Ascending urinary tract infection might also cause this appearance. 2. Foley catheter balloon is inflated within the proximal penile urethra. Removal is recommended. 3. Comminuted fracture of the right femoral neck. 4. Markedly enlarged prostate. 5. Aortic Atherosclerosis (ICD10-I70.0). Electronically Signed   By: Ulyses Jarred M.D.   On: 12/28/2019 03:42   DG Chest Portable 1 View  Result Date: 12/27/2019 CLINICAL DATA:  Right hip pain, hypertension EXAM: PORTABLE CHEST 1 VIEW COMPARISON:  None. FINDINGS: Heart size is mildly enlarged. Aortic atherosclerosis. Both  lungs are clear. The visualized skeletal structures are unremarkable. IMPRESSION: 1. No active cardiopulmonary disease. 2. Mild cardiomegaly. 3. Aortic atherosclerosis. Electronically Signed   By: Davina Poke D.O.   On: 12/27/2019 17:44   DG Knee Left Port  Result Date: 12/28/2019 CLINICAL DATA:  Left knee pain. EXAM: PORTABLE LEFT KNEE - 1-2 VIEW COMPARISON:  None. FINDINGS: No evidence of fracture, dislocation, or joint effusion. Advanced degenerative joint disease identified with marked medial compartment narrowing, subchondral sclerosis and marginal spur formation. No fractures or dislocations. IMPRESSION: 1. Advanced degenerative joint disease. 2. No acute findings. Electronically Signed   By: Kerby Moors M.D.   On: 12/28/2019 11:28   DG Knee Right Port  Result Date: 12/28/2019 CLINICAL DATA:  Right knee pain. EXAM: PORTABLE RIGHT KNEE - 1-2 VIEW COMPARISON:  None. FINDINGS: No acute fracture is identified on this single AP  radiograph. There is severe medial compartment joint space narrowing with mild genu varus deformity. Moderate medial and lateral compartment marginal osteophytosis is noted. There is mild diffuse soft tissue swelling. IMPRESSION: 1. Severe medial compartment osteoarthrosis. 2. No acute osseous abnormality identified. Electronically Signed   By: Logan Bores M.D.   On: 12/28/2019 11:42   DG Hip Port Unilat With Pelvis 1V Right  Result Date: 12/28/2019 CLINICAL DATA:  79 year old male with a history of total hip replacement EXAM: DG HIP (WITH OR WITHOUT PELVIS) 1V PORT RIGHT COMPARISON:  12/27/2019 FINDINGS: Interval surgical changes of right hip arthroplasty. Gas and swelling at the surgical bed. Surgical staples project within the soft tissues. Urinary catheter in position. Left hip with degenerative changes.  Bony pelvic ring intact. IMPRESSION: Early surgical changes of right hip arthroplasty without complicating features. Urinary catheter. Electronically Signed   By:  Corrie Mckusick D.O.   On: 12/28/2019 11:30   DG Hip Unilat W or Wo Pelvis 2-3 Views Right  Result Date: 12/27/2019 CLINICAL DATA:  Fall. EXAM: DG HIP (WITH OR WITHOUT PELVIS) 2-3V RIGHT COMPARISON:  No recent. FINDINGS: Diffuse severe osteopenia and degenerative change. Angulated fracture of the right femoral neck is noted. No evidence of dislocation. IMPRESSION: 1.  Angulated right femoral neck fracture. 2.  Diffuse severe osteopenia degenerative change. Electronically Signed   By: Marcello Moores  Register   On: 12/27/2019 16:16    Assessment/Plan: 1 Day Post-Op   Active Problems:   Closed hip fracture requiring operative repair, right, sequela   Complication of Foley catheter ([place])   Leukocytosis  Patient has a hemoglobin of 7.7 today.  Recheck labs tomorrow morning.  Patient's blood glucose slightly elevated at 150.  Patient should continue his Foley catheter per urology.  Continue physical and occupational therapy.  Patient is weightbearing as tolerated on the right lower extremity with posterior hip precautions.  He is taking Plavix and aspirin for DVT prophylaxis.    Thornton Park , MD 12/29/2019, 10:59 AM

## 2019-12-29 NOTE — Evaluation (Signed)
Physical Therapy Evaluation Patient Details Name: Grant Mcdaniel MRN: [id number] DOB: 1941-06-22 Today's Date: 12/29/2019   History of Present Illness  Patient is a pleasant 79 year old male s/p R hip hemiarthroplasty 12/28/19. PMH of CAD, CKD, DM with CKD stage III, Hypercholesterolemia, HTN, hypothyroid, PVD, and falls.  Patient fell when he tried to go over a curb, has additional history of falling 2 weeks prior.  Clinical Impression  Patient is a pleasant 79 year old male who presents with limited mobility s/p R hip hemiarthroplasty on 12/28/19. Patient in bed eating breakfast upon PT entering room. Patient agreeable to PT session. Is slightly confused with ability to follow simple commands however requires cueing for task orientation and safety awareness. Patient reports no pain in post surgical limb and is min A to EOB. Upon reaching EOB patient is not able to obtain midline and has excessive external rotation of LLE (nonsurgical limb). Patient unable to place LLE into weightbearing neutral position in seated without assistance from PT and has excessive left leaning weight shift onto bed with limited ability to find COM of trunk without PT assistance via verbal tactile and visual cueing. Patient is able to move nonsurgical LLE to PT's hands but not able to follow commands of resistance, demonstrates limited coordination and spatial awareness/proprioception of nonsurgical limb. Surgical limb (RLE) able to perform movements within precautions with no pain reported by patient. Upon standing with RW patient requires blocking of L knee and foot with max A to obtain a partial stand. Patient attempted putting >90% of his weight onto surgical RLE with limited to no assistance from nonsurgical limb. Patient reports his LLE has been "wonky" for the past day or two. Patient returned to bed and performed supine interventions with 1L 02 via nasal cannula due to Sp02 <85% with activity on room air. Nursing notified of  LLE and oxygen, physician notified and informed as well by PT and nursing, will order MRI. Patient will benefit from skilled PT to improve mobility, stability, and strength for reduced fall risk. Patient will benefit from placement in SNF due to above mentioned impairments and limited safety with mobility.     Follow Up Recommendations SNF    Equipment Recommendations  None recommended by PT(if going to SNF)    Recommendations for Other Services       Precautions / Restrictions Precautions Precautions: (R hemiarthroplasty) Precaution Booklet Issued: Yes (comment) Precaution Comments: WBAT Restrictions Weight Bearing Restrictions: Yes RLE Weight Bearing: Weight bearing as tolerated      Mobility  Bed Mobility Overal bed mobility: Needs Assistance Bed Mobility: Supine to Sit;Sit to Supine     Supine to sit: Min assist Sit to supine: Mod assist   General bed mobility comments: assistance for RLE, max cueing for sequencing. Heavy lean to left with transition to EOB with limited ability to obtain midline  Transfers Overall transfer level: Needs assistance Equipment used: Rolling walker (2 wheeled) Transfers: Sit to/from Stand Sit to Stand: Max assist;From elevated surface         General transfer comment: Unable to fully stand with Max A from elevated bed, limited ability to place LLE and/or weightbear on nonsurgical LLE. Requires blocking of L knee and foot for stabilization, heavy weight shift onto post surgical limb and unsafe body mechanics  Ambulation/Gait             General Gait Details: not safe at this time.  Stairs  Wheelchair Mobility    Modified Rankin (Stroke Patients Only)       Balance Overall balance assessment: Needs assistance Sitting-balance support: Bilateral upper extremity supported;Feet supported Sitting balance-Leahy Scale: Poor Sitting balance - Comments: excessive left lean, limited ability to find COM Postural  control: Left lateral lean Standing balance support: Bilateral upper extremity supported Standing balance-Leahy Scale: Poor Standing balance comment: requires Max A                             Pertinent Vitals/Pain Pain Assessment: No/denies pain(patient denis pain in post surgical limb)    Home Living Family/patient expects to be discharged to:: Private residence Living Arrangements: Alone Available Help at Discharge: Family Type of Home: Mobile home Home Access: Stairs to enter Entrance Stairs-Rails: Right;Left;Can reach both Entrance Stairs-Number of Steps: 2 Home Layout: One level Home Equipment: Cane - single point;Walker - 2 wheels Additional Comments: Patient reports he uses two canes out in the community and a walker at home. Home is not set up with adaptive equipment.    Prior Function Level of Independence: Independent with assistive device(s)         Comments: Patient ambulates with two SPCs in the community and walker at home. Reports his brother helps him with shopping. Has not taken a bath/shower in 2-3 years: sponge baths instead     Hand Dominance   Dominant Hand: Right    Extremity/Trunk Assessment   Upper Extremity Assessment Upper Extremity Assessment: Defer to OT evaluation    Lower Extremity Assessment Lower Extremity Assessment: RLE deficits/detail;LLE deficits/detail RLE Deficits / Details: unable to fully assess due to post surgical: RLE Sensation: decreased light touch(patient challenged with task orientation) RLE Coordination: decreased gross motor LLE Deficits / Details: limited ability to follow commands, able to touch PT's hands but does not resist muscle testing LLE Sensation: decreased proprioception LLE Coordination: decreased gross motor       Communication   Communication: No difficulties  Cognition Arousal/Alertness: Awake/alert Behavior During Therapy: Impulsive;WFL for tasks assessed/performed Overall Cognitive  Status: Difficult to assess                                 General Comments: Patient is able to follow simple commands but require re-direct and task orientation with multistep.      General Comments General comments (skin integrity, edema, etc.): bruising on face from previous fall.    Exercises General Exercises - Lower Extremity Ankle Circles/Pumps: Strengthening;AROM;Both;10 reps;Supine Quad Sets: Strengthening;Right;10 reps;Supine Gluteal Sets: Strengthening;Both;10 reps;Supine Other Exercises Other Exercises: Patient educated on safe bed mobility and initiation of transfer with finding COM, patient not able to find COM without assistance, limited stabilization from nonsurgical limb. Other Exercises: supine HEP provided and performed with max cueing   Assessment/Plan    PT Assessment Patient needs continued PT services  PT Problem List Decreased strength;Decreased range of motion;Decreased activity tolerance;Decreased balance;Decreased coordination;Decreased mobility;Decreased safety awareness;Decreased knowledge of precautions       PT Treatment Interventions DME instruction;Gait training;Stair training;Functional mobility training;Neuromuscular re-education;Balance training;Therapeutic exercise;Therapeutic activities;Patient/family education;Manual techniques    PT Goals (Current goals can be found in the Care Plan section)  Acute Rehab PT Goals Patient Stated Goal: to walk better and not fall again PT Goal Formulation: With patient Time For Goal Achievement: 01/12/20 Potential to Achieve Goals: Fair    Frequency BID   Barriers to discharge  Inaccessible home environment;Decreased caregiver support will benefit from SNF    Co-evaluation               AM-PAC PT "6 Clicks" Mobility  Outcome Measure Help needed turning from your back to your side while in a flat bed without using bedrails?: A Little Help needed moving from lying on your back to  sitting on the side of a flat bed without using bedrails?: A Lot Help needed moving to and from a bed to a chair (including a wheelchair)?: Total Help needed standing up from a chair using your arms (e.g., wheelchair or bedside chair)?: A Lot Help needed to walk in hospital room?: Total Help needed climbing 3-5 steps with a railing? : Total 6 Click Score: 10    End of Session Equipment Utilized During Treatment: Gait belt;Oxygen(1 L 02; desat to 85% with exercise without 1L 02 via nasal cannula) Activity Tolerance: Other (comment)(limited ability to utilize/weightbear onto nonsurgical limb) Patient left: in bed;with call bell/phone within reach;with bed alarm set;with SCD's reapplied Nurse Communication: Mobility status;Weight bearing status;Other (comment)(left limb limited coordination/spatial awareness/weightbearing) PT Visit Diagnosis: Unsteadiness on feet (R26.81);Other abnormalities of gait and mobility (R26.89);Muscle weakness (generalized) (M62.81);History of falling (Z91.81);Difficulty in walking, not elsewhere classified (R26.2)    Time: [id number] PT Time Calculation (min) (ACUTE ONLY): 32 min   Charges:   PT Evaluation $PT Eval Moderate Complexity: 1 Mod PT Treatments $Therapeutic Exercise: 8-22 mins        Janna Arch, PT, DPT    Janna Arch 12/29/2019, 10:19 AM

## 2019-12-29 NOTE — Plan of Care (Signed)
  Problem: Education: Goal: Verbalization of understanding the information provided (i.e., activity precautions, restrictions, etc) will improve Outcome: Progressing Goal: Individualized Educational Video(s) Outcome: Progressing   Problem: Clinical Measurements: Goal: Postoperative complications will be avoided or minimized Outcome: Progressing   Problem: Pain Management: Goal: Pain level will decrease Outcome: Completed/Met  Pending PT/OT Deidre Ala, RN 12/29/2019 [phone]

## 2019-12-29 NOTE — Progress Notes (Signed)
PT concerned about patient not having much control or awareness of left leg. Dr Clementeen Graham notified and MRI brain ordered. MD assessed patient.

## 2019-12-30 ENCOUNTER — Inpatient Hospital Stay: Payer: Medicare Other

## 2019-12-30 LAB — GLUCOSE, CAPILLARY
Glucose-Capillary: 145 mg/dL — ABNORMAL HIGH (ref 70–99)
Glucose-Capillary: 183 mg/dL — ABNORMAL HIGH (ref 70–99)
Glucose-Capillary: 146 mg/dL — ABNORMAL HIGH (ref 70–99)
Glucose-Capillary: 161 mg/dL — ABNORMAL HIGH (ref 70–99)

## 2019-12-30 LAB — BASIC METABOLIC PANEL
Anion gap: 11 (ref 5–15)
BUN: 30 mg/dL — ABNORMAL HIGH (ref 8–23)
CO2: 24 mmol/L (ref 22–32)
Calcium: 9.5 mg/dL (ref 8.9–10.3)
Chloride: 100 mmol/L (ref 98–111)
Creatinine, Ser: 1.37 mg/dL — ABNORMAL HIGH (ref 0.61–1.24)
GFR calc Af Amer: 57 mL/min — ABNORMAL LOW (ref >60)
GFR calc non Af Amer: 49 mL/min — ABNORMAL LOW (ref >60)
Glucose, Bld: 148 mg/dL — ABNORMAL HIGH (ref 70–99)
Potassium: 4.8 mmol/L (ref 3.5–5.1)
Sodium: 135 mmol/L (ref 135–145)

## 2019-12-30 LAB — LIPID PANEL
Cholesterol: 113 mg/dL (ref 0–200)
HDL: 30 mg/dL — ABNORMAL LOW (ref >40)
LDL Cholesterol: 57 mg/dL (ref 0–99)
Total CHOL/HDL Ratio: 3.8 RATIO
Triglycerides: 129 mg/dL (ref <150)
VLDL: 26 mg/dL (ref 0–40)

## 2019-12-30 LAB — CBC
HCT: 23.9 % — ABNORMAL LOW (ref 39.0–52.0)
Hemoglobin: 7.7 g/dL — ABNORMAL LOW (ref 13.0–17.0)
MCH: 28.8 pg (ref 26.0–34.0)
MCHC: 32.2 g/dL (ref 30.0–36.0)
MCV: 89.5 fL (ref 80.0–100.0)
Platelets: 132 10*3/uL — ABNORMAL LOW (ref 150–400)
RBC: 2.67 MIL/uL — ABNORMAL LOW (ref 4.22–5.81)
RDW: 14.4 % (ref 11.5–15.5)
WBC: 10.9 10*3/uL — ABNORMAL HIGH (ref 4.0–10.5)
nRBC: 0 % (ref 0.0–0.2)

## 2019-12-30 LAB — ECHOCARDIOGRAM COMPLETE
Height: 74 in
Weight: 3744 oz

## 2019-12-30 LAB — PREPARE RBC (CROSSMATCH)

## 2019-12-30 MED ORDER — SODIUM CHLORIDE 0.9% IV SOLUTION: Freq: Once | INTRAVENOUS | Status: AC

## 2019-12-30 NOTE — Progress Notes (Signed)
Bladder scanned patient, 300cc's in bladder, bright red blood in urine. Patient disconnected clear tubing from yellow tubing in catheter and removed IV. Notified Dr. Bernardo Heater on call for urology, received verbal order to deflate balloon and advance catheter. Patient tolerated well, 300cc output. Dr. Bernardo Heater came to see the patient at bedside, discussed blood, no new orders received. Mittens applied to patient. Reinserted IV.

## 2019-12-30 NOTE — Progress Notes (Signed)
PROGRESS NOTE                                                                                                                                                                                                             Patient Demographics:    Grant Mcdaniel, is a 79 y.o. male, DOB - 1941/09/12, [id number]:[id number]  Admit date - 12/27/2019   Admitting Physician Loletha Grayer, MD  Outpatient Primary MD for the patient is Center, [facility]  LOS - 3  Outpatient Specialists: None  Chief Complaint  Patient presents with  . Fall       Brief Narrative 79 year old male with history of coronary artery disease, diabetes mellitus, hypertension, hypothyroidism, PVD, chronic kidney disease stage IIIa presented to the hospital after tripping on his driveway and fell on his right side.  2 weeks back he also had a fall bruising his right eye and forehead.  At that time head CT done was negative for any bleeding or injury.  X-ray done in the ED showed right hip fracture.  Patient admitted to hospital service and underwent right hip hemiarthroplasty on 1/8. On 1/9, when physical therapist was evaluating the patient she noticed patient to have poor coordination in his left leg.  MRI brain was done showing acute small right cerebellar stroke.  Subjective:   Noted for bright red blood in urine.  Urology was consulted who recommended deflating balloon and advancing the catheter after which 300 cc urine was drained.  Patient reports right hip pain to be better.  No other overnight events.  Assessment  & Plan :   Principal problems:   Closed hip fracture requiring operative repair, right, sequela Pain better controlled.  Continue as needed Vicodin and Robaxin.  Continue bowel regimen.  Aspirin 325 mg twice daily for DVT prophylaxis.  Patient already on Plavix. PT recommends SNF.  Acute right cerebellar stroke ([place]) Noted for impaired  coordination on the left lower leg during physical therapy this morning.  Patient had a fall at home hitting his right side of the head 2 weeks back.  At that time head CT was negative.  MRI done this morning showing right cerebellar stroke.  Stroke work-up initiated.  Patient is out of therapeutic window for TPA as onset of stroke seems to be at least 72 hours. On twice daily full  dose aspirin, Plavix already.  LDL of 57.  On Lipitor already.  Follow 2D echo, carotid Doppler.  PT/OT and speech eval. neurology consult appreciated.  Active problems   Complication of Foley catheter [facility]) Patient has BPH on Flomax.  Patient had Foley placed in with balloon blown up into the prostate.  It was removed and urology placed a new catheter and recommends it to be in for 1 week.  Noted for hematuria past 24 hours.  Check renal ultrasound.  Acute postoperative blood loss anemia Hemoglobin still low at 7.7.  Noted for hematuria past 24 hours.  Ordered 1 unit PRBC.  Patient on twice daily full dose aspirin and Plavix.  Need to monitor closely for active bleeding.  Check stool for Hemoccult.  Hypothyroidism Continue Synthroid  History of CAD and peripheral vascular disease Continue home meds  Essential hypertension Stable.  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  Continue sliding scale coverage     Code Status : DNR  Family Communication  : None  Disposition Plan  : SNF possibly on 1/11, pending stroke work-up, neurology evaluation, further improvement in his postoperative course.  Barriers For Discharge : Active symptoms (postoperative, stroke)  Consults  : Orthopedic, neurology  Procedures  : Right hemiarthroplasty, MRI brain  DVT Prophylaxis  :  Lovenox -  Lab Results  Component Value Date   PLT 132 (L) 12/30/2019    Antibiotics    Anti-infectives (From admission, onward)   Start     Dose/Rate Route Frequency Ordered Stop    12/28/19 1400  ceFAZolin (ANCEF) IVPB 1 g/50 mL premix     1 g 100 mL/hr over 30 Minutes Intravenous Every 6 hours 12/28/19 1151 12/29/19 1900   12/28/19 0747  ceFAZolin (ANCEF) 2-4 GM/100ML-% IVPB    Note to Pharmacy: Norton Blizzard  : cabinet override      12/28/19 0747 12/28/19 0827   12/27/19 2019  ceFAZolin (ANCEF) IVPB 2g/100 mL premix     2 g 200 mL/hr over 30 Minutes Intravenous 30 min pre-op 12/27/19 2019 12/28/19 0835        Objective:   Vitals:   12/29/19 2337 12/30/19 0153 12/30/19 0443 12/30/19 0739  BP: (!) 153/67 (!) 159/71 (!) 169/63 136/60  Pulse: 78 81 82 85  Resp: 17 16 17 18   Temp: 99.1 F (37.3 C)  99.1 F (37.3 C) 98.8 F (37.1 C)  TempSrc: Oral   Oral  SpO2: 99% 96% 97% 93%  Weight:      Height:        Wt Readings from Last 3 Encounters:  12/27/19 106.1 kg  12/27/19 106.1 kg  12/17/19 104.3 kg     Intake/Output Summary (Last 24 hours) at 12/30/2019 1050 Last data filed at 12/30/2019 0500 Gross per 24 hour  Intake --  Output 2050 ml  Net -2050 ml    Physical exam Elderly male, not in distress HEENT: Moist mucosa, supple neck Chest: Clear CVs: Normal S1-S2 GI: Soft, nondistended, nontender, Foley with blood mixed urine Musculoskeletal: Clean dressing over right hip CNS: Alert and oriented, no focal weakness of lower extremity    Data Review:    CBC Recent Labs  Lab 12/27/19 1719 12/28/19 0248 12/29/19 0513 12/30/19 0341  WBC 16.2* 10.9* 10.2 10.9*  HGB 11.9* 11.0* 7.7* 7.7*  HCT 38.8* 33.5* 24.0* 23.9*  PLT 167 148* 128* 132*  MCV 93.5 88.9 90.6 89.5  MCH 28.7 29.2 29.1  28.8  MCHC 30.7 32.8 32.1 32.2  RDW 14.1 14.1 14.5 14.4  LYMPHSABS 1.4  --   --   --   MONOABS 0.8  --   --   --   EOSABS 0.0  --   --   --   BASOSABS 0.0  --   --   --     Chemistries  Recent Labs  Lab 12/27/19 1719 12/28/19 0521 12/29/19 0513 12/30/19 0341  NA 138 138 138 135  K 4.6 4.3 4.4 4.8  CL 101 102 103 100  CO2 24 25 26 24    GLUCOSE 177* 153* 150* 148*  BUN 26* 26* 29* 30*  CREATININE 1.44* 1.42* 1.44* 1.37*  CALCIUM 9.7 9.4 8.9 9.5  AST 37  --   --   --   ALT 16  --   --   --   ALKPHOS 101  --   --   --   BILITOT 1.8*  --   --   --    ------------------------------------------------------------------------------------------------------------------ Recent Labs    12/30/19 0341  CHOL 113  HDL 30*  LDLCALC 57  TRIG 129  CHOLHDL 3.8    Lab Results  Component Value Date   HGBA1C 7.3 (H) 12/27/2019   ------------------------------------------------------------------------------------------------------------------ No results for input(s): TSH, T4TOTAL, T3FREE, THYROIDAB in the last 72 hours.  Invalid input(s): FREET3 ------------------------------------------------------------------------------------------------------------------ No results for input(s): VITAMINB12, FOLATE, FERRITIN, TIBC, IRON, RETICCTPCT in the last 72 hours.  Coagulation profile No results for input(s): INR, PROTIME in the last 168 hours.  No results for input(s): DDIMER in the last 72 hours.  Cardiac Enzymes No results for input(s): CKMB, TROPONINI, MYOGLOBIN in the last 168 hours.  Invalid input(s): CK ------------------------------------------------------------------------------------------------------------------ No results found for: BNP  Inpatient Medications  Scheduled Meds: .  stroke: mapping our early stages of recovery book   Does not apply Once  . sodium chloride   Intravenous Once  . amLODipine  10 mg Oral Daily   And  . benazepril  40 mg Oral Daily  . aspirin EC  325 mg Oral BID  . atorvastatin  40 mg Oral q1800  . bisoprolol-hydrochlorothiazide  1 tablet Oral Daily  . Chlorhexidine Gluconate Cloth  6 each Topical Daily  . clopidogrel  75 mg Oral Daily  . docusate sodium  100 mg Oral BID  . fluticasone  1 spray Each Nare Daily  . insulin aspart  0-5 Units Subcutaneous QHS  . insulin aspart  0-9 Units  Subcutaneous TID WC  . levothyroxine  75 mcg Oral Q0600  . loratadine  10 mg Oral Daily  . senna  1 tablet Oral BID  . tamsulosin  0.4 mg Oral QPC breakfast   Continuous Infusions: . methocarbamol (ROBAXIN) IV     PRN Meds:.acetaminophen, alum & mag hydroxide-simeth, bisacodyl, HYDROcodone-acetaminophen, HYDROcodone-acetaminophen, magnesium citrate, methocarbamol **OR** methocarbamol (ROBAXIN) IV, morphine injection, ondansetron **OR** ondansetron (ZOFRAN) IV, polyethylene glycol  Micro Results Recent Results (from the past 240 hour(s))  SARS CORONAVIRUS 2 (TAT 6-24 HRS) Nasopharyngeal Nasopharyngeal Swab     Status: None   Collection Time: 12/27/19  5:19 PM   Specimen: Nasopharyngeal Swab  Result Value Ref Range Status   SARS Coronavirus 2 NEGATIVE NEGATIVE Final    Comment: (NOTE) SARS-CoV-2 target nucleic acids are NOT DETECTED. The SARS-CoV-2 RNA is generally detectable in upper and lower respiratory specimens during the acute phase of infection. Negative results do not preclude SARS-CoV-2 infection, do not rule out co-infections with other pathogens, and  should not be used as the sole basis for treatment or other patient management decisions. Negative results must be combined with clinical observations, patient history, and epidemiological information. The expected result is Negative. Fact Sheet for Patients: SugarRoll.be Fact Sheet for Healthcare Providers: https://www.woods-mathews.com/ This test is not yet approved or cleared by the [place] FDA and  has been authorized for detection and/or diagnosis of SARS-CoV-2 by FDA under an Emergency Use Authorization (EUA). This EUA will remain  in effect (meaning this test can be used) for the duration of the COVID-19 declaration under Section 56 4(b)(1) of the Act, 21 U.S.C. section 360bbb-3(b)(1), unless the authorization is terminated or revoked sooner. Performed at [facility], [place] [address] [place], [place] [address]   SARS Coronavirus 2 by RT PCR (hospital order, performed in [place] hospital lab)     Status: None   Collection Time: 12/27/19 10:17 PM  Result Value Ref Range Status   SARS Coronavirus 2 NEGATIVE NEGATIVE Final    Comment: (NOTE) SARS-CoV-2 target nucleic acids are NOT DETECTED. The SARS-CoV-2 RNA is generally detectable in upper and lower respiratory specimens during the acute phase of infection. The lowest concentration of SARS-CoV-2 viral copies this assay can detect is 250 copies / mL. A negative result does not preclude SARS-CoV-2 infection and should not be used as the sole basis for treatment or other patient management decisions.  A negative result may occur with improper specimen collection / handling, submission of specimen other than nasopharyngeal swab, presence of viral mutation(s) within the areas targeted by this assay, and inadequate number of viral copies (<250 copies / mL). A negative result must be combined with clinical observations, patient history, and epidemiological information. Fact Sheet for Patients:   StrictlyIdeas.no Fact Sheet for Healthcare Providers: BankingDealers.co.za This test is not yet approved or cleared  by the [place] FDA and has been authorized for detection and/or diagnosis of SARS-CoV-2 by FDA under an Emergency Use Authorization (EUA).  This EUA will remain in effect (meaning this test can be used) for the duration of the COVID-19 declaration under Section 564(b)(1) of the Act, 21 U.S.C. section 360bbb-3(b)(1), unless the authorization is terminated or revoked sooner. Performed at [facility], [address] [place], [place] [address]     Radiology Reports DG Elbow Complete Right  Result Date: 12/27/2019 CLINICAL DATA:  Recent fall with right elbow pain, initial encounter EXAM: RIGHT ELBOW - COMPLETE 3+ VIEW  COMPARISON:  None. FINDINGS: Considerable degenerative changes of the elbow joint are noted particularly in the articulation of the humerus and ulna. Soft tissue swelling is noted posteriorly consistent with the recent injury. No joint effusion is seen. Large olecranon spurs are seen as well as some findings suggestive of olecranon bursitis. IMPRESSION: No acute fracture is noted. Soft tissue changes are seen consistent with the given clinical history. Electronically Signed   By: Inez Catalina M.D.   On: 12/27/2019 16:16   CT Head Wo Contrast  Result Date: 12/17/2019 CLINICAL DATA:  Fall. EXAM: CT HEAD WITHOUT CONTRAST TECHNIQUE: Contiguous axial images were obtained from the base of the skull through the vertex without intravenous contrast. COMPARISON:  None. FINDINGS: Brain: No evidence of acute infarction, hemorrhage, hydrocephalus, extra-axial collection or mass lesion/mass effect. Small remote bilateral cerebellar infarcts. Mild brain atrophy. Vascular: Atherosclerotic calcification Skull: Right forehead laceration.  No calvarial fracture. Sinuses/Orbits: No evidence of injury IMPRESSION: 1. No evidence of intracranial injury. 2. Right forehead laceration without fracture. Electronically  Signed   By: Monte Fantasia M.D.   On: 12/17/2019 04:30   CT Cervical Spine Wo Contrast  Addendum Date: 12/17/2019   ADDENDUM REPORT: 12/17/2019 09:36 ADDENDUM: Correction to impression #2, the second sentence should read in the upper CERVICAL levels there is also posterior longitudinal ligament ossification. Electronically Signed   By: Monte Fantasia M.D.   On: 12/17/2019 09:36   Result Date: 12/17/2019 CLINICAL DATA:  Fall with laceration. EXAM: CT CERVICAL SPINE WITHOUT CONTRAST TECHNIQUE: Multidetector CT imaging of the cervical spine was performed without intravenous contrast. Multiplanar CT image reconstructions were also generated. COMPARISON:  None. FINDINGS: Alignment: Normal Skull base and vertebrae:  Negative for fracture. 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 There is ankylosis from the occiput to T2. 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 bone island in the C2 body. Soft tissues and spinal canal: No prevertebral fluid or swelling. No visible canal hematoma. Lipoma in the posterior left para median neck the even more simple internal architecture than adjacent fat, up to 3 cm on axial slices. Disc levels:  Spondylitic changes as noted above. Upper chest: Negative IMPRESSION: 1. No acute finding. 2. 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 In the upper thoracic levels there is also posterior longitudinal ligament ossification. Electronically Signed: By: Monte Fantasia M.D. On: 12/17/2019 09:04   MR BRAIN WO CONTRAST  Result Date: 12/29/2019 CLINICAL DATA:  Acute neuro deficit, stroke suspected. Left leg symptoms. Recent fall sustaining a right femoral neck fracture treated with arthroplasty. EXAM: MRI HEAD WITHOUT CONTRAST TECHNIQUE: Multiplanar, multiecho pulse sequences of the brain and surrounding structures were obtained without intravenous contrast. COMPARISON:  Head CT 12/17/2019 FINDINGS: Brain: There is a 6 mm acute right cerebellar infarct. Small chronic infarcts are present in the cerebellum bilaterally. No intracranial hemorrhage, mass, midline shift, or extra-axial fluid collection is identified. There is moderate cerebral atrophy. A few small foci of T2 hyperintensity in the cerebral white matter are nonspecific and not considered abnormal for age. Vascular: Major intracranial vascular flow voids are preserved. Skull and upper cervical spine: Unremarkable bone marrow signal. Prominent ligamentous thickening posterior to the dens without significant mass effect on the cervicomedullary junction. Sinuses/Orbits: Unremarkable orbits. Small right maxillary sinus mucous  retention cyst. Mild scattered mucosal thickening in the paranasal sinuses, greatest in the left sphenoid sinus. Clear mastoid air cells. Other: None. IMPRESSION: 1. Small acute right cerebellar infarct. 2. Chronic bilateral cerebellar infarcts. Electronically Signed   By: Logan Bores M.D.   On: 12/29/2019 11:48   CT ABDOMEN PELVIS W CONTRAST  Result Date: 12/28/2019 CLINICAL DATA:  Abdominal trauma with gross hematuria. EXAM: CT ABDOMEN AND PELVIS WITH CONTRAST TECHNIQUE: Multidetector CT imaging of the abdomen and pelvis was performed using the standard protocol following bolus administration of intravenous contrast. CONTRAST:  59mL OMNIPAQUE IOHEXOL 300 MG/ML  SOLN COMPARISON:  None. FINDINGS: LOWER CHEST: No basilar pleural or apical pericardial effusion. HEPATOBILIARY: Normal hepatic contours. No intra- or extrahepatic biliary dilatation. Status post cholecystectomy. PANCREAS: Normal pancreas. No ductal dilatation or peripancreatic fluid collection. SPLEEN: Normal. ADRENALS/URINARY TRACT: The adrenal glands are normal. There is right-sided retroperitoneal stranding tracking from the right flank along the course of the right ureter, most evident at the level of the right common iliac bifurcation. The left ureter is normal. There is calcification and scarring of the left kidney upper pole. The urinary bladder is normal for degree of distention.  There is a Foley catheter with the balloon inflated within the proximal penile urethra. STOMACH/BOWEL: There is no hiatal hernia. Normal duodenal course and caliber. No small bowel dilatation or inflammation. No focal colonic abnormality. Surgically absent. VASCULAR/LYMPHATIC: There is calcific atherosclerosis of the abdominal aorta. No abdominal or pelvic lymphadenopathy. REPRODUCTIVE: Markedly enlarged prostate measures 6.9 x 5.9 x 6.0 cm. MUSCULOSKELETAL. There is a comminuted fracture of the right femoral neck. There are bridging osteophytes of both sacroiliac  joints. There are anterior lumbar vertebral enthesophytes and lower thoracic spinous process enthesophytes. There is moderate-to-severe foraminal stenosis bilaterally at L4-5 and L5-S1. OTHER: None. IMPRESSION: 1. Right-sided retroperitoneal stranding tracking from the right flank along the course of the right ureter, most evident at the level of the right common iliac bifurcation. Given the recent fall, this might be a sequela of trauma. Ascending urinary tract infection might also cause this appearance. 2. Foley catheter balloon is inflated within the proximal penile urethra. Removal is recommended. 3. Comminuted fracture of the right femoral neck. 4. Markedly enlarged prostate. 5. Aortic Atherosclerosis (ICD10-I70.0). Electronically Signed   By: Ulyses Jarred M.D.   On: 12/28/2019 03:42   US Carotid Bilateral (at [facility] and [facility] only)  Result Date: 12/29/2019 CLINICAL DATA:  Acute cerebellar infarct EXAM: BILATERAL CAROTID DUPLEX ULTRASOUND TECHNIQUE: Pearline Cables scale imaging, color Doppler and duplex ultrasound were performed of bilateral carotid and vertebral arteries in the neck. COMPARISON:  12/29/2019 FINDINGS: Criteria: Quantification of carotid stenosis is based on velocity parameters that correlate the residual internal carotid diameter with NASCET-based stenosis levels, using the diameter of the distal internal carotid lumen as the denominator for stenosis measurement. The following velocity measurements were obtained: RIGHT ICA: 101/15 cm/sec CCA: 0000000 cm/sec SYSTOLIC ICA/CCA RATIO:  1.3 ECA: 133 cm/sec LEFT ICA: 98/17 cm/sec CCA: 123456 cm/sec SYSTOLIC ICA/CCA RATIO:  1.0 ECA: 127 cm/sec RIGHT CAROTID ARTERY: Minor echogenic shadowing plaque formation. No hemodynamically significant right ICA stenosis, velocity elevation, or turbulent flow. Degree of narrowing less than 50%. RIGHT VERTEBRAL ARTERY:  Antegrade LEFT CAROTID ARTERY: Similar scattered minor echogenic plaque formation. No hemodynamically  significant left ICA stenosis, velocity elevation, or turbulent flow. LEFT VERTEBRAL ARTERY:  Antegrade IMPRESSION: Minor carotid atherosclerosis. No hemodynamically significant ICA stenosis. Degree of narrowing less than 50% bilaterally by ultrasound criteria. Patent antegrade vertebral flow bilaterally Electronically Signed   By: Jerilynn Mages.  Shick M.D.   On: 12/29/2019 13:55   DG Chest Portable 1 View  Result Date: 12/27/2019 CLINICAL DATA:  Right hip pain, hypertension EXAM: PORTABLE CHEST 1 VIEW COMPARISON:  None. FINDINGS: Heart size is mildly enlarged. Aortic atherosclerosis. Both lungs are clear. The visualized skeletal structures are unremarkable. IMPRESSION: 1. No active cardiopulmonary disease. 2. Mild cardiomegaly. 3. Aortic atherosclerosis. Electronically Signed   By: Davina Poke D.O.   On: 12/27/2019 17:44   DG Knee Left Port  Result Date: 12/28/2019 CLINICAL DATA:  Left knee pain. EXAM: PORTABLE LEFT KNEE - 1-2 VIEW COMPARISON:  None. FINDINGS: No evidence of fracture, dislocation, or joint effusion. Advanced degenerative joint disease identified with marked medial compartment narrowing, subchondral sclerosis and marginal spur formation. No fractures or dislocations. IMPRESSION: 1. Advanced degenerative joint disease. 2. No acute findings. Electronically Signed   By: Kerby Moors M.D.   On: 12/28/2019 11:28   DG Knee Right Port  Result Date: 12/28/2019 CLINICAL DATA:  Right knee pain. EXAM: PORTABLE RIGHT KNEE - 1-2 VIEW COMPARISON:  None. FINDINGS: No acute fracture is identified on this single AP radiograph. There  is severe medial compartment joint space narrowing with mild genu varus deformity. Moderate medial and lateral compartment marginal osteophytosis is noted. There is mild diffuse soft tissue swelling. IMPRESSION: 1. Severe medial compartment osteoarthrosis. 2. No acute osseous abnormality identified. Electronically Signed   By: Logan Bores M.D.   On: 12/28/2019 11:42    ECHOCARDIOGRAM COMPLETE  Result Date: 12/30/2019   ECHOCARDIOGRAM REPORT   Patient Name:   RENALD CARNELL Date of Exam: 12/29/2019 Medical Rec #:  [id number]        Height:       74.0 in Accession #:    [id number]       Weight:       234.0 lb Date of Birth:  1941/11/13        BSA:          2.32 m Patient Age:    38 years         BP:           135/56 mmHg Patient Gender: M                HR:           76 bpm. Exam Location:  [facility] Procedure: 2D Echo Indications:     STROKE 434.91/ I163.9  History:         Patient has no prior history of Echocardiogram examinations.  Sonographer:     Arville Go RDCS Referring Phys:  [place] Diagnosing Phys: Bartholome Bill MD IMPRESSIONS  1. Left ventricular ejection fraction, by visual estimation, is 55 to 60%. The left ventricle has normal function. Left ventricular septal wall thickness was mildly increased. There is borderline left ventricular hypertrophy.  2. The left ventricle has no regional wall motion abnormalities.  3. Global right ventricle has normal systolic function.The right ventricular size is normal. No increase in right ventricular wall thickness.  4. Left atrial size was normal.  5. Right atrial size was normal.  6. The mitral valve is grossly normal. Trivial mitral valve regurgitation.  7. The tricuspid valve is grossly normal.  8. The aortic valve is tricuspid. Aortic valve regurgitation is not visualized.  9. The pulmonic valve was grossly normal. Pulmonic valve regurgitation is not visualized. 10. The atrial septum is grossly normal. FINDINGS  Left Ventricle: Left ventricular ejection fraction, by visual estimation, is 55 to 60%. The left ventricle has normal function. The left ventricle has no regional wall motion abnormalities. There is borderline left ventricular hypertrophy. Right Ventricle: The right ventricular size is normal. No increase in right ventricular wall thickness. Global RV systolic function is has normal systolic function.  Left Atrium: Left atrial size was normal in size. Right Atrium: Right atrial size was normal in size Pericardium: There is no evidence of pericardial effusion. Mitral Valve: The mitral valve is grossly normal. Trivial mitral valve regurgitation. Tricuspid Valve: The tricuspid valve is grossly normal. Tricuspid valve regurgitation is mild. Aortic Valve: The aortic valve is tricuspid. Aortic valve regurgitation is not visualized. Aortic valve peak gradient measures 8.6 mmHg. Pulmonic Valve: The pulmonic valve was grossly normal. Pulmonic valve regurgitation is not visualized. Pulmonic regurgitation is not visualized. Aorta: The aortic root is normal in size and structure. IAS/Shunts: The atrial septum is grossly normal.  LEFT VENTRICLE PLAX 2D LVIDd:         4.29 cm  Diastology LVIDs:         3.05 cm  LV e' lateral:   7.29 cm/s LV PW:  1.23 cm  LV E/e' lateral: 9.5 LV IVS:        1.21 cm  LV e' medial:    7.51 cm/s LVOT diam:     2.30 cm  LV E/e' medial:  9.2 LV SV:         46 ml LV SV Index:   19.43 LVOT Area:     4.15 cm  RIGHT VENTRICLE RV Basal diam:  3.09 cm RV S prime:     11.40 cm/s TAPSE (M-mode): 2.1 cm LEFT ATRIUM           Index      RIGHT ATRIUM           Index LA diam:      2.30 cm 0.99 cm/m RA Area:     15.70 cm LA Vol (A2C): 18.9 ml 8.13 ml/m RA Volume:   41.10 ml  17.69 ml/m LA Vol (A4C): 12.0 ml 5.16 ml/m  AORTIC VALVE                PULMONIC VALVE AV Area (Vmax): 2.46 cm    PV Vmax:       1.03 m/s AV Vmax:        147.00 cm/s PV Peak grad:  4.2 mmHg AV Peak Grad:   8.6 mmHg LVOT Vmax:      87.20 cm/s LVOT Vmean:     57.100 cm/s LVOT VTI:       0.166 m  AORTA Ao Root diam: 3.70 cm Ao Asc diam:  3.30 cm MV E velocity: 69.40 cm/s 103 cm/s  TRICUSPID VALVE MV A velocity: 70.30 cm/s 70.3 cm/s TV Peak grad:   36.2 mmHg MV E/A ratio:  0.99       1.5       TV Vmax:        3.01 m/s                                      SHUNTS                                     Systemic VTI:  0.17 m                                      Systemic Diam: 2.30 cm  Bartholome Bill MD Electronically signed by Bartholome Bill MD Signature Date/Time: 12/30/2019/8:26:01 AM    Final    DG Hip Port Unilat With Pelvis 1V Right  Result Date: 12/28/2019 CLINICAL DATA:  79 year old male with a history of total hip replacement EXAM: DG HIP (WITH OR WITHOUT PELVIS) 1V PORT RIGHT COMPARISON:  12/27/2019 FINDINGS: Interval surgical changes of right hip arthroplasty. Gas and swelling at the surgical bed. Surgical staples project within the soft tissues. Urinary catheter in position. Left hip with degenerative changes.  Bony pelvic ring intact. IMPRESSION: Early surgical changes of right hip arthroplasty without complicating features. Urinary catheter. Electronically Signed   By: Corrie Mckusick D.O.   On: 12/28/2019 11:30   DG Hip Unilat W or Wo Pelvis 2-3 Views Right  Result Date: 12/27/2019 CLINICAL DATA:  Fall. EXAM: DG HIP (WITH OR WITHOUT PELVIS) 2-3V RIGHT COMPARISON:  No recent. FINDINGS: Diffuse severe osteopenia and degenerative change. Angulated fracture  of the right femoral neck is noted. No evidence of dislocation. IMPRESSION: 1.  Angulated right femoral neck fracture. 2.  Diffuse severe osteopenia degenerative change. Electronically Signed   By: Marcello Moores  Register   On: 12/27/2019 16:16    Time Spent in minutes 35   Krzysztof Reichelt M.D on 12/30/2019 at 10:50 AM  Between 7am to 7pm - Pager - [phone]  After 7pm go to [email or web address] - password [facility]  [facility] -  Office  [phone]

## 2019-12-30 NOTE — Progress Notes (Signed)
Pt has urine with bright red blood with clots. Foley draining. Bladder scan showed 26ml. VSS.  Aspirin and plavix due. Notified Dr Clementeen Graham of the above.  Per MD to hold aspirin and plavix.

## 2019-12-30 NOTE — Progress Notes (Signed)
Subjective:  POD #2 s/p right hip hemiarthroplasty.   Patient reports right hip pain as mild.  Patient had an MRI of the brain which shows an acute 6 mm right cerebellar stroke.  Objective:   VITALS:   Vitals:   12/30/19 0739 12/30/19 1057 12/30/19 1343 12/30/19 1406  BP: 136/60 (!) 144/62 (!) 145/59 (!) 148/54  Pulse: 85 76 75 76  Resp: 18 18 18 18   Temp: 98.8 F (37.1 C) 98 F (36.7 C) 98.5 F (36.9 C) 98.4 F (36.9 C)  TempSrc: Oral Oral Oral Oral  SpO2: 93% 94% 93% 95%  Weight:      Height:        PHYSICAL EXAM: Right lower extremity Neurovascular intact Sensation intact distally Intact pulses distally Dorsiflexion/Plantar flexion intact Incision: scant drainage No cellulitis present Compartment soft  LABS  Results for orders placed or performed during the hospital encounter of 12/27/19 (from the past 24 hour(s))  Glucose, capillary     Status: Abnormal   Collection Time: 12/29/19  4:34 PM  Result Value Ref Range   Glucose-Capillary 150 (H) 70 - 99 mg/dL   Comment 1 Notify RN   Glucose, capillary     Status: Abnormal   Collection Time: 12/29/19  9:27 PM  Result Value Ref Range   Glucose-Capillary 146 (H) 70 - 99 mg/dL   Comment 1 Notify RN   Lipid panel     Status: Abnormal   Collection Time: 12/30/19  3:41 AM  Result Value Ref Range   Cholesterol 113 0 - 200 mg/dL   Triglycerides 129 <150 mg/dL   HDL 30 (L) >40 mg/dL   Total CHOL/HDL Ratio 3.8 RATIO   VLDL 26 0 - 40 mg/dL   LDL Cholesterol 57 0 - 99 mg/dL  Basic metabolic panel     Status: Abnormal   Collection Time: 12/30/19  3:41 AM  Result Value Ref Range   Sodium 135 135 - 145 mmol/L   Potassium 4.8 3.5 - 5.1 mmol/L   Chloride 100 98 - 111 mmol/L   CO2 24 22 - 32 mmol/L   Glucose, Bld 148 (H) 70 - 99 mg/dL   BUN 30 (H) 8 - 23 mg/dL   Creatinine, Ser 1.37 (H) 0.61 - 1.24 mg/dL   Calcium 9.5 8.9 - 10.3 mg/dL   GFR calc non Af Amer 49 (L) >60 mL/min   GFR calc Af Amer 57 (L) >60 mL/min    Anion gap 11 5 - 15  CBC     Status: Abnormal   Collection Time: 12/30/19  3:41 AM  Result Value Ref Range   WBC 10.9 (H) 4.0 - 10.5 K/uL   RBC 2.67 (L) 4.22 - 5.81 MIL/uL   Hemoglobin 7.7 (L) 13.0 - 17.0 g/dL   HCT 23.9 (L) 39.0 - 52.0 %   MCV 89.5 80.0 - 100.0 fL   MCH 28.8 26.0 - 34.0 pg   MCHC 32.2 30.0 - 36.0 g/dL   RDW 14.4 11.5 - 15.5 %   Platelets 132 (L) 150 - 400 K/uL   nRBC 0.0 0.0 - 0.2 %  Glucose, capillary     Status: Abnormal   Collection Time: 12/30/19  7:40 AM  Result Value Ref Range   Glucose-Capillary 145 (H) 70 - 99 mg/dL  Prepare RBC     Status: None   Collection Time: 12/30/19  8:00 AM  Result Value Ref Range   Order Confirmation      ORDER PROCESSED BY BLOOD BANK  Performed at [facility], [address] [place], [place] [address]   Glucose, capillary     Status: Abnormal   Collection Time: 12/30/19 11:54 AM  Result Value Ref Range   Glucose-Capillary 183 (H) 70 - 99 mg/dL    MR BRAIN WO CONTRAST  Result Date: 12/29/2019 CLINICAL DATA:  Acute neuro deficit, stroke suspected. Left leg symptoms. Recent fall sustaining a right femoral neck fracture treated with arthroplasty. EXAM: MRI HEAD WITHOUT CONTRAST TECHNIQUE: Multiplanar, multiecho pulse sequences of the brain and surrounding structures were obtained without intravenous contrast. COMPARISON:  Head CT 12/17/2019 FINDINGS: Brain: There is a 6 mm acute right cerebellar infarct. Small chronic infarcts are present in the cerebellum bilaterally. No intracranial hemorrhage, mass, midline shift, or extra-axial fluid collection is identified. There is moderate cerebral atrophy. A few small foci of T2 hyperintensity in the cerebral white matter are nonspecific and not considered abnormal for age. Vascular: Major intracranial vascular flow voids are preserved. Skull and upper cervical spine: Unremarkable bone marrow signal. Prominent ligamentous thickening posterior to the dens without significant mass  effect on the cervicomedullary junction. Sinuses/Orbits: Unremarkable orbits. Small right maxillary sinus mucous retention cyst. Mild scattered mucosal thickening in the paranasal sinuses, greatest in the left sphenoid sinus. Clear mastoid air cells. Other: None. IMPRESSION: 1. Small acute right cerebellar infarct. 2. Chronic bilateral cerebellar infarcts. Electronically Signed   By: Logan Bores M.D.   On: 12/29/2019 11:48   US RENAL  Result Date: 12/30/2019 CLINICAL DATA:  Hematuria EXAM: RENAL / URINARY TRACT ULTRASOUND COMPLETE COMPARISON:  None. FINDINGS: Right Kidney: Renal measurements: 12 x 4.2 x 5.0 cm = volume: 134 mL. Mildly echogenic cortex. Left Kidney: Renal measurements: 10.9 x 4.5 x 4.9 cm = volume: 128 mL. Limited evaluation. Increased cortical echogenicity in a mildly nodular contour. Bladder: Decompressed with a Foley catheter. Other: None. IMPRESSION: 1. Mildly echogenic cortex bilaterally suggesting the possibility medical renal disease. Evaluation of the left kidney is limited. 2. The bladder is decompressed with a Foley catheter. Electronically Signed   By: Dorise Bullion III M.D   On: 12/30/2019 12:19   US Carotid Bilateral (at [facility] and [facility] only)  Result Date: 12/29/2019 CLINICAL DATA:  Acute cerebellar infarct EXAM: BILATERAL CAROTID DUPLEX ULTRASOUND TECHNIQUE: Pearline Cables scale imaging, color Doppler and duplex ultrasound were performed of bilateral carotid and vertebral arteries in the neck. COMPARISON:  12/29/2019 FINDINGS: Criteria: Quantification of carotid stenosis is based on velocity parameters that correlate the residual internal carotid diameter with NASCET-based stenosis levels, using the diameter of the distal internal carotid lumen as the denominator for stenosis measurement. The following velocity measurements were obtained: RIGHT ICA: 101/15 cm/sec CCA: 0000000 cm/sec SYSTOLIC ICA/CCA RATIO:  1.3 ECA: 133 cm/sec LEFT ICA: 98/17 cm/sec CCA: 123456 cm/sec SYSTOLIC ICA/CCA RATIO:   1.0 ECA: 127 cm/sec RIGHT CAROTID ARTERY: Minor echogenic shadowing plaque formation. No hemodynamically significant right ICA stenosis, velocity elevation, or turbulent flow. Degree of narrowing less than 50%. RIGHT VERTEBRAL ARTERY:  Antegrade LEFT CAROTID ARTERY: Similar scattered minor echogenic plaque formation. No hemodynamically significant left ICA stenosis, velocity elevation, or turbulent flow. LEFT VERTEBRAL ARTERY:  Antegrade IMPRESSION: Minor carotid atherosclerosis. No hemodynamically significant ICA stenosis. Degree of narrowing less than 50% bilaterally by ultrasound criteria. Patent antegrade vertebral flow bilaterally Electronically Signed   By: Jerilynn Mages.  Shick M.D.   On: 12/29/2019 13:55   ECHOCARDIOGRAM COMPLETE  Result Date: 12/30/2019   ECHOCARDIOGRAM REPORT   Patient Name:   HENNING RUPARD Date  of Exam: 12/29/2019 Medical Rec #:  [id number]        Height:       74.0 in Accession #:    [id number]       Weight:       234.0 lb Date of Birth:  1941/10/07        BSA:          2.32 m Patient Age:    62 years         BP:           135/56 mmHg Patient Gender: M                HR:           76 bpm. Exam Location:  [facility] Procedure: 2D Echo Indications:     STROKE 434.91/ I163.9  History:         Patient has no prior history of Echocardiogram examinations.  Sonographer:     Arville Go RDCS Referring Phys:  [place] Diagnosing Phys: Bartholome Bill MD IMPRESSIONS  1. Left ventricular ejection fraction, by visual estimation, is 55 to 60%. The left ventricle has normal function. Left ventricular septal wall thickness was mildly increased. There is borderline left ventricular hypertrophy.  2. The left ventricle has no regional wall motion abnormalities.  3. Global right ventricle has normal systolic function.The right ventricular size is normal. No increase in right ventricular wall thickness.  4. Left atrial size was normal.  5. Right atrial size was normal.  6. The mitral valve is grossly normal.  Trivial mitral valve regurgitation.  7. The tricuspid valve is grossly normal.  8. The aortic valve is tricuspid. Aortic valve regurgitation is not visualized.  9. The pulmonic valve was grossly normal. Pulmonic valve regurgitation is not visualized. 10. The atrial septum is grossly normal. FINDINGS  Left Ventricle: Left ventricular ejection fraction, by visual estimation, is 55 to 60%. The left ventricle has normal function. The left ventricle has no regional wall motion abnormalities. There is borderline left ventricular hypertrophy. Right Ventricle: The right ventricular size is normal. No increase in right ventricular wall thickness. Global RV systolic function is has normal systolic function. Left Atrium: Left atrial size was normal in size. Right Atrium: Right atrial size was normal in size Pericardium: There is no evidence of pericardial effusion. Mitral Valve: The mitral valve is grossly normal. Trivial mitral valve regurgitation. Tricuspid Valve: The tricuspid valve is grossly normal. Tricuspid valve regurgitation is mild. Aortic Valve: The aortic valve is tricuspid. Aortic valve regurgitation is not visualized. Aortic valve peak gradient measures 8.6 mmHg. Pulmonic Valve: The pulmonic valve was grossly normal. Pulmonic valve regurgitation is not visualized. Pulmonic regurgitation is not visualized. Aorta: The aortic root is normal in size and structure. IAS/Shunts: The atrial septum is grossly normal.  LEFT VENTRICLE PLAX 2D LVIDd:         4.29 cm  Diastology LVIDs:         3.05 cm  LV e' lateral:   7.29 cm/s LV PW:         1.23 cm  LV E/e' lateral: 9.5 LV IVS:        1.21 cm  LV e' medial:    7.51 cm/s LVOT diam:     2.30 cm  LV E/e' medial:  9.2 LV SV:         46 ml LV SV Index:   19.43 LVOT Area:     4.15 cm  RIGHT VENTRICLE  RV Basal diam:  3.09 cm RV S prime:     11.40 cm/s TAPSE (M-mode): 2.1 cm LEFT ATRIUM           Index      RIGHT ATRIUM           Index LA diam:      2.30 cm 0.99 cm/m RA Area:      15.70 cm LA Vol (A2C): 18.9 ml 8.13 ml/m RA Volume:   41.10 ml  17.69 ml/m LA Vol (A4C): 12.0 ml 5.16 ml/m  AORTIC VALVE                PULMONIC VALVE AV Area (Vmax): 2.46 cm    PV Vmax:       1.03 m/s AV Vmax:        147.00 cm/s PV Peak grad:  4.2 mmHg AV Peak Grad:   8.6 mmHg LVOT Vmax:      87.20 cm/s LVOT Vmean:     57.100 cm/s LVOT VTI:       0.166 m  AORTA Ao Root diam: 3.70 cm Ao Asc diam:  3.30 cm MV E velocity: 69.40 cm/s 103 cm/s  TRICUSPID VALVE MV A velocity: 70.30 cm/s 70.3 cm/s TV Peak grad:   36.2 mmHg MV E/A ratio:  0.99       1.5       TV Vmax:        3.01 m/s                                      SHUNTS                                     Systemic VTI:  0.17 m                                     Systemic Diam: 2.30 cm  Bartholome Bill MD Electronically signed by Bartholome Bill MD Signature Date/Time: 12/30/2019/8:26:01 AM    Final     Assessment/Plan: 2 Days Post-Op   Active Problems:   Closed hip fracture requiring operative repair, right, sequela   Complication of Foley catheter ([place])   Leukocytosis  Patient remained stable.  He is making progress with physical therapy.  Patient ordered for 1 unit of PRBC by hospitalist for hgb of 7.7.  Patient on Plavix at baseline and aspirin added for DVT prophylaxis.  Continue physical therapy for hip range of motion, lower extremity strengthening and gait training.  Patient is weight-bear as tolerated on the right lower extremity along with posterior hip precautions.  Will follow up with Dr. Mack Guise at [facility] within 10 to 14 days after discharge.  Phone number for [facility] to make an appointment is [phone].    Thornton Park , MD 12/30/2019, 3:25 PM

## 2019-12-30 NOTE — Progress Notes (Addendum)
Patient pulled on indwelling catheter, clear tubing disconnected from the yellow insertion tubing. Cleaned/ reconnected. Urine with bright red blood is leaking out of the penis. Urology placed this indwelling catheter. Notified on call provider Dr. Prudy Feeler, stated she would assess the patient. VS WNL. No acute distress, will monitor.

## 2019-12-30 NOTE — Plan of Care (Signed)
  Problem: Education: Goal: Verbalization of understanding the information provided (i.e., activity precautions, restrictions, etc) will improve Outcome: Progressing Goal: Individualized Educational Video(s) Outcome: Progressing   Problem: Clinical Measurements: Goal: Postoperative complications will be avoided or minimized Outcome: Progressing   Problem: Education: Goal: Knowledge of secondary prevention will improve Outcome: Progressing Goal: Knowledge of patient specific risk factors addressed and post discharge goals established will improve Outcome: Progressing Goal: Individualized Educational Video(s) Outcome: Progressing

## 2019-12-30 NOTE — Progress Notes (Signed)
Physical Therapy Treatment Patient Details Name: Grant Mcdaniel MRN: [id number] DOB: 1941/06/08 Today's Date: 12/30/2019    History of Present Illness Patient is a 79 year old male who was admitted to [facility] for a R hip hemiarthroplasty secondary to a right femoral neck fracture. Pt. PMHx includes: CAD, CKD, DM with CKD stage III, Hypercholesterolemia, HTN, hypothyroid, PVD, and falls.    PT Comments    Pt ready for session.  Awaiting blood transfusion.  Participated in exercises as described below.  To edge of bed with mod a x 1.  Once sitting, he is able to sit without assist x 12 minutes.  He is able to lateral scoot in sitting towards HOB with min/mod a x 1 for improved positioning in supine.  Returned to supine with mod a x 1 primarily for LE assist.  Positioned for comfort and safety.     Follow Up Recommendations  SNF     Equipment Recommendations  None recommended by PT    Recommendations for Other Services       Precautions / Restrictions Precautions Precautions: Posterior Hip Precaution Comments: WBAT Restrictions Weight Bearing Restrictions: Yes RLE Weight Bearing: Weight bearing as tolerated Other Position/Activity Restrictions: new CVA with hip fx    Mobility  Bed Mobility Overal bed mobility: Needs Assistance Bed Mobility: Supine to Sit;Sit to Supine     Supine to sit: Mod assist Sit to supine: Mod assist;Max assist      Transfers Overall transfer level: Needs assistance Equipment used: 1 person hand held assist Transfers: Lateral/Scoot Transfers           General transfer comment: deferred standing, was able to scoot in sitting towards [facility] with good effort.  Ambulation/Gait                 Stairs             Wheelchair Mobility    Modified Rankin (Stroke Patients Only)       Balance Overall balance assessment: Needs assistance Sitting-balance support: Bilateral upper extremity supported;Feet supported Sitting  balance-Leahy Scale: Fair Sitting balance - Comments: able to sit with supervision today.  unsafe to be left unattended in sitting,       Standing balance comment: deferred                            Cognition Arousal/Alertness: Awake/alert Behavior During Therapy: WFL for tasks assessed/performed Overall Cognitive Status: Within Functional Limits for tasks assessed                                        Exercises Other Exercises Other Exercises: supine BLE AAROM ankle pumps, heel slides, ab/add and SLR, sitting x 10 for ankle pumps and LAQ    General Comments        Pertinent Vitals/Pain Pain Assessment: Faces Faces Pain Scale: Hurts little more Pain Location: Right hip Pain Descriptors / Indicators: Sore;Grimacing Pain Intervention(s): Limited activity within patient's tolerance;Monitored during session    Home Living                      Prior Function            PT Goals (current goals can now be found in the care plan section) Progress towards PT goals: Progressing toward goals    Frequency  BID      PT Plan Current plan remains appropriate    Co-evaluation              AM-PAC PT "6 Clicks" Mobility   Outcome Measure  Help needed turning from your back to your side while in a flat bed without using bedrails?: A Lot Help needed moving from lying on your back to sitting on the side of a flat bed without using bedrails?: A Lot Help needed moving to and from a bed to a chair (including a wheelchair)?: Total Help needed standing up from a chair using your arms (e.g., wheelchair or bedside chair)?: A Lot Help needed to walk in hospital room?: Total Help needed climbing 3-5 steps with a railing? : Total 6 Click Score: 9    End of Session   Activity Tolerance: Patient tolerated treatment well Patient left: in bed;with call bell/phone within reach;with bed alarm set;with SCD's reapplied Nurse Communication:  Mobility status;Weight bearing status;Other (comment)       Time: [id number] PT Time Calculation (min) (ACUTE ONLY): 23 min  Charges:  $Therapeutic Exercise: 8-22 mins $Therapeutic Activity: 8-22 mins                    Chesley Noon, PTA 12/30/19, 1:40 PM

## 2019-12-31 ENCOUNTER — Inpatient Hospital Stay: Payer: Medicare Other

## 2019-12-31 ENCOUNTER — Encounter: Payer: Self-pay | Admitting: Internal Medicine

## 2019-12-31 DIAGNOSIS — T839XXS Unspecified complication of genitourinary prosthetic device, implant and graft, sequela: Secondary | ICD-10-CM

## 2019-12-31 DIAGNOSIS — S72001A Fracture of unspecified part of neck of right femur, initial encounter for closed fracture: Secondary | ICD-10-CM

## 2019-12-31 DIAGNOSIS — S72001S Fracture of unspecified part of neck of right femur, sequela: Secondary | ICD-10-CM

## 2019-12-31 DIAGNOSIS — T839XXD Unspecified complication of genitourinary prosthetic device, implant and graft, subsequent encounter: Secondary | ICD-10-CM

## 2019-12-31 DIAGNOSIS — D62 Acute posthemorrhagic anemia: Secondary | ICD-10-CM

## 2019-12-31 DIAGNOSIS — R310 Gross hematuria: Secondary | ICD-10-CM

## 2019-12-31 DIAGNOSIS — T839XXA Unspecified complication of genitourinary prosthetic device, implant and graft, initial encounter: Secondary | ICD-10-CM

## 2019-12-31 LAB — CBC
HCT: 26.3 % — ABNORMAL LOW (ref 39.0–52.0)
Hemoglobin: 8.7 g/dL — ABNORMAL LOW (ref 13.0–17.0)
MCH: 29 pg (ref 26.0–34.0)
MCHC: 33.1 g/dL (ref 30.0–36.0)
MCV: 87.7 fL (ref 80.0–100.0)
Platelets: 146 10*3/uL — ABNORMAL LOW (ref 150–400)
RBC: 3 MIL/uL — ABNORMAL LOW (ref 4.22–5.81)
RDW: 14.3 % (ref 11.5–15.5)
WBC: 11.7 10*3/uL — ABNORMAL HIGH (ref 4.0–10.5)
nRBC: 0 % (ref 0.0–0.2)

## 2019-12-31 LAB — BASIC METABOLIC PANEL
Anion gap: 10 (ref 5–15)
BUN: 29 mg/dL — ABNORMAL HIGH (ref 8–23)
CO2: 23 mmol/L (ref 22–32)
Calcium: 9.6 mg/dL (ref 8.9–10.3)
Chloride: 98 mmol/L (ref 98–111)
Creatinine, Ser: 1.08 mg/dL (ref 0.61–1.24)
GFR calc Af Amer: >60 mL/min (ref >60)
GFR calc non Af Amer: >60 mL/min (ref >60)
Glucose, Bld: 140 mg/dL — ABNORMAL HIGH (ref 70–99)
Potassium: 4.2 mmol/L (ref 3.5–5.1)
Sodium: 131 mmol/L — ABNORMAL LOW (ref 135–145)

## 2019-12-31 LAB — TYPE AND SCREEN
ABO/RH(D): A POS
Antibody Screen: NEGATIVE
Unit division: 0

## 2019-12-31 LAB — BPAM RBC
Blood Product Expiration Date: 202101192359
ISSUE DATE / TIME: 202101101342
Unit Type and Rh: 600

## 2019-12-31 LAB — GLUCOSE, CAPILLARY
Glucose-Capillary: 127 mg/dL — ABNORMAL HIGH (ref 70–99)
Glucose-Capillary: 204 mg/dL — ABNORMAL HIGH (ref 70–99)
Glucose-Capillary: 126 mg/dL — ABNORMAL HIGH (ref 70–99)
Glucose-Capillary: 135 mg/dL — ABNORMAL HIGH (ref 70–99)

## 2019-12-31 LAB — SURGICAL PATHOLOGY

## 2019-12-31 MED ORDER — IOHEXOL 350 MG/ML SOLN
75.0000 mL | Freq: Once | INTRAVENOUS | Status: AC | PRN
  Administered 2019-12-31: 75 mL via INTRAVENOUS

## 2019-12-31 NOTE — Progress Notes (Signed)
Reason for consult: Stroke  Subjective: Patient lying comfortably in bed, has been walking with assistance of walker, denies any dizziness.    ROS: negative except above   Examination  Vital signs in last 24 hours: Temp:  [97.5 F (36.4 C)-98.6 F (37 C)] 97.7 F (36.5 C) (01/11 0856) Pulse Rate:  [67-81] 70 (01/11 0856) Resp:  [16-18] 18 (01/11 0856) BP: (135-158)/(54-81) 146/67 (01/11 0856) SpO2:  [93 %-99 %] 99 % (01/11 0856)  General: lying in bed CVS: pulse-normal rate and rhythm RS: breathing comfortably Extremities: normal   Neuro: MS: Alert, oriented, follows commands CN: pupils equal and reactive,  EOMI, face symmetric, tongue midline, normal sensation over face, Motor: 5/5 strength in all 4 extremities Reflexes: 2+ bilaterally over patella, biceps, plantars: flexor Coordination: no dysmetria noted, heel to shin normal on left leg ( limited with right leg due to hip arthroplasty)  Gait: not tested  Basic Metabolic Panel: Recent Labs  Lab 12/27/19 1719 12/28/19 0521 12/29/19 0513 12/30/19 0341 12/31/19 0502  NA 138 138 138 135 131*  K 4.6 4.3 4.4 4.8 4.2  CL 101 102 103 100 98  CO2 24 25 26 24 23   GLUCOSE 177* 153* 150* 148* 140*  BUN 26* 26* 29* 30* 29*  CREATININE 1.44* 1.42* 1.44* 1.37* 1.08  CALCIUM 9.7 9.4 8.9 9.5 9.6    CBC: Recent Labs  Lab 12/27/19 1719 12/28/19 0248 12/29/19 0513 12/30/19 0341 12/31/19 0502  WBC 16.2* 10.9* 10.2 10.9* 11.7*  NEUTROABS 13.8*  --   --   --   --   HGB 11.9* 11.0* 7.7* 7.7* 8.7*  HCT 38.8* 33.5* 24.0* 23.9* 26.3*  MCV 93.5 88.9 90.6 89.5 87.7  PLT 167 148* 128* 132* 146*     Coagulation Studies: No results for input(s): LABPROT, INR in the last 72 hours.  Imaging Reviewed:   MRI brain: Shows small cerebellar infarct  ASSESSMENT AND PLAN  79 year old male admitted with hip fracture, had balance difficulty as well and MRI brain noted to have acute infarct in the right cerebellum.  Likely small  vessel disease. Patient on aspirin and plavix prior to admission.  A1c 7.3, LDL 57. Echocardiogram shows no thrombus, normal ejection fraction and normal size of left atrium.  Acute right cerebellar infarct: Likely small vessel disease  Recommendations CTA head and neck: negative for vertebral artery stenosis, no significant atherosclerotic disease  Okay to discontinue Plavix if patient has having acute hematuria from stroke standpoint and continue ASA alone  Continue current dose of 40 mg atorvastatin for stroke prevention PT/OT  30 day holter outpatient to assess for pAfib  F/U with outpatient neurologist in 4- 6 weeks     Nidia Grogan [facility] Pager Number [phone] For questions after 7pm please refer to AMION to reach the Neurologist on call

## 2019-12-31 NOTE — Progress Notes (Signed)
SLP Cancellation Note  Patient Details Name: Grant Mcdaniel MRN: [id number] DOB: 1941-04-14   Cancelled treatment:       Reason Eval/Treat Not Completed: SLP screened, no needs identified, will sign off(chart reviewed; consulted NSG then met w/ pt in room). Noted MIR results indicating an acute Cerebellar infarct with old infarcts. Pt sitting in his chair upon entering room. He stated he had just talked w/ the Dining Services and ordered his lunch meal. Pt denied any difficulty swallowing and is currently on a regular diet; tolerates swallowing pills w/ water per NSG. Pt conversed at conversational level w/out deficits noted; pt and NSG denied any speech-language deficits, or difficulty indicating wants/needs today.  No further skilled ST services indicated as pt appears at his baseline. Pt agreed. NSG to reconsult if any change in status while admitted.    [name], MS, CCC-SLP Kia Stavros 12/31/2019, 12:13 PM

## 2019-12-31 NOTE — Progress Notes (Signed)
Subjective:  POD #3 s/p right hip hemiarthroplasty.   Patient reports right hip pain as mild.  Patient has no other complaints.  Objective:   VITALS:   Vitals:   12/31/19 0147 12/31/19 0514 12/31/19 0856 12/31/19 1325  BP: (!) 158/81 135/60 (!) 146/67   Pulse: 81 68 70   Resp: 16  18   Temp: 98.6 F (37 C) (!) 97.5 F (36.4 C) 97.7 F (36.5 C)   TempSrc: Oral Oral Oral   SpO2: 99% 96% 99% 94%  Weight:      Height:        PHYSICAL EXAM: Right lower extremity Neurovascular intact Sensation intact distally Intact pulses distally Dorsiflexion/Plantar flexion intact Incision: moderate drainage No cellulitis present Compartment soft  LABS  Results for orders placed or performed during the hospital encounter of 12/27/19 (from the past 24 hour(s))  Glucose, capillary     Status: Abnormal   Collection Time: 12/30/19  4:40 PM  Result Value Ref Range   Glucose-Capillary 146 (H) 70 - 99 mg/dL  Glucose, capillary     Status: Abnormal   Collection Time: 12/30/19  9:09 PM  Result Value Ref Range   Glucose-Capillary 161 (H) 70 - 99 mg/dL   Comment 1 Notify RN   AM Labs cbc     Status: Abnormal   Collection Time: 12/31/19  5:02 AM  Result Value Ref Range   WBC 11.7 (H) 4.0 - 10.5 K/uL   RBC 3.00 (L) 4.22 - 5.81 MIL/uL   Hemoglobin 8.7 (L) 13.0 - 17.0 g/dL   HCT 26.3 (L) 39.0 - 52.0 %   MCV 87.7 80.0 - 100.0 fL   MCH 29.0 26.0 - 34.0 pg   MCHC 33.1 30.0 - 36.0 g/dL   RDW 14.3 11.5 - 15.5 %   Platelets 146 (L) 150 - 400 K/uL   nRBC 0.0 0.0 - 0.2 %  AM Labs bmp     Status: Abnormal   Collection Time: 12/31/19  5:02 AM  Result Value Ref Range   Sodium 131 (L) 135 - 145 mmol/L   Potassium 4.2 3.5 - 5.1 mmol/L   Chloride 98 98 - 111 mmol/L   CO2 23 22 - 32 mmol/L   Glucose, Bld 140 (H) 70 - 99 mg/dL   BUN 29 (H) 8 - 23 mg/dL   Creatinine, Ser 1.08 0.61 - 1.24 mg/dL   Calcium 9.6 8.9 - 10.3 mg/dL   GFR calc non Af Amer >60 >60 mL/min   GFR calc Af Amer >60 >60 mL/min    Anion gap 10 5 - 15  Glucose, capillary     Status: Abnormal   Collection Time: 12/31/19  8:58 AM  Result Value Ref Range   Glucose-Capillary 127 (H) 70 - 99 mg/dL  Glucose, capillary     Status: Abnormal   Collection Time: 12/31/19 11:50 AM  Result Value Ref Range   Glucose-Capillary 204 (H) 70 - 99 mg/dL    CT ANGIO HEAD W OR WO CONTRAST  Result Date: 12/31/2019 CLINICAL DATA:  Stroke, follow-up EXAM: CT ANGIOGRAPHY HEAD AND NECK TECHNIQUE: Multidetector CT imaging of the head and neck was performed using the standard protocol during bolus administration of intravenous contrast. Multiplanar CT image reconstructions and MIPs were obtained to evaluate the vascular anatomy. Carotid stenosis measurements (when applicable) are obtained utilizing NASCET criteria, using the distal internal carotid diameter as the denominator. CONTRAST:  34mL OMNIPAQUE IOHEXOL 350 MG/ML SOLN COMPARISON:  12/17/2019 head CT, 12/29/2019 MRI FINDINGS:  CT HEAD FINDINGS Brain: There is no acute intracranial hemorrhage. Small chronic cerebellar infarcts identified. More recent small right cerebellar infarct is better seen on MRI. Gray-white differentiation is preserved. Ventricles are stable in size. No extra-axial collection. Vascular: There is mild intracranial atherosclerotic calcification at the skull base. Skull: Unremarkable. Sinuses: Mild mucosal thickening. Orbits: Orbits are unremarkable. Review of the MIP images confirms the above findings CTA NECK FINDINGS Aortic arch: Mild calcified plaque. Great vessel origins are patent. Right carotid system: Patent. Mild calcified plaque at the ICA origin without measurable stenosis. Left carotid system: Patent. No measurable stenosis at the ICA origin. Vertebral arteries: Patent and codominant. Calcified plaque is present at the left vertebral artery origin with mild stenosis. Skeleton: Multilevel degenerative changes of the cervical spine. There are bulky anterior bridging  osteophytes with indentation of the posterior pharynx and esophagus. Other neck: No neck mass or adenopathy. Upper chest: No apical lung mass. Review of the MIP images confirms the above findings CTA HEAD FINDINGS Anterior circulation: Intracranial internal carotid arteries patent with calcified plaque causing mild stenosis. Anterior and middle cerebral arteries are patent. Posterior circulation: Intracranial vertebral arteries are patent. Posterior inferior cerebellar arteries are patent. Basilar artery is patent. Superior cerebellar arteries are patent. Posterior cerebral arteries are patent. Venous sinuses: As permitted by contrast timing, patent. Review of the MIP images confirms the above findings IMPRESSION: No acute intracranial hemorrhage or new loss of gray-white differentiation. Recent small cerebellar infarct is better seen on MRI. No hemodynamically significant stenosis or evidence of dissection. Electronically Signed   By: Macy Mis M.D.   On: 12/31/2019 13:31   CT ANGIO NECK W OR WO CONTRAST  Result Date: 12/31/2019 CLINICAL DATA:  Stroke, follow-up EXAM: CT ANGIOGRAPHY HEAD AND NECK TECHNIQUE: Multidetector CT imaging of the head and neck was performed using the standard protocol during bolus administration of intravenous contrast. Multiplanar CT image reconstructions and MIPs were obtained to evaluate the vascular anatomy. Carotid stenosis measurements (when applicable) are obtained utilizing NASCET criteria, using the distal internal carotid diameter as the denominator. CONTRAST:  78mL OMNIPAQUE IOHEXOL 350 MG/ML SOLN COMPARISON:  12/17/2019 head CT, 12/29/2019 MRI FINDINGS: CT HEAD FINDINGS Brain: There is no acute intracranial hemorrhage. Small chronic cerebellar infarcts identified. More recent small right cerebellar infarct is better seen on MRI. Gray-white differentiation is preserved. Ventricles are stable in size. No extra-axial collection. Vascular: There is mild intracranial  atherosclerotic calcification at the skull base. Skull: Unremarkable. Sinuses: Mild mucosal thickening. Orbits: Orbits are unremarkable. Review of the MIP images confirms the above findings CTA NECK FINDINGS Aortic arch: Mild calcified plaque. Great vessel origins are patent. Right carotid system: Patent. Mild calcified plaque at the ICA origin without measurable stenosis. Left carotid system: Patent. No measurable stenosis at the ICA origin. Vertebral arteries: Patent and codominant. Calcified plaque is present at the left vertebral artery origin with mild stenosis. Skeleton: Multilevel degenerative changes of the cervical spine. There are bulky anterior bridging osteophytes with indentation of the posterior pharynx and esophagus. Other neck: No neck mass or adenopathy. Upper chest: No apical lung mass. Review of the MIP images confirms the above findings CTA HEAD FINDINGS Anterior circulation: Intracranial internal carotid arteries patent with calcified plaque causing mild stenosis. Anterior and middle cerebral arteries are patent. Posterior circulation: Intracranial vertebral arteries are patent. Posterior inferior cerebellar arteries are patent. Basilar artery is patent. Superior cerebellar arteries are patent. Posterior cerebral arteries are patent. Venous sinuses: As permitted by contrast timing, patent. Review of  the MIP images confirms the above findings IMPRESSION: No acute intracranial hemorrhage or new loss of gray-white differentiation. Recent small cerebellar infarct is better seen on MRI. No hemodynamically significant stenosis or evidence of dissection. Electronically Signed   By: Macy Mis M.D.   On: 12/31/2019 13:31   US RENAL  Result Date: 12/30/2019 CLINICAL DATA:  Hematuria EXAM: RENAL / URINARY TRACT ULTRASOUND COMPLETE COMPARISON:  None. FINDINGS: Right Kidney: Renal measurements: 12 x 4.2 x 5.0 cm = volume: 134 mL. Mildly echogenic cortex. Left Kidney: Renal measurements: 10.9 x 4.5 x  4.9 cm = volume: 128 mL. Limited evaluation. Increased cortical echogenicity in a mildly nodular contour. Bladder: Decompressed with a Foley catheter. Other: None. IMPRESSION: 1. Mildly echogenic cortex bilaterally suggesting the possibility medical renal disease. Evaluation of the left kidney is limited. 2. The bladder is decompressed with a Foley catheter. Electronically Signed   By: Dorise Bullion III M.D   On: 12/30/2019 12:19   ECHOCARDIOGRAM COMPLETE  Result Date: 12/30/2019   ECHOCARDIOGRAM REPORT   Patient Name:   SYIRE DACKO Date of Exam: 12/29/2019 Medical Rec #:  [id number]        Height:       74.0 in Accession #:    [id number]       Weight:       234.0 lb Date of Birth:  [birth year]        BSA:          2.32 m Patient Age:    79 years         BP:           135/56 mmHg Patient Gender: M                HR:           76 bpm. Exam Location:  [facility] Procedure: 2D Echo Indications:     STROKE 434.91/ I163.9  History:         Patient has no prior history of Echocardiogram examinations.  Sonographer:     Arville Go RDCS Referring Phys:  [place] Diagnosing Phys: Bartholome Bill MD IMPRESSIONS  1. Left ventricular ejection fraction, by visual estimation, is 55 to 60%. The left ventricle has normal function. Left ventricular septal wall thickness was mildly increased. There is borderline left ventricular hypertrophy.  2. The left ventricle has no regional wall motion abnormalities.  3. Global right ventricle has normal systolic function.The right ventricular size is normal. No increase in right ventricular wall thickness.  4. Left atrial size was normal.  5. Right atrial size was normal.  6. The mitral valve is grossly normal. Trivial mitral valve regurgitation.  7. The tricuspid valve is grossly normal.  8. The aortic valve is tricuspid. Aortic valve regurgitation is not visualized.  9. The pulmonic valve was grossly normal. Pulmonic valve regurgitation is not visualized. 10. The atrial septum  is grossly normal. FINDINGS  Left Ventricle: Left ventricular ejection fraction, by visual estimation, is 55 to 60%. The left ventricle has normal function. The left ventricle has no regional wall motion abnormalities. There is borderline left ventricular hypertrophy. Right Ventricle: The right ventricular size is normal. No increase in right ventricular wall thickness. Global RV systolic function is has normal systolic function. Left Atrium: Left atrial size was normal in size. Right Atrium: Right atrial size was normal in size Pericardium: There is no evidence of pericardial effusion. Mitral Valve: The mitral valve is grossly normal. Trivial mitral valve  regurgitation. Tricuspid Valve: The tricuspid valve is grossly normal. Tricuspid valve regurgitation is mild. Aortic Valve: The aortic valve is tricuspid. Aortic valve regurgitation is not visualized. Aortic valve peak gradient measures 8.6 mmHg. Pulmonic Valve: The pulmonic valve was grossly normal. Pulmonic valve regurgitation is not visualized. Pulmonic regurgitation is not visualized. Aorta: The aortic root is normal in size and structure. IAS/Shunts: The atrial septum is grossly normal.  LEFT VENTRICLE PLAX 2D LVIDd:         4.29 cm  Diastology LVIDs:         3.05 cm  LV e' lateral:   7.29 cm/s LV PW:         1.23 cm  LV E/e' lateral: 9.5 LV IVS:        1.21 cm  LV e' medial:    7.51 cm/s LVOT diam:     2.30 cm  LV E/e' medial:  9.2 LV SV:         46 ml LV SV Index:   19.43 LVOT Area:     4.15 cm  RIGHT VENTRICLE RV Basal diam:  3.09 cm RV S prime:     11.40 cm/s TAPSE (M-mode): 2.1 cm LEFT ATRIUM           Index      RIGHT ATRIUM           Index LA diam:      2.30 cm 0.99 cm/m RA Area:     15.70 cm LA Vol (A2C): 18.9 ml 8.13 ml/m RA Volume:   41.10 ml  17.69 ml/m LA Vol (A4C): 12.0 ml 5.16 ml/m  AORTIC VALVE                PULMONIC VALVE AV Area (Vmax): 2.46 cm    PV Vmax:       1.03 m/s AV Vmax:        147.00 cm/s PV Peak grad:  4.2 mmHg AV Peak  Grad:   8.6 mmHg LVOT Vmax:      87.20 cm/s LVOT Vmean:     57.100 cm/s LVOT VTI:       0.166 m  AORTA Ao Root diam: 3.70 cm Ao Asc diam:  3.30 cm MV E velocity: 69.40 cm/s 103 cm/s  TRICUSPID VALVE MV A velocity: 70.30 cm/s 70.3 cm/s TV Peak grad:   36.2 mmHg MV E/A ratio:  0.99       1.5       TV Vmax:        3.01 m/s                                      SHUNTS                                     Systemic VTI:  0.17 m                                     Systemic Diam: 2.30 cm  Bartholome Bill MD Electronically signed by Bartholome Bill MD Signature Date/Time: 12/30/2019/8:26:01 AM    Final     Assessment/Plan: 3 Days Post-Op   Active Problems:   Closed hip fracture requiring operative repair, right, sequela   Complication of Foley catheter (  [place])   Leukocytosis  Patient will require a dressing change today.  He has ecchymosis and swelling of the right hip but compartments are soft.  The patient will continue physical and occupational therapy.  He will require skilled nursing facility upon discharge.  He will follow-up with Dr. Mack Guise in 10 to 14 days postop.  Continue Plavix and aspirin for DVT prophylaxis.   Thornton Park , MD 12/31/2019, 2:20 PM

## 2019-12-31 NOTE — Plan of Care (Signed)
  Problem: Education: Goal: Verbalization of understanding the information provided (i.e., activity precautions, restrictions, etc) will improve Outcome: Progressing Goal: Individualized Educational Video(s) Outcome: Progressing   Problem: Clinical Measurements: Goal: Postoperative complications will be avoided or minimized Outcome: Progressing   Problem: Education: Goal: Knowledge of secondary prevention will improve Outcome: Progressing Goal: Knowledge of patient specific risk factors addressed and post discharge goals established will improve Outcome: Progressing Goal: Individualized Educational Video(s) Outcome: Progressing   Problem: Education: Goal: Knowledge of General Education information will improve Description: Including pain rating scale, medication(s)/side effects and non-pharmacologic comfort measures Outcome: Progressing   Problem: Clinical Measurements: Goal: Ability to maintain clinical measurements within normal limits will improve Outcome: Progressing   Problem: Activity: Goal: Risk for activity intolerance will decrease Outcome: Progressing   Problem: Pain Managment: Goal: General experience of comfort will improve Outcome: Progressing   Problem: Safety: Goal: Ability to remain free from injury will improve Outcome: Progressing   Problem: Skin Integrity: Goal: Risk for impaired skin integrity will decrease Outcome: Progressing

## 2019-12-31 NOTE — Progress Notes (Signed)
PROGRESS NOTE                                                                                                                                                                                                             Patient Demographics:    Grant Mcdaniel, is a 79 y.o. male, DOB - February 07, 1941, XO:[id number]  Admit date - 12/27/2019   Admitting Physician Loletha Grayer, MD  Outpatient Primary MD for the patient is [facility]  LOS - 4  Outpatient Specialists: None  Chief Complaint  Patient presents with  . Fall       Brief Narrative 79 year old male with history of coronary artery disease, diabetes mellitus, hypertension, hypothyroidism, PVD, chronic kidney disease stage IIIa presented to the hospital after tripping on his driveway and fell on his right side.  2 weeks back he also had a fall bruising his right eye and forehead.  At that time head CT done was negative for any bleeding or injury.  X-ray done in the ED showed right hip fracture.  Patient admitted to hospital service and underwent right hip hemiarthroplasty on 1/8. On 1/9, when physical therapist was evaluating the patient she noticed patient to have poor coordination in his left leg.  MRI brain was done showing acute small right cerebellar stroke.  Subjective:   Still having some hematuria.  No other overnight events.  Assessment  & Plan :   Principal problems:   Closed hip fracture requiring operative repair, right, sequela Pain better controlled.  Continue as needed Vicodin and Robaxin.  Continue bowel regimen.  Orthopedics recommends full dose aspirin 325 mg twice daily for DVT prophylaxis (on hold due to hematuria).  Acute right cerebellar stroke ([place]) Recent fall at home hitting her right side of the head 2 weeks back.  I left leg coordination during PT postop. MRI done showing right cerebellar stroke.  Stroke work-up  initiated. 2D echo with normal EF and no wall motion abnormality or thrombus.  Carotid ultrasound without significant stenosis. Patient chronically on Plavix for PVD.  Neurology recommended dual antiplatelet therapy with aspirin and Plavix, however both on hold due to hematuria.  Continue statin (LDL of 57). I discussed with neurology today and given active hematuria, recommends using aspirin alone for now.  CT angiogram head and neck done negative for vertebral artery stenosis.  Also recommends 30-day Holter monitoring, to rule out possible A. fib contributing to an embolic stroke.    Active problems   Complication of Foley catheter [facility]) Patient has BPH on Flomax.  Patient had Foley placed in with balloon blown up into the prostate.  It was removed and urology placed a new catheter and recommends it to be in for 1 week. Noted for persistent hematuria past 48 hours.  Will ask urology to evaluate.  Acute postoperative blood loss anemia Hemoglobin dropped to 7.7 with persistent hematuria.  Received 1 unit PRBC.  Aspirin Plavix on hold.  Will  Hypothyroidism Continue Synthroid  History of CAD and peripheral vascular disease Continue statin.  Plavix on hold.  Essential hypertension Stable.  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  Continue sliding scale coverage     Code Status : DNR  Family Communication  : None  Disposition Plan  : SNF possibly tomorrow if H&H stable, neurology evaluation.  Barriers For Discharge : Active symptoms (postoperative, stroke)  Consults  : Orthopedic, neurology, urology  Procedures  : Right hemiarthroplasty, MRI brain  DVT Prophylaxis  :  Lovenox -  Lab Results  Component Value Date   PLT 146 (L) 12/31/2019    Antibiotics    Anti-infectives (From admission, onward)   Start     Dose/Rate Route Frequency Ordered Stop   12/28/19 1400  ceFAZolin (ANCEF) IVPB 1 g/50 mL premix     1 g 100  mL/hr over 30 Minutes Intravenous Every 6 hours 12/28/19 1151 12/29/19 1900   12/28/19 0747  ceFAZolin (ANCEF) 2-4 GM/100ML-% IVPB    Note to Pharmacy: Norton Blizzard  : cabinet override      12/28/19 0747 12/28/19 0827   12/27/19 2019  ceFAZolin (ANCEF) IVPB 2g/100 mL premix     2 g 200 mL/hr over 30 Minutes Intravenous 30 min pre-op 12/27/19 2019 12/28/19 0835        Objective:   Vitals:   12/31/19 0147 12/31/19 0514 12/31/19 0856 12/31/19 1325  BP: (!) 158/81 135/60 (!) 146/67   Pulse: 81 68 70   Resp: 16  18   Temp: 98.6 F (37 C) (!) 97.5 F (36.4 C) 97.7 F (36.5 C)   TempSrc: Oral Oral Oral   SpO2: 99% 96% 99% 94%  Weight:      Height:        Wt Readings from Last 3 Encounters:  12/27/19 106.1 kg  12/27/19 106.1 kg  12/17/19 104.3 kg     Intake/Output Summary (Last 24 hours) at 12/31/2019 1423 Last data filed at 12/31/2019 1350 Gross per 24 hour  Intake 720 ml  Output 1150 ml  Net -430 ml   Physical exam Elderly male not in distress HEENT: Pallor present, moist mucosa Chest: Clear CVs: S1-S2 GI: Soft, nondistended nontender, Foley draining bloody urine Musculoskeletal: Clean dressing over right hip CNS: Alert oriented, no focal weakness     Data Review:    CBC Recent Labs  Lab 12/27/19 1719 12/28/19 0248 12/29/19 0513 12/30/19 0341 12/31/19 0502  WBC 16.2* 10.9* 10.2 10.9* 11.7*  HGB 11.9* 11.0* 7.7* 7.7* 8.7*  HCT 38.8* 33.5* 24.0* 23.9* 26.3*  PLT 167 148* 128* 132* 146*  MCV 93.5 88.9 90.6 89.5 87.7  MCH 28.7 29.2 29.1 28.8 29.0  MCHC 30.7 32.8 32.1 32.2 33.1  RDW 14.1 14.1 14.5 14.4 14.3  LYMPHSABS 1.4  --   --   --   --  MONOABS 0.8  --   --   --   --   EOSABS 0.0  --   --   --   --   BASOSABS 0.0  --   --   --   --     Chemistries  Recent Labs  Lab 12/27/19 1719 12/28/19 0521 12/29/19 0513 12/30/19 0341 12/31/19 0502  NA 138 138 138 135 131*  K 4.6 4.3 4.4 4.8 4.2  CL 101 102 103 100 98  CO2 24 25 26 24 23    GLUCOSE 177* 153* 150* 148* 140*  BUN 26* 26* 29* 30* 29*  CREATININE 1.44* 1.42* 1.44* 1.37* 1.08  CALCIUM 9.7 9.4 8.9 9.5 9.6  AST 37  --   --   --   --   ALT 16  --   --   --   --   ALKPHOS 101  --   --   --   --   BILITOT 1.8*  --   --   --   --    ------------------------------------------------------------------------------------------------------------------ Recent Labs    12/30/19 0341  CHOL 113  HDL 30*  LDLCALC 57  TRIG 129  CHOLHDL 3.8    Lab Results  Component Value Date   HGBA1C 7.3 (H) 12/27/2019   ------------------------------------------------------------------------------------------------------------------ No results for input(s): TSH, T4TOTAL, T3FREE, THYROIDAB in the last 72 hours.  Invalid input(s): FREET3 ------------------------------------------------------------------------------------------------------------------ No results for input(s): VITAMINB12, FOLATE, FERRITIN, TIBC, IRON, RETICCTPCT in the last 72 hours.  Coagulation profile No results for input(s): INR, PROTIME in the last 168 hours.  No results for input(s): DDIMER in the last 72 hours.  Cardiac Enzymes No results for input(s): CKMB, TROPONINI, MYOGLOBIN in the last 168 hours.  Invalid input(s): CK ------------------------------------------------------------------------------------------------------------------ No results found for: BNP  Inpatient Medications  Scheduled Meds: .  stroke: mapping our early stages of recovery book   Does not apply Once  . amLODipine  10 mg Oral Daily   And  . benazepril  40 mg Oral Daily  . aspirin EC  325 mg Oral BID  . atorvastatin  40 mg Oral q1800  . bisoprolol-hydrochlorothiazide  1 tablet Oral Daily  . Chlorhexidine Gluconate Cloth  6 each Topical Daily  . clopidogrel  75 mg Oral Daily  . docusate sodium  100 mg Oral BID  . fluticasone  1 spray Each Nare Daily  . insulin aspart  0-5 Units Subcutaneous QHS  . insulin aspart  0-9 Units  Subcutaneous TID WC  . levothyroxine  75 mcg Oral Q0600  . loratadine  10 mg Oral Daily  . senna  1 tablet Oral BID  . tamsulosin  0.4 mg Oral QPC breakfast   Continuous Infusions: . methocarbamol (ROBAXIN) IV     PRN Meds:.acetaminophen, alum & mag hydroxide-simeth, bisacodyl, HYDROcodone-acetaminophen, HYDROcodone-acetaminophen, magnesium citrate, methocarbamol **OR** methocarbamol (ROBAXIN) IV, morphine injection, ondansetron **OR** ondansetron (ZOFRAN) IV, polyethylene glycol  Micro Results Recent Results (from the past 240 hour(s))  SARS CORONAVIRUS 2 (TAT 6-24 HRS) Nasopharyngeal Nasopharyngeal Swab     Status: None   Collection Time: 12/27/19  5:19 PM   Specimen: Nasopharyngeal Swab  Result Value Ref Range Status   SARS Coronavirus 2 NEGATIVE NEGATIVE Final    Comment: (NOTE) SARS-CoV-2 target nucleic acids are NOT DETECTED. The SARS-CoV-2 RNA is generally detectable in upper and lower respiratory specimens during the acute phase of infection. Negative results do not preclude SARS-CoV-2 infection, do not rule out co-infections with other pathogens, and should not be  used as the sole basis for treatment or other patient management decisions. Negative results must be combined with clinical observations, patient history, and epidemiological information. The expected result is Negative. Fact Sheet for Patients: SugarRoll.be Fact Sheet for Healthcare Providers: https://www.woods-mathews.com/ This test is not yet approved or cleared by the [place] FDA and  has been authorized for detection and/or diagnosis of SARS-CoV-2 by FDA under an Emergency Use Authorization (EUA). This EUA will remain  in effect (meaning this test can be used) for the duration of the COVID-19 declaration under Section 56 4(b)(1) of the Act, 21 U.S.C. section 360bbb-3(b)(1), unless the authorization is terminated or revoked sooner. Performed at [facility], [place] [address] [place], [place] [address]   SARS Coronavirus 2 by RT PCR (hospital order, performed in [place] hospital lab)     Status: None   Collection Time: 12/27/19 10:17 PM  Result Value Ref Range Status   SARS Coronavirus 2 NEGATIVE NEGATIVE Final    Comment: (NOTE) SARS-CoV-2 target nucleic acids are NOT DETECTED. The SARS-CoV-2 RNA is generally detectable in upper and lower respiratory specimens during the acute phase of infection. The lowest concentration of SARS-CoV-2 viral copies this assay can detect is 250 copies / mL. A negative result does not preclude SARS-CoV-2 infection and should not be used as the sole basis for treatment or other patient management decisions.  A negative result may occur with improper specimen collection / handling, submission of specimen other than nasopharyngeal swab, presence of viral mutation(s) within the areas targeted by this assay, and inadequate number of viral copies (<250 copies / mL). A negative result must be combined with clinical observations, patient history, and epidemiological information. Fact Sheet for Patients:   StrictlyIdeas.no Fact Sheet for Healthcare Providers: BankingDealers.co.za This test is not yet approved or cleared  by the [place] FDA and has been authorized for detection and/or diagnosis of SARS-CoV-2 by FDA under an Emergency Use Authorization (EUA).  This EUA will remain in effect (meaning this test can be used) for the duration of the COVID-19 declaration under Section 564(b)(1) of the Act, 21 U.S.C. section 360bbb-3(b)(1), unless the authorization is terminated or revoked sooner. Performed at [facility], [address] [place], [place] [address]     Radiology Reports CT ANGIO HEAD W OR WO CONTRAST  Result Date: 12/31/2019 CLINICAL DATA:  Stroke, follow-up EXAM: CT ANGIOGRAPHY HEAD AND NECK TECHNIQUE: Multidetector CT imaging  of the head and neck was performed using the standard protocol during bolus administration of intravenous contrast. Multiplanar CT image reconstructions and MIPs were obtained to evaluate the vascular anatomy. Carotid stenosis measurements (when applicable) are obtained utilizing NASCET criteria, using the distal internal carotid diameter as the denominator. CONTRAST:  74mL OMNIPAQUE IOHEXOL 350 MG/ML SOLN COMPARISON:  12/17/2019 head CT, 12/29/2019 MRI FINDINGS: CT HEAD FINDINGS Brain: There is no acute intracranial hemorrhage. Small chronic cerebellar infarcts identified. More recent small right cerebellar infarct is better seen on MRI. Gray-white differentiation is preserved. Ventricles are stable in size. No extra-axial collection. Vascular: There is mild intracranial atherosclerotic calcification at the skull base. Skull: Unremarkable. Sinuses: Mild mucosal thickening. Orbits: Orbits are unremarkable. Review of the MIP images confirms the above findings CTA NECK FINDINGS Aortic arch: Mild calcified plaque. Great vessel origins are patent. Right carotid system: Patent. Mild calcified plaque at the ICA origin without measurable stenosis. Left carotid system: Patent. No measurable stenosis at the ICA origin. Vertebral arteries: Patent and codominant. Calcified plaque is  present at the left vertebral artery origin with mild stenosis. Skeleton: Multilevel degenerative changes of the cervical spine. There are bulky anterior bridging osteophytes with indentation of the posterior pharynx and esophagus. Other neck: No neck mass or adenopathy. Upper chest: No apical lung mass. Review of the MIP images confirms the above findings CTA HEAD FINDINGS Anterior circulation: Intracranial internal carotid arteries patent with calcified plaque causing mild stenosis. Anterior and middle cerebral arteries are patent. Posterior circulation: Intracranial vertebral arteries are patent. Posterior inferior cerebellar arteries are  patent. Basilar artery is patent. Superior cerebellar arteries are patent. Posterior cerebral arteries are patent. Venous sinuses: As permitted by contrast timing, patent. Review of the MIP images confirms the above findings IMPRESSION: No acute intracranial hemorrhage or new loss of gray-white differentiation. Recent small cerebellar infarct is better seen on MRI. No hemodynamically significant stenosis or evidence of dissection. Electronically Signed   By: Macy Mis M.D.   On: 12/31/2019 13:31   DG Elbow Complete Right  Result Date: 12/27/2019 CLINICAL DATA:  Recent fall with right elbow pain, initial encounter EXAM: RIGHT ELBOW - COMPLETE 3+ VIEW COMPARISON:  None. FINDINGS: Considerable degenerative changes of the elbow joint are noted particularly in the articulation of the humerus and ulna. Soft tissue swelling is noted posteriorly consistent with the recent injury. No joint effusion is seen. Large olecranon spurs are seen as well as some findings suggestive of olecranon bursitis. IMPRESSION: No acute fracture is noted. Soft tissue changes are seen consistent with the given clinical history. Electronically Signed   By: Inez Catalina M.D.   On: 12/27/2019 16:16   CT Head Wo Contrast  Result Date: 12/17/2019 CLINICAL DATA:  Fall. EXAM: CT HEAD WITHOUT CONTRAST TECHNIQUE: Contiguous axial images were obtained from the base of the skull through the vertex without intravenous contrast. COMPARISON:  None. FINDINGS: Brain: No evidence of acute infarction, hemorrhage, hydrocephalus, extra-axial collection or mass lesion/mass effect. Small remote bilateral cerebellar infarcts. Mild brain atrophy. Vascular: Atherosclerotic calcification Skull: Right forehead laceration.  No calvarial fracture. Sinuses/Orbits: No evidence of injury IMPRESSION: 1. No evidence of intracranial injury. 2. Right forehead laceration without fracture. Electronically Signed   By: Monte Fantasia M.D.   On: 12/17/2019 04:30   CT  ANGIO NECK W OR WO CONTRAST  Result Date: 12/31/2019 CLINICAL DATA:  Stroke, follow-up EXAM: CT ANGIOGRAPHY HEAD AND NECK TECHNIQUE: Multidetector CT imaging of the head and neck was performed using the standard protocol during bolus administration of intravenous contrast. Multiplanar CT image reconstructions and MIPs were obtained to evaluate the vascular anatomy. Carotid stenosis measurements (when applicable) are obtained utilizing NASCET criteria, using the distal internal carotid diameter as the denominator. CONTRAST:  38mL OMNIPAQUE IOHEXOL 350 MG/ML SOLN COMPARISON:  12/17/2019 head CT, 12/29/2019 MRI FINDINGS: CT HEAD FINDINGS Brain: There is no acute intracranial hemorrhage. Small chronic cerebellar infarcts identified. More recent small right cerebellar infarct is better seen on MRI. Gray-white differentiation is preserved. Ventricles are stable in size. No extra-axial collection. Vascular: There is mild intracranial atherosclerotic calcification at the skull base. Skull: Unremarkable. Sinuses: Mild mucosal thickening. Orbits: Orbits are unremarkable. Review of the MIP images confirms the above findings CTA NECK FINDINGS Aortic arch: Mild calcified plaque. Great vessel origins are patent. Right carotid system: Patent. Mild calcified plaque at the ICA origin without measurable stenosis. Left carotid system: Patent. No measurable stenosis at the ICA origin. Vertebral arteries: Patent and codominant. Calcified plaque is present at the left vertebral artery origin with mild stenosis. Skeleton:  Multilevel degenerative changes of the cervical spine. There are bulky anterior bridging osteophytes with indentation of the posterior pharynx and esophagus. Other neck: No neck mass or adenopathy. Upper chest: No apical lung mass. Review of the MIP images confirms the above findings CTA HEAD FINDINGS Anterior circulation: Intracranial internal carotid arteries patent with calcified plaque causing mild stenosis.  Anterior and middle cerebral arteries are patent. Posterior circulation: Intracranial vertebral arteries are patent. Posterior inferior cerebellar arteries are patent. Basilar artery is patent. Superior cerebellar arteries are patent. Posterior cerebral arteries are patent. Venous sinuses: As permitted by contrast timing, patent. Review of the MIP images confirms the above findings IMPRESSION: No acute intracranial hemorrhage or new loss of gray-white differentiation. Recent small cerebellar infarct is better seen on MRI. No hemodynamically significant stenosis or evidence of dissection. Electronically Signed   By: Macy Mis M.D.   On: 12/31/2019 13:31   CT Cervical Spine Wo Contrast  Addendum Date: 12/17/2019   ADDENDUM REPORT: 12/17/2019 09:36 ADDENDUM: Correction to impression #2, the second sentence should read in the upper CERVICAL levels there is also posterior longitudinal ligament ossification. Electronically Signed   By: Monte Fantasia M.D.   On: 12/17/2019 09:36   Result Date: 12/17/2019 CLINICAL DATA:  Fall with laceration. EXAM: CT CERVICAL SPINE WITHOUT CONTRAST TECHNIQUE: Multidetector CT imaging of the cervical spine was performed without intravenous contrast. Multiplanar CT image reconstructions were also generated. COMPARISON:  None. FINDINGS: Alignment: Normal Skull base and vertebrae: Negative for fracture. 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 There is ankylosis from the occiput to T2. 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 bone island in the C2 body. Soft tissues and spinal canal: No prevertebral fluid or swelling. No visible canal hematoma. Lipoma in the posterior left para median neck the even more simple internal architecture than adjacent fat, up to 3 cm on axial slices. Disc levels:  Spondylitic changes as noted above. Upper chest: Negative IMPRESSION: 1. No acute finding. 2. 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 In the upper thoracic levels there is also posterior longitudinal ligament ossification. Electronically Signed: By: Monte Fantasia M.D. On: 12/17/2019 09:04   MR BRAIN WO CONTRAST  Result Date: 12/29/2019 CLINICAL DATA:  Acute neuro deficit, stroke suspected. Left leg symptoms. Recent fall sustaining a right femoral neck fracture treated with arthroplasty. EXAM: MRI HEAD WITHOUT CONTRAST TECHNIQUE: Multiplanar, multiecho pulse sequences of the brain and surrounding structures were obtained without intravenous contrast. COMPARISON:  Head CT 12/17/2019 FINDINGS: Brain: There is a 6 mm acute right cerebellar infarct. Small chronic infarcts are present in the cerebellum bilaterally. No intracranial hemorrhage, mass, midline shift, or extra-axial fluid collection is identified. There is moderate cerebral atrophy. A few small foci of T2 hyperintensity in the cerebral white matter are nonspecific and not considered abnormal for age. Vascular: Major intracranial vascular flow voids are preserved. Skull and upper cervical spine: Unremarkable bone marrow signal. Prominent ligamentous thickening posterior to the dens without significant mass effect on the cervicomedullary junction. Sinuses/Orbits: Unremarkable orbits. Small right maxillary sinus mucous retention cyst. Mild scattered mucosal thickening in the paranasal sinuses, greatest in the left sphenoid sinus. Clear mastoid air cells. Other: None. IMPRESSION: 1. Small acute right cerebellar infarct. 2. Chronic bilateral cerebellar infarcts. Electronically Signed   By: Logan Bores M.D.   On: 12/29/2019 11:48   CT ABDOMEN PELVIS W CONTRAST  Result Date: 12/28/2019 CLINICAL DATA:  Abdominal  trauma with gross hematuria. EXAM: CT ABDOMEN AND PELVIS WITH CONTRAST TECHNIQUE: Multidetector CT imaging of the abdomen and pelvis was performed using the standard protocol following bolus administration of  intravenous contrast. CONTRAST:  10mL OMNIPAQUE IOHEXOL 300 MG/ML  SOLN COMPARISON:  None. FINDINGS: LOWER CHEST: No basilar pleural or apical pericardial effusion. HEPATOBILIARY: Normal hepatic contours. No intra- or extrahepatic biliary dilatation. Status post cholecystectomy. PANCREAS: Normal pancreas. No ductal dilatation or peripancreatic fluid collection. SPLEEN: Normal. ADRENALS/URINARY TRACT: The adrenal glands are normal. There is right-sided retroperitoneal stranding tracking from the right flank along the course of the right ureter, most evident at the level of the right common iliac bifurcation. The left ureter is normal. There is calcification and scarring of the left kidney upper pole. The urinary bladder is normal for degree of distention. There is a Foley catheter with the balloon inflated within the proximal penile urethra. STOMACH/BOWEL: There is no hiatal hernia. Normal duodenal course and caliber. No small bowel dilatation or inflammation. No focal colonic abnormality. Surgically absent. VASCULAR/LYMPHATIC: There is calcific atherosclerosis of the abdominal aorta. No abdominal or pelvic lymphadenopathy. REPRODUCTIVE: Markedly enlarged prostate measures 6.9 x 5.9 x 6.0 cm. MUSCULOSKELETAL. There is a comminuted fracture of the right femoral neck. There are bridging osteophytes of both sacroiliac joints. There are anterior lumbar vertebral enthesophytes and lower thoracic spinous process enthesophytes. There is moderate-to-severe foraminal stenosis bilaterally at L4-5 and L5-S1. OTHER: None. IMPRESSION: 1. Right-sided retroperitoneal stranding tracking from the right flank along the course of the right ureter, most evident at the level of the right common iliac bifurcation. Given the recent fall, this might be a sequela of trauma. Ascending urinary tract infection might also cause this appearance. 2. Foley catheter balloon is inflated within the proximal penile urethra. Removal is recommended. 3.  Comminuted fracture of the right femoral neck. 4. Markedly enlarged prostate. 5. Aortic Atherosclerosis (ICD10-I70.0). Electronically Signed   By: Ulyses Jarred M.D.   On: 12/28/2019 03:42   US RENAL  Result Date: 12/30/2019 CLINICAL DATA:  Hematuria EXAM: RENAL / URINARY TRACT ULTRASOUND COMPLETE COMPARISON:  None. FINDINGS: Right Kidney: Renal measurements: 12 x 4.2 x 5.0 cm = volume: 134 mL. Mildly echogenic cortex. Left Kidney: Renal measurements: 10.9 x 4.5 x 4.9 cm = volume: 128 mL. Limited evaluation. Increased cortical echogenicity in a mildly nodular contour. Bladder: Decompressed with a Foley catheter. Other: None. IMPRESSION: 1. Mildly echogenic cortex bilaterally suggesting the possibility medical renal disease. Evaluation of the left kidney is limited. 2. The bladder is decompressed with a Foley catheter. Electronically Signed   By: Dorise Bullion III M.D   On: 12/30/2019 12:19   US Carotid Bilateral (at [facility] and [facility] only)  Result Date: 12/29/2019 CLINICAL DATA:  Acute cerebellar infarct EXAM: BILATERAL CAROTID DUPLEX ULTRASOUND TECHNIQUE: Pearline Cables scale imaging, color Doppler and duplex ultrasound were performed of bilateral carotid and vertebral arteries in the neck. COMPARISON:  12/29/2019 FINDINGS: Criteria: Quantification of carotid stenosis is based on velocity parameters that correlate the residual internal carotid diameter with NASCET-based stenosis levels, using the diameter of the distal internal carotid lumen as the denominator for stenosis measurement. The following velocity measurements were obtained: RIGHT ICA: 101/15 cm/sec CCA: 0000000 cm/sec SYSTOLIC ICA/CCA RATIO:  1.3 ECA: 133 cm/sec LEFT ICA: 98/17 cm/sec CCA: 123456 cm/sec SYSTOLIC ICA/CCA RATIO:  1.0 ECA: 127 cm/sec RIGHT CAROTID ARTERY: Minor echogenic shadowing plaque formation. No hemodynamically significant right ICA stenosis, velocity elevation, or turbulent flow. Degree of narrowing less than 50%. RIGHT  VERTEBRAL ARTERY:   Antegrade LEFT CAROTID ARTERY: Similar scattered minor echogenic plaque formation. No hemodynamically significant left ICA stenosis, velocity elevation, or turbulent flow. LEFT VERTEBRAL ARTERY:  Antegrade IMPRESSION: Minor carotid atherosclerosis. No hemodynamically significant ICA stenosis. Degree of narrowing less than 50% bilaterally by ultrasound criteria. Patent antegrade vertebral flow bilaterally Electronically Signed   By: Jerilynn Mages.  Shick M.D.   On: 12/29/2019 13:55   DG Chest Portable 1 View  Result Date: 12/27/2019 CLINICAL DATA:  Right hip pain, hypertension EXAM: PORTABLE CHEST 1 VIEW COMPARISON:  None. FINDINGS: Heart size is mildly enlarged. Aortic atherosclerosis. Both lungs are clear. The visualized skeletal structures are unremarkable. IMPRESSION: 1. No active cardiopulmonary disease. 2. Mild cardiomegaly. 3. Aortic atherosclerosis. Electronically Signed   By: Davina Poke D.O.   On: 12/27/2019 17:44   DG Knee Left Port  Result Date: 12/28/2019 CLINICAL DATA:  Left knee pain. EXAM: PORTABLE LEFT KNEE - 1-2 VIEW COMPARISON:  None. FINDINGS: No evidence of fracture, dislocation, or joint effusion. Advanced degenerative joint disease identified with marked medial compartment narrowing, subchondral sclerosis and marginal spur formation. No fractures or dislocations. IMPRESSION: 1. Advanced degenerative joint disease. 2. No acute findings. Electronically Signed   By: Kerby Moors M.D.   On: 12/28/2019 11:28   DG Knee Right Port  Result Date: 12/28/2019 CLINICAL DATA:  Right knee pain. EXAM: PORTABLE RIGHT KNEE - 1-2 VIEW COMPARISON:  None. FINDINGS: No acute fracture is identified on this single AP radiograph. There is severe medial compartment joint space narrowing with mild genu varus deformity. Moderate medial and lateral compartment marginal osteophytosis is noted. There is mild diffuse soft tissue swelling. IMPRESSION: 1. Severe medial compartment osteoarthrosis. 2. No acute osseous  abnormality identified. Electronically Signed   By: Logan Bores M.D.   On: 12/28/2019 11:42   ECHOCARDIOGRAM COMPLETE  Result Date: 12/30/2019   ECHOCARDIOGRAM REPORT   Patient Name:   IDIRIS CAPANO Date of Exam: 12/29/2019 Medical Rec #:  [id number]        Height:       74.0 in Accession #:    [id number]       Weight:       234.0 lb Date of Birth:  1941/01/10        BSA:          2.32 m Patient Age:    36 years         BP:           135/56 mmHg Patient Gender: M                HR:           76 bpm. Exam Location:  [facility] Procedure: 2D Echo Indications:     STROKE 434.91/ I163.9  History:         Patient has no prior history of Echocardiogram examinations.  Sonographer:     Arville Go RDCS Referring Phys:  [place] Diagnosing Phys: Bartholome Bill MD IMPRESSIONS  1. Left ventricular ejection fraction, by visual estimation, is 55 to 60%. The left ventricle has normal function. Left ventricular septal wall thickness was mildly increased. There is borderline left ventricular hypertrophy.  2. The left ventricle has no regional wall motion abnormalities.  3. Global right ventricle has normal systolic function.The right ventricular size is normal. No increase in right ventricular wall thickness.  4. Left atrial size was normal.  5. Right atrial size was normal.  6. The mitral valve is grossly  normal. Trivial mitral valve regurgitation.  7. The tricuspid valve is grossly normal.  8. The aortic valve is tricuspid. Aortic valve regurgitation is not visualized.  9. The pulmonic valve was grossly normal. Pulmonic valve regurgitation is not visualized. 10. The atrial septum is grossly normal. FINDINGS  Left Ventricle: Left ventricular ejection fraction, by visual estimation, is 55 to 60%. The left ventricle has normal function. The left ventricle has no regional wall motion abnormalities. There is borderline left ventricular hypertrophy. Right Ventricle: The right ventricular size is normal. No increase in  right ventricular wall thickness. Global RV systolic function is has normal systolic function. Left Atrium: Left atrial size was normal in size. Right Atrium: Right atrial size was normal in size Pericardium: There is no evidence of pericardial effusion. Mitral Valve: The mitral valve is grossly normal. Trivial mitral valve regurgitation. Tricuspid Valve: The tricuspid valve is grossly normal. Tricuspid valve regurgitation is mild. Aortic Valve: The aortic valve is tricuspid. Aortic valve regurgitation is not visualized. Aortic valve peak gradient measures 8.6 mmHg. Pulmonic Valve: The pulmonic valve was grossly normal. Pulmonic valve regurgitation is not visualized. Pulmonic regurgitation is not visualized. Aorta: The aortic root is normal in size and structure. IAS/Shunts: The atrial septum is grossly normal.  LEFT VENTRICLE PLAX 2D LVIDd:         4.29 cm  Diastology LVIDs:         3.05 cm  LV e' lateral:   7.29 cm/s LV PW:         1.23 cm  LV E/e' lateral: 9.5 LV IVS:        1.21 cm  LV e' medial:    7.51 cm/s LVOT diam:     2.30 cm  LV E/e' medial:  9.2 LV SV:         46 ml LV SV Index:   19.43 LVOT Area:     4.15 cm  RIGHT VENTRICLE RV Basal diam:  3.09 cm RV S prime:     11.40 cm/s TAPSE (M-mode): 2.1 cm LEFT ATRIUM           Index      RIGHT ATRIUM           Index LA diam:      2.30 cm 0.99 cm/m RA Area:     15.70 cm LA Vol (A2C): 18.9 ml 8.13 ml/m RA Volume:   41.10 ml  17.69 ml/m LA Vol (A4C): 12.0 ml 5.16 ml/m  AORTIC VALVE                PULMONIC VALVE AV Area (Vmax): 2.46 cm    PV Vmax:       1.03 m/s AV Vmax:        147.00 cm/s PV Peak grad:  4.2 mmHg AV Peak Grad:   8.6 mmHg LVOT Vmax:      87.20 cm/s LVOT Vmean:     57.100 cm/s LVOT VTI:       0.166 m  AORTA Ao Root diam: 3.70 cm Ao Asc diam:  3.30 cm MV E velocity: 69.40 cm/s 103 cm/s  TRICUSPID VALVE MV A velocity: 70.30 cm/s 70.3 cm/s TV Peak grad:   36.2 mmHg MV E/A ratio:  0.99       1.5       TV Vmax:        3.01 m/s  SHUNTS                                     Systemic VTI:  0.17 m                                     Systemic Diam: 2.30 cm  Bartholome Bill MD Electronically signed by Bartholome Bill MD Signature Date/Time: 12/30/2019/8:26:01 AM    Final    DG Hip Port Unilat With Pelvis 1V Right  Result Date: 12/28/2019 CLINICAL DATA:  80 year old male with a history of total hip replacement EXAM: DG HIP (WITH OR WITHOUT PELVIS) 1V PORT RIGHT COMPARISON:  12/27/2019 FINDINGS: Interval surgical changes of right hip arthroplasty. Gas and swelling at the surgical bed. Surgical staples project within the soft tissues. Urinary catheter in position. Left hip with degenerative changes.  Bony pelvic ring intact. IMPRESSION: Early surgical changes of right hip arthroplasty without complicating features. Urinary catheter. Electronically Signed   By: Corrie Mckusick D.O.   On: 12/28/2019 11:30   DG Hip Unilat W or Wo Pelvis 2-3 Views Right  Result Date: 12/27/2019 CLINICAL DATA:  Fall. EXAM: DG HIP (WITH OR WITHOUT PELVIS) 2-3V RIGHT COMPARISON:  No recent. FINDINGS: Diffuse severe osteopenia and degenerative change. Angulated fracture of the right femoral neck is noted. No evidence of dislocation. IMPRESSION: 1.  Angulated right femoral neck fracture. 2.  Diffuse severe osteopenia degenerative change. Electronically Signed   By: Marcello Moores  Register   On: 12/27/2019 16:16    Time Spent in minutes 35   Jessy Calixte M.D on 12/31/2019 at 2:23 PM  Between 7am to 7pm - Pager - [phone]  After 7pm go to [email or web address] - password [facility]  [facility] -  Office  [phone]

## 2019-12-31 NOTE — Progress Notes (Signed)
Urology Consult Follow Up  Subjective: 79 year old male seen last Thursday for malpositioned Foley with prostatic trauma (Foley balloon blown up and prostate).  Foley catheter was replaced by partner, Dr. Diamantina Providence, [address].  Since then, the patient's had continued hematuria as well as anemia requiring blood transfusion x1.  Asked to see the patient again today time for ongoing hematuria.  Antiplatelet therapy is being held however the patient had an acute CVA and ideally should be on these medications.       Anti-infectives: Anti-infectives (From admission, onward)   Start     Dose/Rate Route Frequency Ordered Stop   12/28/19 1400  ceFAZolin (ANCEF) IVPB 1 g/50 mL premix     1 g 100 mL/hr over 30 Minutes Intravenous Every 6 hours 12/28/19 1151 12/29/19 1900   12/28/19 0747  ceFAZolin (ANCEF) 2-4 GM/100ML-% IVPB    Note to Pharmacy: Norton Blizzard  : cabinet override      12/28/19 0747 12/28/19 0827   12/27/19 2019  ceFAZolin (ANCEF) IVPB 2g/100 mL premix     2 g 200 mL/hr over 30 Minutes Intravenous 30 min pre-op 12/27/19 2019 12/28/19 0835      Current Facility-Administered Medications  Medication Dose Route Frequency Provider Last Rate Last Admin  .  stroke: mapping our early stages of recovery book   Does not apply Once Dhungel, Nishant, MD      . acetaminophen (TYLENOL) tablet 325-650 mg  325-650 mg Oral Q6H PRN Thornton Park, MD      . alum & mag hydroxide-simeth (MAALOX/MYLANTA) 200-200-20 MG/5ML suspension 30 mL  30 mL Oral Q4H PRN Thornton Park, MD      . amLODipine (NORVASC) tablet 10 mg  10 mg Oral Daily Loletha Grayer, MD   10 mg at 12/31/19 [id number]   And  . benazepril (LOTENSIN) tablet 40 mg  40 mg Oral Daily Loletha Grayer, MD   40 mg at 12/31/19 0907  . aspirin EC tablet 325 mg  325 mg Oral BID Thornton Park, MD   325 mg at 12/31/19 0908  . atorvastatin (LIPITOR) tablet 40 mg  40 mg Oral q1800 Thornton Park, MD   40 mg at 12/30/19 1758  .  bisacodyl (DULCOLAX) suppository 10 mg  10 mg Rectal Daily PRN Thornton Park, MD      . bisoprolol-hydrochlorothiazide [facility]) 10-6.25 MG per tablet 1 tablet  1 tablet Oral Daily Thornton Park, MD   1 tablet at 12/31/19 0907  . Chlorhexidine Gluconate Cloth 2 % PADS 6 each  6 each Topical Daily Loletha Grayer, MD   6 each at 12/30/19 1318  . clopidogrel (PLAVIX) tablet 75 mg  75 mg Oral Daily Thornton Park, MD   75 mg at 12/31/19 0907  . docusate sodium (COLACE) capsule 100 mg  100 mg Oral BID Thornton Park, MD   100 mg at 12/31/19 0907  . fluticasone (FLONASE) 50 MCG/ACT nasal spray 1 spray  1 spray Each Nare Daily Thornton Park, MD   1 spray at 12/31/19 0908  . HYDROcodone-acetaminophen (NORCO) 7.5-325 MG per tablet 1-2 tablet  1-2 tablet Oral Q4H PRN Thornton Park, MD      . HYDROcodone-acetaminophen (NORCO/VICODIN) 5-325 MG per tablet 1-2 tablet  1-2 tablet Oral Q4H PRN Thornton Park, MD      . insulin aspart (novoLOG) injection 0-5 Units  0-5 Units Subcutaneous QHS Thornton Park, MD      . insulin aspart (novoLOG) injection 0-9 Units  0-9 Units Subcutaneous TID WC Thornton Park,  MD   3 Units at 12/31/19 1234  . levothyroxine (SYNTHROID) tablet 75 mcg  75 mcg Oral Q0600 Thornton Park, MD   75 mcg at 12/31/19 0550  . loratadine (CLARITIN) tablet 10 mg  10 mg Oral Daily Thornton Park, MD   10 mg at 12/31/19 0907  . magnesium citrate solution 1 Bottle  1 Bottle Oral Once PRN Thornton Park, MD      . methocarbamol (ROBAXIN) tablet 500 mg  500 mg Oral Q6H PRN Thornton Park, MD       Or  . methocarbamol (ROBAXIN) 500 mg in dextrose 5 % 50 mL IVPB  500 mg Intravenous Q6H PRN Thornton Park, MD      . morphine 2 MG/ML injection 0.5-1 mg  0.5-1 mg Intravenous Q2H PRN Thornton Park, MD      . ondansetron [facility]) tablet 4 mg  4 mg Oral Q6H PRN Thornton Park, MD       Or  . ondansetron [facility]) injection 4 mg  4 mg Intravenous Q6H PRN Thornton Park, MD       . polyethylene glycol (MIRALAX / GLYCOLAX) packet 17 g  17 g Oral Daily PRN Thornton Park, MD      . senna (SENOKOT) tablet 8.6 mg  1 tablet Oral BID Thornton Park, MD   8.6 mg at 12/31/19 0907  . tamsulosin (FLOMAX) capsule 0.4 mg  0.4 mg Oral QPC breakfast Thornton Park, MD   0.4 mg at 12/31/19 [id number]     Objective: Vital signs in last 24 hours: Temp:  [97.5 F (36.4 C)-98.6 F (37 C)] 97.7 F (36.5 C) (01/11 0856) Pulse Rate:  [67-81] 70 (01/11 0856) Resp:  [16-18] 18 (01/11 0856) BP: (135-158)/(60-81) 146/67 (01/11 0856) SpO2:  [94 %-99 %] 94 % (01/11 1325)  Intake/Output from previous day: 01/10 0701 - 01/11 0700 In: 360 [Blood:360] Out: 1600 [Urine:1600] Intake/Output this shift: Total I/O In: 360 [P.O.:360] Out: -    Physical Exam  Alert and oriented, follows commands and answers questions appropriately Abdomen soft nontender Foley catheter in place draining cranberry colored urine.  Patient was then irrigated using standard sterile technique at which time a very small clot was evacuated and that point in time, the patient had bladder spasm Additional 500 cc of urine was drained from the bladder.  It was like cranberry in color.  The patient was then hand irrigated approximately 400 cc of sterile water at which time only a small amount of old clot was evacuated.  The urine cleared nicely.  Is light pink at the end of irrigation resecured to the patient's left thigh.  Lab Results:  Recent Labs    12/30/19 0341 12/31/19 0502  WBC 10.9* 11.7*  HGB 7.7* 8.7*  HCT 23.9* 26.3*  PLT 132* 146*   BMET Recent Labs    12/30/19 0341 12/31/19 0502  NA 135 131*  K 4.8 4.2  CL 100 98  CO2 24 23  GLUCOSE 148* 140*  BUN 30* 29*  CREATININE 1.37* 1.08  CALCIUM 9.5 9.6   CLINICAL DATA:  Hematuria  EXAM: RENAL / URINARY TRACT ULTRASOUND COMPLETE  COMPARISON:  None.  FINDINGS: Right Kidney:  Renal measurements: 12 x 4.2 x 5.0 cm = volume: 134 mL.  Mildly echogenic cortex.  Left Kidney:  Renal measurements: 10.9 x 4.5 x 4.9 cm = volume: 128 mL. Limited evaluation. Increased cortical echogenicity in a mildly nodular contour.  Bladder:  Decompressed with a Foley catheter.  Other:  None.  IMPRESSION: 1. Mildly echogenic cortex bilaterally suggesting the possibility medical renal disease. Evaluation of the left kidney is limited. 2. The bladder is decompressed with a Foley catheter.   Electronically Signed   By: Dorise Bullion III M.D   On: 12/30/2019 12:19   Assessment: 79 year old male with traumatic Foley with ongoing gross hematuria.  Irrigated at bedside today at which time his catheter is found to be obstructed, clear of a small amount of clot burden which cleared fairly nicely.  Plan: -At this point, Foley catheter may be continue to irritate prostate perpetuating ongoing bleeding -Blood primarily appears to be older and not significant -Would recommend irrigating catheter overnight as needed and early morning voiding trial -Urology will be available tomorrow afternoon to replace Foley catheter if deemed necessary    LOS: 4 days    Hollice Espy 12/31/2019

## 2019-12-31 NOTE — Progress Notes (Signed)
Occupational Therapy Treatment Patient Details Name: Grant Mcdaniel MRN: [id number] DOB: 08/11/1941 Today's Date: 12/31/2019    History of present illness Patient is a 79 year old male who was admitted to [facility] for a R hip hemiarthroplasty secondary to a right femoral neck fracture. Pt. had a cerbellar infarct. Pt. PMHx includes: CAD, CKD, DM with CKD stage III, Hypercholesterolemia, HTN, hypothyroid, PVD, and falls.   OT comments  Pt. with a new onset of  CVA following hip surgery. 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 Pt. required assist with  Meal set-up including cutting sausage, and opening containers. Pt. education was provided posterior hip precautions, and A/E was reviewed with the pt.  Pt. will need additional training with the A/E. Pt. could benefit from OT services for ADL training, continued A/E training, and pt. education about posterior hip precautions, home modification, and DME. Pt. discharge disposition continues to be most appropriate for SNF with follow-up OT services in order to continue to maximize independence, and assist pt. in facilitating return to his PLOF.    Follow Up Recommendations  SNF    Equipment Recommendations  3 in 1 bedside commode    Recommendations for Other Services      Precautions / Restrictions Precautions Precautions: Posterior Hip Precaution Comments: WBAT Restrictions Weight Bearing Restrictions: Yes RLE Weight Bearing: Weight bearing as tolerated Other Position/Activity Restrictions: New CVA following Hip surgery       Mobility Bed Mobility Overal bed mobility: Needs Assistance Bed Mobility: Supine to Sit;Sit to Supine     Supine to sit: Mod assist Sit to supine: Mod assist;Max assist   General bed mobility comments: Heavy leaning to the left in sitting  Transfers   Equipment used: 1 person hand held assist   Sit to Stand: Max assist         General transfer comment: Mobility per PT report     Balance                                           ADL either performed or assessed with clinical judgement   ADL Overall ADL's : Needs assistance/impaired Eating/Feeding: Set up;Sitting;Independent;Bed level Eating/Feeding Details (indicate cue type and reason): Assist cutting sausage, and opening containers Grooming: Minimal assistance;Set up   Upper Body Bathing: Set up;Minimal assistance   Lower Body Bathing: Maximal assistance;Bed level   Upper Body Dressing : Maximal assistance;Bed level;Minimal assistance   Lower Body Dressing: Maximal assistance                 General ADL Comments: Pt. education was provided about A/E use for LE ADLs.     Vision Patient Visual Report: No change from baseline     Perception     Praxis      Cognition Arousal/Alertness: Awake/alert Behavior During Therapy: WFL for tasks assessed/performed Overall Cognitive Status: Within Functional Limits for tasks assessed                                 General Comments: Patient is able to follow simple commands but require re-direct and task orientation with multistep.        Exercises     Shoulder Instructions       General Comments      Pertinent Vitals/ Pain  Pain Assessment: 0-10 Pain Score: 2  Faces Pain Scale: Hurts a little bit Pain Location: Right hip Pain Descriptors / Indicators: Aching Pain Intervention(s): Limited activity within patient's tolerance;Monitored during session  Home Living                                          Prior Functioning/Environment              Frequency  Min 2X/week        Progress Toward Goals  OT Goals(current goals can now be found in the care plan section)  Progress towards OT goals: Progressing toward goals  Acute Rehab OT Goals Patient Stated Goal: To regain independence  Plan      Co-evaluation                 AM-PAC OT "6 Clicks" Daily Activity      Outcome Measure   Help from another person eating meals?: A Little Help from another person taking care of personal grooming?: A Little Help from another person toileting, which includes using toliet, bedpan, or urinal?: A Lot Help from another person bathing (including washing, rinsing, drying)?: A Lot Help from another person to put on and taking off regular upper body clothing?: A Little Help from another person to put on and taking off regular lower body clothing?: A Lot 6 Click Score: 15    End of Session    OT Visit Diagnosis: Unsteadiness on feet (R26.81)   Activity Tolerance Patient tolerated treatment well   Patient Left in bed;with call bell/phone within reach;with bed alarm set   Nurse Communication          Time: [id number] OT Time Calculation (min): 15 min  Charges: OT General Charges $OT Visit: 1 Visit OT Treatments $Self Care/Home Management : 8-22 mins   Harrel Carina, MS, OTR/L  Harrel Carina 12/31/2019, 9:41 AM

## 2019-12-31 NOTE — Progress Notes (Signed)
Physical Therapy Treatment Patient Details Name: Grant Mcdaniel MRN: [id number] DOB: March 16, 1941 Today's Date: 12/31/2019    History of Present Illness Patient is a 79 year old male who was admitted to [facility] for a R hip hemiarthroplasty secondary to a right femoral neck fracture. Pt. had a cerbellar infarct. Pt. PMHx includes: CAD, CKD, DM with CKD stage III, Hypercholesterolemia, HTN, hypothyroid, PVD, and falls.    PT Comments    Improved control of L LE this date (able to stabilize and WB with transfers), but does continue to require cuing/assist for placement prior to sit/stand.  Heavy use of UEs to pull on RW/lift self with all transfers; minimal reports of pain in L LE.  Able to complete OOB to chair with RW, mod assist this date-generally broad BOS with inconsistent L LE placement.  Continue to recommend rw and +1 at all times; will continue to progress gait distance in subsequent sessions as appropriate.   Follow Up Recommendations  SNF     Equipment Recommendations  Rolling walker with 5" wheels;3in1 (PT)    Recommendations for Other Services       Precautions / Restrictions Precautions Precautions: Posterior Hip;Fall Precaution Comments: WBAT Restrictions Weight Bearing Restrictions: Yes RLE Weight Bearing: Weight bearing as tolerated Other Position/Activity Restrictions: New CVA following Hip surgery    Mobility  Bed Mobility Overal bed mobility: Needs Assistance Bed Mobility: Supine to Sit     Supine to sit: Mod assist Sit to supine: Mod assist;Max assist   General bed mobility comments: mod assist for LE management, truncal elevation; maintains sitting balance in midline with close sup  Transfers Overall transfer level: Needs assistance Equipment used: Rolling walker (2 wheeled) Transfers: Sit to/from Stand Sit to Stand: Mod assist         General transfer comment: heavy pulling on RW for sit/stand, mod assist for L LE placement (was able to actively  stabilize and WB through L LE this date)  Ambulation/Gait Ambulation/Gait assistance: Mod assist Gait Distance (Feet): 5 Feet(x2) Assistive device: Rolling walker (2 wheeled)       General Gait Details: broad BOS with forward flexed posture, mod WBing bilat UEs; decreased control/inconsistent foot placement  L LE.  Poor balance; unsafe to attempt without RW and +1 at all times.   Stairs             Wheelchair Mobility    Modified Rankin (Stroke Patients Only)       Balance Overall balance assessment: Needs assistance Sitting-balance support: No upper extremity supported;Feet supported Sitting balance-Leahy Scale: Fair     Standing balance support: Bilateral upper extremity supported Standing balance-Leahy Scale: Poor                              Cognition Arousal/Alertness: Awake/alert Behavior During Therapy: WFL for tasks assessed/performed;Flat affect Overall Cognitive Status: Within Functional Limits for tasks assessed                                 General Comments: Patient is able to follow simple commands but require re-direct and task orientation with multistep.      Exercises Other Exercises Other Exercises: Toilet transfer, SPT from bed/BSC, mod assist for sit/stand, standing balancce and overall safety/sequencing; dep for hygiene Other Exercises: Sit/stand from various surfaces heights-bed, BSC-with RW, mod assist; consistently pulls on RW despite cuing.  General Comments        Pertinent Vitals/Pain Pain Assessment: 0-10 Pain Score: 3  Faces Pain Scale: Hurts a little bit Pain Location: Right hip Pain Descriptors / Indicators: Aching Pain Intervention(s): Limited activity within patient's tolerance;Monitored during session;Repositioned    Home Living                      Prior Function            PT Goals (current goals can now be found in the care plan section) Acute Rehab PT Goals Patient Stated  Goal: To regain independence PT Goal Formulation: With patient Time For Goal Achievement: 01/12/20 Potential to Achieve Goals: Fair Progress towards PT goals: Progressing toward goals    Frequency    BID      PT Plan Current plan remains appropriate    Co-evaluation              AM-PAC PT "6 Clicks" Mobility   Outcome Measure  Help needed turning from your back to your side while in a flat bed without using bedrails?: A Lot Help needed moving from lying on your back to sitting on the side of a flat bed without using bedrails?: A Lot Help needed moving to and from a bed to a chair (including a wheelchair)?: A Lot Help needed standing up from a chair using your arms (e.g., wheelchair or bedside chair)?: A Lot Help needed to walk in hospital room?: A Lot Help needed climbing 3-5 steps with a railing? : A Lot 6 Click Score: 12    End of Session Equipment Utilized During Treatment: Gait belt Activity Tolerance: Patient tolerated treatment well Patient left: in chair;with call bell/phone within reach(chair pad not available in room; CNA/RN informed/aware, to monitor patient as needed and apply alarm with next sit/stand) Nurse Communication: Mobility status PT Visit Diagnosis: Unsteadiness on feet (R26.81);Other abnormalities of gait and mobility (R26.89);Muscle weakness (generalized) (M62.81);History of falling (Z91.81);Difficulty in walking, not elsewhere classified (R26.2)     Time: [id number] PT Time Calculation (min) (ACUTE ONLY): 35 min  Charges:  $Therapeutic Activity: 23-37 mins                     Myda Detwiler H. Owens Shark, PT, DPT, NCS 12/31/19, 1:33 PM [phone]

## 2019-12-31 NOTE — Progress Notes (Signed)
Physical Therapy Treatment Patient Details Name: Grant Mcdaniel MRN: [id number] DOB: 1941-11-07 Today's Date: 12/31/2019    History of Present Illness Patient is a 79 year old male who was admitted to [facility] for a R hip hemiarthroplasty secondary to a right femoral neck fracture. Pt. had a cerbellar infarct. Pt. PMHx includes: CAD, CKD, DM with CKD stage III, Hypercholesterolemia, HTN, hypothyroid, PVD, and falls.    PT Comments    Pt in bed, returned from CT and reports general fatigue and fullness from drinking prep.  Has not touched lunch.  While he declines OOB activities, he agrees to ex as described below.  Declines further session and requests to rest.     Follow Up Recommendations  SNF     Equipment Recommendations  Rolling walker with 5" wheels;3in1 (PT)    Recommendations for Other Services       Precautions / Restrictions Precautions Precautions: Posterior Hip;Fall Precaution Comments: WBAT Restrictions Weight Bearing Restrictions: Yes RLE Weight Bearing: Weight bearing as tolerated    Mobility  Bed Mobility Overal bed mobility: Needs Assistance Bed Mobility: Supine to Sit     Supine to sit: Mod assist     General bed mobility comments: mod assist for LE management, truncal elevation; maintains sitting balance in midline with close sup  Transfers Overall transfer level: Needs assistance Equipment used: Rolling walker (2 wheeled) Transfers: Sit to/from Stand Sit to Stand: Mod assist         General transfer comment: heavy pulling on RW for sit/stand, mod assist for L LE placement (was able to actively stabilize and WB through L LE this date)  Ambulation/Gait Ambulation/Gait assistance: Mod assist Gait Distance (Feet): 5 Feet(x2) Assistive device: Rolling walker (2 wheeled)       General Gait Details: broad BOS with forward flexed posture, mod WBing bilat UEs; decreased control/inconsistent foot placement  L LE.  Poor balance; unsafe to attempt  without RW and +1 at all times.   Stairs             Wheelchair Mobility    Modified Rankin (Stroke Patients Only)       Balance Overall balance assessment: Needs assistance Sitting-balance support: No upper extremity supported;Feet supported Sitting balance-Leahy Scale: Fair     Standing balance support: Bilateral upper extremity supported Standing balance-Leahy Scale: Poor                              Cognition Arousal/Alertness: Awake/alert Behavior During Therapy: WFL for tasks assessed/performed;Flat affect Overall Cognitive Status: Within Functional Limits for tasks assessed                                 General Comments: Patient is able to follow simple commands but require re-direct and task orientation with multistep.      Exercises Other Exercises Other Exercises: Supine A/AAROM for BLE ankle pumps, heel slides, ab/add SLR and quad sets x 10 Other Exercises: Sit/stand from various surfaces heights-bed, BSC-with RW, mod assist; consistently pulls on RW despite cuing.    General Comments        Pertinent Vitals/Pain Pain Assessment: Faces Pain Score: 3  Faces Pain Scale: Hurts little more Pain Location: Right hip Pain Descriptors / Indicators: Aching Pain Intervention(s): Limited activity within patient's tolerance;Monitored during session    Home Living  Prior Function            PT Goals (current goals can now be found in the care plan section) Acute Rehab PT Goals Patient Stated Goal: To regain independence PT Goal Formulation: With patient Time For Goal Achievement: 01/12/20 Potential to Achieve Goals: Fair Progress towards PT goals: Progressing toward goals    Frequency    BID      PT Plan Current plan remains appropriate    Co-evaluation              AM-PAC PT "6 Clicks" Mobility   Outcome Measure  Help needed turning from your back to your side while in a  flat bed without using bedrails?: A Lot Help needed moving from lying on your back to sitting on the side of a flat bed without using bedrails?: A Lot Help needed moving to and from a bed to a chair (including a wheelchair)?: A Lot Help needed standing up from a chair using your arms (e.g., wheelchair or bedside chair)?: A Lot Help needed to walk in hospital room?: A Lot Help needed climbing 3-5 steps with a railing? : A Lot 6 Click Score: 12    End of Session Equipment Utilized During Treatment: Gait belt Activity Tolerance: Patient tolerated treatment well;Patient limited by fatigue Patient left: in bed;with call bell/phone within reach;with bed alarm set Nurse Communication: Mobility status PT Visit Diagnosis: Unsteadiness on feet (R26.81);Other abnormalities of gait and mobility (R26.89);Muscle weakness (generalized) (M62.81);History of falling (Z91.81);Difficulty in walking, not elsewhere classified (R26.2)     Time: 1331-1340 PT Time Calculation (min) (ACUTE ONLY): 9 min  Charges:  $Therapeutic Exercise: 8-22 mins $Therapeutic Activity: 23-37 mins                     Chesley Noon, PTA 12/31/19, 2:31 PM

## 2019-12-31 NOTE — Care Management Important Message (Signed)
Important Message  Patient Details  Name: NAHJEE KARSTETTER MRN: [id number] Date of Birth: 05/12/1941   Medicare Important Message Given:  Yes     Juliann Pulse A Twyla Dais 12/31/2019, 12:05 PM

## 2020-01-01 ENCOUNTER — Inpatient Hospital Stay: Payer: Medicare Other

## 2020-01-01 LAB — URINALYSIS, ROUTINE W REFLEX MICROSCOPIC
Bacteria, UA: NONE SEEN
Bilirubin Urine: NEGATIVE
Glucose, UA: NEGATIVE mg/dL
Ketones, ur: NEGATIVE mg/dL
Nitrite: NEGATIVE
Protein, ur: 100 mg/dL — AB
RBC / HPF: >50 RBC/hpf — ABNORMAL HIGH (ref 0–5)
Specific Gravity, Urine: 1.025 (ref 1.005–1.030)
WBC, UA: >50 WBC/hpf — ABNORMAL HIGH (ref 0–5)
pH: 8 (ref 5.0–8.0)

## 2020-01-01 LAB — CBC
HCT: 32.3 % — ABNORMAL LOW (ref 39.0–52.0)
Hemoglobin: 10.5 g/dL — ABNORMAL LOW (ref 13.0–17.0)
MCH: 28.8 pg (ref 26.0–34.0)
MCHC: 32.5 g/dL (ref 30.0–36.0)
MCV: 88.7 fL (ref 80.0–100.0)
Platelets: 152 10*3/uL (ref 150–400)
RBC: 3.64 MIL/uL — ABNORMAL LOW (ref 4.22–5.81)
RDW: 13.9 % (ref 11.5–15.5)
WBC: 3 10*3/uL — ABNORMAL LOW (ref 4.0–10.5)
nRBC: 1.6 % — ABNORMAL HIGH (ref 0.0–0.2)

## 2020-01-01 LAB — BASIC METABOLIC PANEL WITH GFR
Anion gap: 14 (ref 5–15)
BUN: 36 mg/dL — ABNORMAL HIGH (ref 8–23)
CO2: 20 mmol/L — ABNORMAL LOW (ref 22–32)
Calcium: 9.4 mg/dL (ref 8.9–10.3)
Chloride: 99 mmol/L (ref 98–111)
Creatinine, Ser: 1.61 mg/dL — ABNORMAL HIGH (ref 0.61–1.24)
GFR calc Af Amer: 47 mL/min — ABNORMAL LOW
GFR calc non Af Amer: 41 mL/min — ABNORMAL LOW
Glucose, Bld: 172 mg/dL — ABNORMAL HIGH (ref 70–99)
Potassium: 3.8 mmol/L (ref 3.5–5.1)
Sodium: 133 mmol/L — ABNORMAL LOW (ref 135–145)

## 2020-01-01 LAB — GLUCOSE, CAPILLARY
Glucose-Capillary: 147 mg/dL — ABNORMAL HIGH (ref 70–99)
Glucose-Capillary: 154 mg/dL — ABNORMAL HIGH (ref 70–99)
Glucose-Capillary: 121 mg/dL — ABNORMAL HIGH (ref 70–99)
Glucose-Capillary: 158 mg/dL — ABNORMAL HIGH (ref 70–99)
Glucose-Capillary: 163 mg/dL — ABNORMAL HIGH (ref 70–99)

## 2020-01-01 NOTE — Progress Notes (Signed)
Subjective:  POD #4 s/p right hip hemiarthroplasty.   Patient reports right hip pain as mild.    Objective:   VITALS:   Vitals:   12/31/19 1634 12/31/19 2017 01/01/20 0517 01/01/20 0726  BP: 135/70 (!) 159/71 (!) 157/72 (!) 159/63  Pulse: 72 72 70 93  Resp: 18 18 18 18   Temp: 97.6 F (36.4 C) 97.6 F (36.4 C)  97.6 F (36.4 C)  TempSrc: Oral Oral  Oral  SpO2: 98% 98% 97% 95%  Weight:      Height:        PHYSICAL EXAM: Right lower extremity: Patient's bandages been changed.  There is scant drainage on the patient's new bandage. Neurovascular intact Sensation intact distally Intact pulses distally Dorsiflexion/Plantar flexion intact Incision: scant drainage No cellulitis present Compartment soft  LABS  Results for orders placed or performed during the hospital encounter of 12/27/19 (from the past 24 hour(s))  Glucose, capillary     Status: Abnormal   Collection Time: 12/31/19 11:50 AM  Result Value Ref Range   Glucose-Capillary 204 (H) 70 - 99 mg/dL  Glucose, capillary     Status: Abnormal   Collection Time: 12/31/19  4:34 PM  Result Value Ref Range   Glucose-Capillary 126 (H) 70 - 99 mg/dL  Glucose, capillary     Status: Abnormal   Collection Time: 12/31/19  9:04 PM  Result Value Ref Range   Glucose-Capillary 135 (H) 70 - 99 mg/dL   Comment 1 Notify RN   Glucose, capillary     Status: Abnormal   Collection Time: 01/01/20  7:24 AM  Result Value Ref Range   Glucose-Capillary 147 (H) 70 - 99 mg/dL   Comment 1 Notify RN   CBC     Status: Abnormal   Collection Time: 01/01/20  8:00 AM  Result Value Ref Range   WBC 3.0 (L) 4.0 - 10.5 K/uL   RBC 3.64 (L) 4.22 - 5.81 MIL/uL   Hemoglobin 10.5 (L) 13.0 - 17.0 g/dL   HCT 32.3 (L) 39.0 - 52.0 %   MCV 88.7 80.0 - 100.0 fL   MCH 28.8 26.0 - 34.0 pg   MCHC 32.5 30.0 - 36.0 g/dL   RDW 13.9 11.5 - 15.5 %   Platelets 152 150 - 400 K/uL   nRBC 1.6 (H) 0.0 - 0.2 %  Basic metabolic panel     Status: Abnormal    Collection Time: 01/01/20 10:15 AM  Result Value Ref Range   Sodium 133 (L) 135 - 145 mmol/L   Potassium 3.8 3.5 - 5.1 mmol/L   Chloride 99 98 - 111 mmol/L   CO2 20 (L) 22 - 32 mmol/L   Glucose, Bld 172 (H) 70 - 99 mg/dL   BUN 36 (H) 8 - 23 mg/dL   Creatinine, Ser 1.61 (H) 0.61 - 1.24 mg/dL   Calcium 9.4 8.9 - 10.3 mg/dL   GFR calc non Af Amer 41 (L) >60 mL/min   GFR calc Af Amer 47 (L) >60 mL/min   Anion gap 14 5 - 15    CT ANGIO HEAD W OR WO CONTRAST  Result Date: 12/31/2019 CLINICAL DATA:  Stroke, follow-up EXAM: CT ANGIOGRAPHY HEAD AND NECK TECHNIQUE: Multidetector CT imaging of the head and neck was performed using the standard protocol during bolus administration of intravenous contrast. Multiplanar CT image reconstructions and MIPs were obtained to evaluate the vascular anatomy. Carotid stenosis measurements (when applicable) are obtained utilizing NASCET criteria, using the distal internal carotid diameter as  the denominator. CONTRAST:  64mL OMNIPAQUE IOHEXOL 350 MG/ML SOLN COMPARISON:  12/17/2019 head CT, 12/29/2019 MRI FINDINGS: CT HEAD FINDINGS Brain: There is no acute intracranial hemorrhage. Small chronic cerebellar infarcts identified. More recent small right cerebellar infarct is better seen on MRI. Gray-white differentiation is preserved. Ventricles are stable in size. No extra-axial collection. Vascular: There is mild intracranial atherosclerotic calcification at the skull base. Skull: Unremarkable. Sinuses: Mild mucosal thickening. Orbits: Orbits are unremarkable. Review of the MIP images confirms the above findings CTA NECK FINDINGS Aortic arch: Mild calcified plaque. Great vessel origins are patent. Right carotid system: Patent. Mild calcified plaque at the ICA origin without measurable stenosis. Left carotid system: Patent. No measurable stenosis at the ICA origin. Vertebral arteries: Patent and codominant. Calcified plaque is present at the left vertebral artery origin with  mild stenosis. Skeleton: Multilevel degenerative changes of the cervical spine. There are bulky anterior bridging osteophytes with indentation of the posterior pharynx and esophagus. Other neck: No neck mass or adenopathy. Upper chest: No apical lung mass. Review of the MIP images confirms the above findings CTA HEAD FINDINGS Anterior circulation: Intracranial internal carotid arteries patent with calcified plaque causing mild stenosis. Anterior and middle cerebral arteries are patent. Posterior circulation: Intracranial vertebral arteries are patent. Posterior inferior cerebellar arteries are patent. Basilar artery is patent. Superior cerebellar arteries are patent. Posterior cerebral arteries are patent. Venous sinuses: As permitted by contrast timing, patent. Review of the MIP images confirms the above findings IMPRESSION: No acute intracranial hemorrhage or new loss of gray-white differentiation. Recent small cerebellar infarct is better seen on MRI. No hemodynamically significant stenosis or evidence of dissection. Electronically Signed   By: Macy Mis M.D.   On: 12/31/2019 13:31   CT HEAD WO CONTRAST  Result Date: 01/01/2020 CLINICAL DATA:  Cerebellar infarct, coronary artery disease, chronic kidney disease, type II diabetes mellitus, hypertension, increased confusion EXAM: CT HEAD WITHOUT CONTRAST TECHNIQUE: Contiguous axial images were obtained from the base of the skull through the vertex without intravenous contrast. Sagittal and coronal MPR images reconstructed from axial data set. COMPARISON:  12/31/2019 FINDINGS: Brain: Scattered motion artifacts significantly limit exam despite repeating images, patient unable to follow commands. Generalized atrophy. Stable ventricular morphology. No midline shift or mass effect. No gross hemorrhage, mass or infarct identified within limitations of motion. Vascular: Mild atherosclerotic calcifications of internal carotid arteries at skull base Skull: No gross  abnormality seen Sinuses/Orbits: Small mucosal retention cyst and mild mucosal thickening in sphenoid sinus Other: N/A IMPRESSION: Exam severely limited by patient motion artifacts. Generalized atrophy without definite acute intracranial abnormalities as above. Electronically Signed   By: Lavonia Dana M.D.   On: 01/01/2020 10:08   CT ANGIO NECK W OR WO CONTRAST  Result Date: 12/31/2019 CLINICAL DATA:  Stroke, follow-up EXAM: CT ANGIOGRAPHY HEAD AND NECK TECHNIQUE: Multidetector CT imaging of the head and neck was performed using the standard protocol during bolus administration of intravenous contrast. Multiplanar CT image reconstructions and MIPs were obtained to evaluate the vascular anatomy. Carotid stenosis measurements (when applicable) are obtained utilizing NASCET criteria, using the distal internal carotid diameter as the denominator. CONTRAST:  45mL OMNIPAQUE IOHEXOL 350 MG/ML SOLN COMPARISON:  12/17/2019 head CT, 12/29/2019 MRI FINDINGS: CT HEAD FINDINGS Brain: There is no acute intracranial hemorrhage. Small chronic cerebellar infarcts identified. More recent small right cerebellar infarct is better seen on MRI. Gray-white differentiation is preserved. Ventricles are stable in size. No extra-axial collection. Vascular: There is mild intracranial atherosclerotic calcification at the  skull base. Skull: Unremarkable. Sinuses: Mild mucosal thickening. Orbits: Orbits are unremarkable. Review of the MIP images confirms the above findings CTA NECK FINDINGS Aortic arch: Mild calcified plaque. Great vessel origins are patent. Right carotid system: Patent. Mild calcified plaque at the ICA origin without measurable stenosis. Left carotid system: Patent. No measurable stenosis at the ICA origin. Vertebral arteries: Patent and codominant. Calcified plaque is present at the left vertebral artery origin with mild stenosis. Skeleton: Multilevel degenerative changes of the cervical spine. There are bulky anterior  bridging osteophytes with indentation of the posterior pharynx and esophagus. Other neck: No neck mass or adenopathy. Upper chest: No apical lung mass. Review of the MIP images confirms the above findings CTA HEAD FINDINGS Anterior circulation: Intracranial internal carotid arteries patent with calcified plaque causing mild stenosis. Anterior and middle cerebral arteries are patent. Posterior circulation: Intracranial vertebral arteries are patent. Posterior inferior cerebellar arteries are patent. Basilar artery is patent. Superior cerebellar arteries are patent. Posterior cerebral arteries are patent. Venous sinuses: As permitted by contrast timing, patent. Review of the MIP images confirms the above findings IMPRESSION: No acute intracranial hemorrhage or new loss of gray-white differentiation. Recent small cerebellar infarct is better seen on MRI. No hemodynamically significant stenosis or evidence of dissection. Electronically Signed   By: Macy Mis M.D.   On: 12/31/2019 13:31   US RENAL  Result Date: 12/30/2019 CLINICAL DATA:  Hematuria EXAM: RENAL / URINARY TRACT ULTRASOUND COMPLETE COMPARISON:  None. FINDINGS: Right Kidney: Renal measurements: 12 x 4.2 x 5.0 cm = volume: 134 mL. Mildly echogenic cortex. Left Kidney: Renal measurements: 10.9 x 4.5 x 4.9 cm = volume: 128 mL. Limited evaluation. Increased cortical echogenicity in a mildly nodular contour. Bladder: Decompressed with a Foley catheter. Other: None. IMPRESSION: 1. Mildly echogenic cortex bilaterally suggesting the possibility medical renal disease. Evaluation of the left kidney is limited. 2. The bladder is decompressed with a Foley catheter. Electronically Signed   By: Dorise Bullion III M.D   On: 12/30/2019 12:19   DG Chest Port 1 View  Result Date: 01/01/2020 CLINICAL DATA:  Metabolic encephalopathy, acute EXAM: PORTABLE CHEST 1 VIEW COMPARISON:  12/27/2019 FINDINGS: The heart size and mediastinal contours are within normal limits.  Both lungs are clear. The visualized skeletal structures are unremarkable. IMPRESSION: No active disease. Electronically Signed   By: Kerby Moors M.D.   On: 01/01/2020 10:10    Assessment/Plan: 4 Days Post-Op   Active Problems:   Closed hip fracture requiring operative repair, right, sequela   Complication of Foley catheter ([place])   Leukocytosis  The patient will continue physical and occupational therapy.  He will require skilled nursing facility upon discharge.  He will follow-up with Dr. Mack Guise in 10 to 14 days postop.  Continue Plavix and aspirin for DVT prophylaxis.   Thornton Park , MD 01/01/2020, 11:05 AM

## 2020-01-01 NOTE — Progress Notes (Signed)
OT Cancellation Note  Patient Details Name: Grant Mcdaniel MRN: [id number] DOB: 11/11/1941   Cancelled Treatment:    Reason Eval/Treat Not Completed: Other (comment). Chart reviewed, spoke with RN. Pt remains very confused this afternoon. Will hold OT tx and re-attempt next date as medically appropriate and as pt is able to meaningfully participate in therapy.  Jeni Salles, MPH, MS, OTR/L ascom [phone] 01/01/20, 4:11 PM

## 2020-01-01 NOTE — Progress Notes (Signed)
PT Cancellation Note  Patient Details Name: Grant Mcdaniel MRN: [id number] DOB: 10/06/1941   Cancelled Treatment:    Reason Eval/Treat Not Completed: Fatigue/lethargy limiting ability to participate   Session attempted again this pm. He remains confused with gown off.  Replaced gown and he promptly pulled it off breaking through snaps at shoulders.  Picking at sheets above his head trying to pull them off the bed.  Some LLE ROM attempted but unproductive at this time.  Pt unable to follow verbal cues for exercises or safe mobility.  Will continue as appropriate.   Chesley Noon 01/01/2020, 12:25 PM

## 2020-01-01 NOTE — Progress Notes (Signed)
Patient keeps taking tele off, Dr. Clementeen Graham notified and gave verbal order to d/c. Lupita Leash

## 2020-01-01 NOTE — Progress Notes (Signed)
Physical Therapy Treatment Patient Details Name: Grant Mcdaniel MRN: [id number] DOB: May 09, 1941 Today's Date: 01/01/2020    History of Present Illness Patient is a 79 year old male who was admitted to [facility] for a R hip hemiarthroplasty secondary to a right femoral neck fracture. Pt. had a cerbellar infarct. Pt. PMHx includes: CAD, CKD, DM with CKD stage III, Hypercholesterolemia, HTN, hypothyroid, PVD, and falls.    PT Comments    Pt in bed upon arrival with telemetry leads pulled off and gown removed.  Pt fidgeting with leads.  Monitor leads replaced along with gown but challenging as pt pulling them back off as soon as I got them back on.  Would respond to questions and stay he was "OK."  Returned a few minutes later with nursing techs as breakfast tray had arrived in attempt to get him to chair to eat.  +3 assist to EOB with very stiff posturing not allowing LE flexion.  He is able to flex hips to come to sitting with min assist but after a few minutes he begins pushing backwards and is unable to maintain sitting with max +1 leaning back across bed with LE's extended.  He requires +3 dependant to return him to supine.  He continues to report he is OK stating he is "tired."    Discussed with nurse techs as pt was +1 mod assist for transfer yesterday.  At this time OOB is deferred for pt and staff safety.  Discussed with RN, PT and MD regarding change of status.   Follow Up Recommendations  SNF     Equipment Recommendations  Rolling walker with 5" wheels;3in1 (PT)    Recommendations for Other Services       Precautions / Restrictions Precautions Precautions: Posterior Hip;Fall Precaution Comments: WBAT Restrictions Weight Bearing Restrictions: Yes RLE Weight Bearing: Weight bearing as tolerated    Mobility  Bed Mobility Overal bed mobility: Needs Assistance Bed Mobility: Rolling;Supine to Sit;Sit to Supine Rolling: Total assist;+2 for physical assistance   Supine to sit:  Total assist;+2 for physical assistance Sit to supine: Total assist;+2 for physical assistance   General bed mobility comments: no attempts to help with bed mobility this am - extension posturing with poor hip flexion in sitting.  Transfers                 General transfer comment: deferred for pt safety  Ambulation/Gait             General Gait Details: unable   Stairs             Wheelchair Mobility    Modified Rankin (Stroke Patients Only)       Balance Overall balance assessment: Needs assistance;History of Falls Sitting-balance support: Feet unsupported Sitting balance-Leahy Scale: Zero       Standing balance-Leahy Scale: Zero Standing balance comment: deferred                            Cognition Arousal/Alertness: Lethargic Behavior During Therapy: Flat affect;Restless Overall Cognitive Status: Impaired/Different from baseline                                 General Comments: unable to follow commands this am      Exercises Other Exercises Other Exercises: see note body    General Comments        Pertinent Vitals/Pain Pain Assessment:  No/denies pain Pain Location: no evidence of pain with movement Pain Intervention(s): Monitored during session    Home Living                      Prior Function            PT Goals (current goals can now be found in the care plan section) Progress towards PT goals: Not progressing toward goals - comment    Frequency    BID      PT Plan Current plan remains appropriate    Co-evaluation              AM-PAC PT "6 Clicks" Mobility   Outcome Measure  Help needed turning from your back to your side while in a flat bed without using bedrails?: Total Help needed moving from lying on your back to sitting on the side of a flat bed without using bedrails?: Total Help needed moving to and from a bed to a chair (including a wheelchair)?: Total Help  needed standing up from a chair using your arms (e.g., wheelchair or bedside chair)?: Total Help needed to walk in hospital room?: Total Help needed climbing 3-5 steps with a railing? : Total 6 Click Score: 6    End of Session   Activity Tolerance: Other (comment) Patient left: in bed;with call bell/phone within reach;with bed alarm set;with nursing/sitter in room Nurse Communication: Other (comment);Mobility status       Time: [id number] PT Time Calculation (min) (ACUTE ONLY): 13 min  Charges:  $Therapeutic Activity: 8-22 mins                    Chesley Noon, PTA 01/01/20, 9:07 AM

## 2020-01-01 NOTE — Progress Notes (Signed)
PROGRESS NOTE                                                                                                                                                                                                             Patient Demographics:    Grant Mcdaniel, is a 79 y.o. male, DOB - 10/01/1941, CR:[id number]  Admit date - 12/27/2019   Admitting Physician Loletha Grayer, MD  Outpatient Primary MD for the patient is [facility]  LOS - 5  Outpatient Specialists: None  Chief Complaint  Patient presents with  . Fall       Brief Narrative 79 year old male with history of coronary artery disease, diabetes mellitus, hypertension, hypothyroidism, PVD, chronic kidney disease stage IIIa presented to the hospital after tripping on his driveway and fell on his right side.  2 weeks back he also had a fall bruising his right eye and forehead.  At that time head CT done was negative for any bleeding or injury.  X-ray done in the ED showed right hip fracture.  Patient admitted to hospital service and underwent right hip hemiarthroplasty on 1/8. On 1/9, when physical therapist was evaluating the patient she noticed patient to have poor coordination in his left leg.  MRI brain was done showing acute small right cerebellar stroke.  Subjective:   Still having some hematuria.  Patient is more confused today and not participating well in conversation.  No new focal weakness on exam.  Assessment  & Plan :   Principal problems:   Closed hip fracture requiring operative repair, right, sequela Pain better controlled.  Continue as needed Vicodin and Robaxin.  (Monitor closely given acute confusion) Continue bowel regimen.  Orthopedics recommends full dose aspirin 325 mg twice daily for DVT prophylaxis (on hold due to hematuria).  Acute right cerebellar stroke ([place]) Recent fall at home hitting her right side of the head 2 weeks  back.  Noted for poor left leg coordination during PT evaluation postop. MRI shows right cerebellar stroke.  Suspected to be due to small vessel disease. 2D echo with normal EF and no wall motion abnormality or thrombus.  Carotid ultrasound without significant stenosis.  Patient chronically on Plavix for PVD.  Neurology recommended dual antiplatelet therapy with aspirin and Plavix, however both on hold due to hematuria.  Continue statin (LDL of 57).  I discussed with neurology today and given active hematuria, recommends using aspirin alone for now.  CT angiogram head and neck done negative for vertebral artery stenosis.  Also recommends 30-day Holter monitoring, to rule out possible A. fib contributing to an embolic stroke.    Active problems Acute metabolic encephalopathy (0000000) Unclear etiology.  Head CT negative for acute findings.  Chest x-ray negative for infiltrate.  Patient afebrile.  Minimize narcotic and benzo for now.  Will check UA to rule out UTI.    Complication of Foley catheter [facility]) Patient has BPH on Flomax.  Patient had Foley placed in with balloon blown up into the prostate.  It was removed and urology placed a new catheter and recommends it to be in for 1 week. Noted for persistent hematuria past 72 hours.  Urology consulted again who evaluated the patient on 1/11 and feels that Foley may be irritating the prostate and causing ongoing bleeding.  Recommended irrigating catheter overnight and voiding trial this morning.   Acute postoperative blood loss anemia Hemoglobin dropped to 7.7 with persistent hematuria.  Received 1 unit PRBC.  Aspirin Plavix on hold.  H&H currently stable.  Pending stool for Hemoccult.  If H&H stable and no further hematuria will resume full dose aspirin.  Hypothyroidism Continue Synthroid  History of CAD and peripheral vascular disease Continue statin.  Plavix on hold for hematuria.  Essential hypertension Stable.  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  Continue sliding scale coverage     Code Status : DNR  Family Communication  : None  Disposition Plan  : SNF possibly in the next 48 hours if no further hematuria, H&H stable and encephalopathy resolved.  Barriers For Discharge : Active symptoms (hematuria, acute encephalopathy)  Consults  : Orthopedic, neurology, urology  Procedures  : Right hemiarthroplasty, MRI brain  DVT Prophylaxis  :  Lovenox -  Lab Results  Component Value Date   PLT 152 01/01/2020    Antibiotics    Anti-infectives (From admission, onward)   Start     Dose/Rate Route Frequency Ordered Stop   12/28/19 1400  ceFAZolin (ANCEF) IVPB 1 g/50 mL premix     1 g 100 mL/hr over 30 Minutes Intravenous Every 6 hours 12/28/19 1151 12/29/19 1900   12/28/19 0747  ceFAZolin (ANCEF) 2-4 GM/100ML-% IVPB    Note to Pharmacy: Norton Blizzard  : cabinet override      12/28/19 0747 12/28/19 0827   12/27/19 2019  ceFAZolin (ANCEF) IVPB 2g/100 mL premix     2 g 200 mL/hr over 30 Minutes Intravenous 30 min pre-op 12/27/19 2019 12/28/19 0835        Objective:   Vitals:   12/31/19 1634 12/31/19 2017 01/01/20 0517 01/01/20 0726  BP: 135/70 (!) 159/71 (!) 157/72 (!) 159/63  Pulse: 72 72 70 93  Resp: 18 18 18 18   Temp: 97.6 F (36.4 C) 97.6 F (36.4 C)  97.6 F (36.4 C)  TempSrc: Oral Oral  Oral  SpO2: 98% 98% 97% 95%  Weight:      Height:        Wt Readings from Last 3 Encounters:  12/27/19 106.1 kg  12/27/19 106.1 kg  12/17/19 104.3 kg     Intake/Output Summary (Last 24 hours) at 01/01/2020 1144 Last data filed at 01/01/2020 0900 Gross per 24 hour  Intake 240 ml  Output 2200 ml  Net -1960 ml   Physical exam Elderly male,  sleepy and confused HEENT: Pallor +, moist mucosa Chest: Clear CVs: Normal S1-S2 GI: Soft, nondistended, nontender, Foley with dark urine (improved from yesterday) Musculoskeletal: Clean dressing over right  hip CNS: Sleepy, arousable to commands, confused, no focal weakness     Data Review:    CBC Recent Labs  Lab 12/27/19 1719 12/28/19 0248 12/29/19 0513 12/30/19 0341 12/31/19 0502 01/01/20 0800  WBC 16.2* 10.9* 10.2 10.9* 11.7* 3.0*  HGB 11.9* 11.0* 7.7* 7.7* 8.7* 10.5*  HCT 38.8* 33.5* 24.0* 23.9* 26.3* 32.3*  PLT 167 148* 128* 132* 146* 152  MCV 93.5 88.9 90.6 89.5 87.7 88.7  MCH 28.7 29.2 29.1 28.8 29.0 28.8  MCHC 30.7 32.8 32.1 32.2 33.1 32.5  RDW 14.1 14.1 14.5 14.4 14.3 13.9  LYMPHSABS 1.4  --   --   --   --   --   MONOABS 0.8  --   --   --   --   --   EOSABS 0.0  --   --   --   --   --   BASOSABS 0.0  --   --   --   --   --     Chemistries  Recent Labs  Lab 12/27/19 1719 12/28/19 0521 12/29/19 0513 12/30/19 0341 12/31/19 0502 01/01/20 1015  NA 138 138 138 135 131* 133*  K 4.6 4.3 4.4 4.8 4.2 3.8  CL 101 102 103 100 98 99  CO2 24 25 26 24 23  20*  GLUCOSE 177* 153* 150* 148* 140* 172*  BUN 26* 26* 29* 30* 29* 36*  CREATININE 1.44* 1.42* 1.44* 1.37* 1.08 1.61*  CALCIUM 9.7 9.4 8.9 9.5 9.6 9.4  AST 37  --   --   --   --   --   ALT 16  --   --   --   --   --   ALKPHOS 101  --   --   --   --   --   BILITOT 1.8*  --   --   --   --   --    ------------------------------------------------------------------------------------------------------------------ Recent Labs    12/30/19 0341  CHOL 113  HDL 30*  LDLCALC 57  TRIG 129  CHOLHDL 3.8    Lab Results  Component Value Date   HGBA1C 7.3 (H) 12/27/2019   ------------------------------------------------------------------------------------------------------------------ No results for input(s): TSH, T4TOTAL, T3FREE, THYROIDAB in the last 72 hours.  Invalid input(s): FREET3 ------------------------------------------------------------------------------------------------------------------ No results for input(s): VITAMINB12, FOLATE, FERRITIN, TIBC, IRON, RETICCTPCT in the last 72 hours.  Coagulation  profile No results for input(s): INR, PROTIME in the last 168 hours.  No results for input(s): DDIMER in the last 72 hours.  Cardiac Enzymes No results for input(s): CKMB, TROPONINI, MYOGLOBIN in the last 168 hours.  Invalid input(s): CK ------------------------------------------------------------------------------------------------------------------ No results found for: BNP  Inpatient Medications  Scheduled Meds: .  stroke: mapping our early stages of recovery book   Does not apply Once  . amLODipine  10 mg Oral Daily   And  . benazepril  40 mg Oral Daily  . atorvastatin  40 mg Oral q1800  . bisoprolol-hydrochlorothiazide  1 tablet Oral Daily  . Chlorhexidine Gluconate Cloth  6 each Topical Daily  . docusate sodium  100 mg Oral BID  . fluticasone  1 spray Each Nare Daily  . insulin aspart  0-5 Units Subcutaneous QHS  . insulin aspart  0-9 Units Subcutaneous TID WC  . levothyroxine  75 mcg Oral Q0600  .  loratadine  10 mg Oral Daily  . senna  1 tablet Oral BID  . tamsulosin  0.4 mg Oral QPC breakfast   Continuous Infusions: . methocarbamol (ROBAXIN) IV     PRN Meds:.acetaminophen, alum & mag hydroxide-simeth, bisacodyl, HYDROcodone-acetaminophen, HYDROcodone-acetaminophen, magnesium citrate, methocarbamol **OR** methocarbamol (ROBAXIN) IV, morphine injection, ondansetron **OR** ondansetron (ZOFRAN) IV, polyethylene glycol  Micro Results Recent Results (from the past 240 hour(s))  SARS CORONAVIRUS 2 (TAT 6-24 HRS) Nasopharyngeal Nasopharyngeal Swab     Status: None   Collection Time: 12/27/19  5:19 PM   Specimen: Nasopharyngeal Swab  Result Value Ref Range Status   SARS Coronavirus 2 NEGATIVE NEGATIVE Final    Comment: (NOTE) SARS-CoV-2 target nucleic acids are NOT DETECTED. The SARS-CoV-2 RNA is generally detectable in upper and lower respiratory specimens during the acute phase of infection. Negative results do not preclude SARS-CoV-2 infection, do not rule  out co-infections with other pathogens, and should not be used as the sole basis for treatment or other patient management decisions. Negative results must be combined with clinical observations, patient history, and epidemiological information. The expected result is Negative. Fact Sheet for Patients: SugarRoll.be Fact Sheet for Healthcare Providers: https://www.woods-mathews.com/ This test is not yet approved or cleared by the [place] FDA and  has been authorized for detection and/or diagnosis of SARS-CoV-2 by FDA under an Emergency Use Authorization (EUA). This EUA will remain  in effect (meaning this test can be used) for the duration of the COVID-19 declaration under Section 56 4(b)(1) of the Act, 21 U.S.C. section 360bbb-3(b)(1), unless the authorization is terminated or revoked sooner. Performed at [facility], [place] [address] [place], [place] [address]   SARS Coronavirus 2 by RT PCR (hospital order, performed in [place] hospital lab)     Status: None   Collection Time: 12/27/19 10:17 PM  Result Value Ref Range Status   SARS Coronavirus 2 NEGATIVE NEGATIVE Final    Comment: (NOTE) SARS-CoV-2 target nucleic acids are NOT DETECTED. The SARS-CoV-2 RNA is generally detectable in upper and lower respiratory specimens during the acute phase of infection. The lowest concentration of SARS-CoV-2 viral copies this assay can detect is 250 copies / mL. A negative result does not preclude SARS-CoV-2 infection and should not be used as the sole basis for treatment or other patient management decisions.  A negative result may occur with improper specimen collection / handling, submission of specimen other than nasopharyngeal swab, presence of viral mutation(s) within the areas targeted by this assay, and inadequate number of viral copies (<250 copies / mL). A negative result must be combined with clinical observations, patient  history, and epidemiological information. Fact Sheet for Patients:   StrictlyIdeas.no Fact Sheet for Healthcare Providers: BankingDealers.co.za This test is not yet approved or cleared  by the [place] FDA and has been authorized for detection and/or diagnosis of SARS-CoV-2 by FDA under an Emergency Use Authorization (EUA).  This EUA will remain in effect (meaning this test can be used) for the duration of the COVID-19 declaration under Section 564(b)(1) of the Act, 21 U.S.C. section 360bbb-3(b)(1), unless the authorization is terminated or revoked sooner. Performed at [facility], [address] [place], [place] [address]     Radiology Reports CT ANGIO HEAD W OR WO CONTRAST  Result Date: 12/31/2019 CLINICAL DATA:  Stroke, follow-up EXAM: CT ANGIOGRAPHY HEAD AND NECK TECHNIQUE: Multidetector CT imaging of the head and neck was performed using the standard protocol during bolus administration of intravenous contrast.  Multiplanar CT image reconstructions and MIPs were obtained to evaluate the vascular anatomy. Carotid stenosis measurements (when applicable) are obtained utilizing NASCET criteria, using the distal internal carotid diameter as the denominator. CONTRAST:  33mL OMNIPAQUE IOHEXOL 350 MG/ML SOLN COMPARISON:  12/17/2019 head CT, 12/29/2019 MRI FINDINGS: CT HEAD FINDINGS Brain: There is no acute intracranial hemorrhage. Small chronic cerebellar infarcts identified. More recent small right cerebellar infarct is better seen on MRI. Gray-white differentiation is preserved. Ventricles are stable in size. No extra-axial collection. Vascular: There is mild intracranial atherosclerotic calcification at the skull base. Skull: Unremarkable. Sinuses: Mild mucosal thickening. Orbits: Orbits are unremarkable. Review of the MIP images confirms the above findings CTA NECK FINDINGS Aortic arch: Mild calcified plaque. Great vessel origins are  patent. Right carotid system: Patent. Mild calcified plaque at the ICA origin without measurable stenosis. Left carotid system: Patent. No measurable stenosis at the ICA origin. Vertebral arteries: Patent and codominant. Calcified plaque is present at the left vertebral artery origin with mild stenosis. Skeleton: Multilevel degenerative changes of the cervical spine. There are bulky anterior bridging osteophytes with indentation of the posterior pharynx and esophagus. Other neck: No neck mass or adenopathy. Upper chest: No apical lung mass. Review of the MIP images confirms the above findings CTA HEAD FINDINGS Anterior circulation: Intracranial internal carotid arteries patent with calcified plaque causing mild stenosis. Anterior and middle cerebral arteries are patent. Posterior circulation: Intracranial vertebral arteries are patent. Posterior inferior cerebellar arteries are patent. Basilar artery is patent. Superior cerebellar arteries are patent. Posterior cerebral arteries are patent. Venous sinuses: As permitted by contrast timing, patent. Review of the MIP images confirms the above findings IMPRESSION: No acute intracranial hemorrhage or new loss of gray-white differentiation. Recent small cerebellar infarct is better seen on MRI. No hemodynamically significant stenosis or evidence of dissection. Electronically Signed   By: Macy Mis M.D.   On: 12/31/2019 13:31   DG Elbow Complete Right  Result Date: 12/27/2019 CLINICAL DATA:  Recent fall with right elbow pain, initial encounter EXAM: RIGHT ELBOW - COMPLETE 3+ VIEW COMPARISON:  None. FINDINGS: Considerable degenerative changes of the elbow joint are noted particularly in the articulation of the humerus and ulna. Soft tissue swelling is noted posteriorly consistent with the recent injury. No joint effusion is seen. Large olecranon spurs are seen as well as some findings suggestive of olecranon bursitis. IMPRESSION: No acute fracture is noted. Soft  tissue changes are seen consistent with the given clinical history. Electronically Signed   By: Inez Catalina M.D.   On: 12/27/2019 16:16   CT HEAD WO CONTRAST  Result Date: 01/01/2020 CLINICAL DATA:  Cerebellar infarct, coronary artery disease, chronic kidney disease, type II diabetes mellitus, hypertension, increased confusion EXAM: CT HEAD WITHOUT CONTRAST TECHNIQUE: Contiguous axial images were obtained from the base of the skull through the vertex without intravenous contrast. Sagittal and coronal MPR images reconstructed from axial data set. COMPARISON:  12/31/2019 FINDINGS: Brain: Scattered motion artifacts significantly limit exam despite repeating images, patient unable to follow commands. Generalized atrophy. Stable ventricular morphology. No midline shift or mass effect. No gross hemorrhage, mass or infarct identified within limitations of motion. Vascular: Mild atherosclerotic calcifications of internal carotid arteries at skull base Skull: No gross abnormality seen Sinuses/Orbits: Small mucosal retention cyst and mild mucosal thickening in sphenoid sinus Other: N/A IMPRESSION: Exam severely limited by patient motion artifacts. Generalized atrophy without definite acute intracranial abnormalities as above. Electronically Signed   By: Lavonia Dana M.D.   On: 01/01/2020  10:08   CT Head Wo Contrast  Result Date: 12/17/2019 CLINICAL DATA:  Fall. EXAM: CT HEAD WITHOUT CONTRAST TECHNIQUE: Contiguous axial images were obtained from the base of the skull through the vertex without intravenous contrast. COMPARISON:  None. FINDINGS: Brain: No evidence of acute infarction, hemorrhage, hydrocephalus, extra-axial collection or mass lesion/mass effect. Small remote bilateral cerebellar infarcts. Mild brain atrophy. Vascular: Atherosclerotic calcification Skull: Right forehead laceration.  No calvarial fracture. Sinuses/Orbits: No evidence of injury IMPRESSION: 1. No evidence of intracranial injury. 2. Right  forehead laceration without fracture. Electronically Signed   By: Monte Fantasia M.D.   On: 12/17/2019 04:30   CT ANGIO NECK W OR WO CONTRAST  Result Date: 12/31/2019 CLINICAL DATA:  Stroke, follow-up EXAM: CT ANGIOGRAPHY HEAD AND NECK TECHNIQUE: Multidetector CT imaging of the head and neck was performed using the standard protocol during bolus administration of intravenous contrast. Multiplanar CT image reconstructions and MIPs were obtained to evaluate the vascular anatomy. Carotid stenosis measurements (when applicable) are obtained utilizing NASCET criteria, using the distal internal carotid diameter as the denominator. CONTRAST:  82mL OMNIPAQUE IOHEXOL 350 MG/ML SOLN COMPARISON:  12/17/2019 head CT, 12/29/2019 MRI FINDINGS: CT HEAD FINDINGS Brain: There is no acute intracranial hemorrhage. Small chronic cerebellar infarcts identified. More recent small right cerebellar infarct is better seen on MRI. Gray-white differentiation is preserved. Ventricles are stable in size. No extra-axial collection. Vascular: There is mild intracranial atherosclerotic calcification at the skull base. Skull: Unremarkable. Sinuses: Mild mucosal thickening. Orbits: Orbits are unremarkable. Review of the MIP images confirms the above findings CTA NECK FINDINGS Aortic arch: Mild calcified plaque. Great vessel origins are patent. Right carotid system: Patent. Mild calcified plaque at the ICA origin without measurable stenosis. Left carotid system: Patent. No measurable stenosis at the ICA origin. Vertebral arteries: Patent and codominant. Calcified plaque is present at the left vertebral artery origin with mild stenosis. Skeleton: Multilevel degenerative changes of the cervical spine. There are bulky anterior bridging osteophytes with indentation of the posterior pharynx and esophagus. Other neck: No neck mass or adenopathy. Upper chest: No apical lung mass. Review of the MIP images confirms the above findings CTA HEAD FINDINGS  Anterior circulation: Intracranial internal carotid arteries patent with calcified plaque causing mild stenosis. Anterior and middle cerebral arteries are patent. Posterior circulation: Intracranial vertebral arteries are patent. Posterior inferior cerebellar arteries are patent. Basilar artery is patent. Superior cerebellar arteries are patent. Posterior cerebral arteries are patent. Venous sinuses: As permitted by contrast timing, patent. Review of the MIP images confirms the above findings IMPRESSION: No acute intracranial hemorrhage or new loss of gray-white differentiation. Recent small cerebellar infarct is better seen on MRI. No hemodynamically significant stenosis or evidence of dissection. Electronically Signed   By: Macy Mis M.D.   On: 12/31/2019 13:31   CT Cervical Spine Wo Contrast  Addendum Date: 12/17/2019   ADDENDUM REPORT: 12/17/2019 09:36 ADDENDUM: Correction to impression #2, the second sentence should read in the upper CERVICAL levels there is also posterior longitudinal ligament ossification. Electronically Signed   By: Monte Fantasia M.D.   On: 12/17/2019 09:36   Result Date: 12/17/2019 CLINICAL DATA:  Fall with laceration. EXAM: CT CERVICAL SPINE WITHOUT CONTRAST TECHNIQUE: Multidetector CT imaging of the cervical spine was performed without intravenous contrast. Multiplanar CT image reconstructions were also generated. COMPARISON:  None. FINDINGS: Alignment: Normal Skull base and vertebrae: Negative for fracture. 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 There is ankylosis from the  occiput to T2. 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 bone island in the C2 body. Soft tissues and spinal canal: No prevertebral fluid or swelling. No visible canal hematoma. Lipoma in the posterior left para median neck the even more simple internal architecture than adjacent fat, up to 3 cm on axial slices. Disc  levels:  Spondylitic changes as noted above. Upper chest: Negative IMPRESSION: 1. No acute finding. 2. 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 In the upper thoracic levels there is also posterior longitudinal ligament ossification. Electronically Signed: By: Monte Fantasia M.D. On: 12/17/2019 09:04   MR BRAIN WO CONTRAST  Result Date: 12/29/2019 CLINICAL DATA:  Acute neuro deficit, stroke suspected. Left leg symptoms. Recent fall sustaining a right femoral neck fracture treated with arthroplasty. EXAM: MRI HEAD WITHOUT CONTRAST TECHNIQUE: Multiplanar, multiecho pulse sequences of the brain and surrounding structures were obtained without intravenous contrast. COMPARISON:  Head CT 12/17/2019 FINDINGS: Brain: There is a 6 mm acute right cerebellar infarct. Small chronic infarcts are present in the cerebellum bilaterally. No intracranial hemorrhage, mass, midline shift, or extra-axial fluid collection is identified. There is moderate cerebral atrophy. A few small foci of T2 hyperintensity in the cerebral white matter are nonspecific and not considered abnormal for age. Vascular: Major intracranial vascular flow voids are preserved. Skull and upper cervical spine: Unremarkable bone marrow signal. Prominent ligamentous thickening posterior to the dens without significant mass effect on the cervicomedullary junction. Sinuses/Orbits: Unremarkable orbits. Small right maxillary sinus mucous retention cyst. Mild scattered mucosal thickening in the paranasal sinuses, greatest in the left sphenoid sinus. Clear mastoid air cells. Other: None. IMPRESSION: 1. Small acute right cerebellar infarct. 2. Chronic bilateral cerebellar infarcts. Electronically Signed   By: Logan Bores M.D.   On: 12/29/2019 11:48   CT ABDOMEN PELVIS W CONTRAST  Result Date: 12/28/2019 CLINICAL DATA:  Abdominal trauma with gross hematuria. EXAM: CT ABDOMEN AND PELVIS WITH CONTRAST TECHNIQUE: Multidetector CT  imaging of the abdomen and pelvis was performed using the standard protocol following bolus administration of intravenous contrast. CONTRAST:  80mL OMNIPAQUE IOHEXOL 300 MG/ML  SOLN COMPARISON:  None. FINDINGS: LOWER CHEST: No basilar pleural or apical pericardial effusion. HEPATOBILIARY: Normal hepatic contours. No intra- or extrahepatic biliary dilatation. Status post cholecystectomy. PANCREAS: Normal pancreas. No ductal dilatation or peripancreatic fluid collection. SPLEEN: Normal. ADRENALS/URINARY TRACT: The adrenal glands are normal. There is right-sided retroperitoneal stranding tracking from the right flank along the course of the right ureter, most evident at the level of the right common iliac bifurcation. The left ureter is normal. There is calcification and scarring of the left kidney upper pole. The urinary bladder is normal for degree of distention. There is a Foley catheter with the balloon inflated within the proximal penile urethra. STOMACH/BOWEL: There is no hiatal hernia. Normal duodenal course and caliber. No small bowel dilatation or inflammation. No focal colonic abnormality. Surgically absent. VASCULAR/LYMPHATIC: There is calcific atherosclerosis of the abdominal aorta. No abdominal or pelvic lymphadenopathy. REPRODUCTIVE: Markedly enlarged prostate measures 6.9 x 5.9 x 6.0 cm. MUSCULOSKELETAL. There is a comminuted fracture of the right femoral neck. There are bridging osteophytes of both sacroiliac joints. There are anterior lumbar vertebral enthesophytes and lower thoracic spinous process enthesophytes. There is moderate-to-severe foraminal stenosis bilaterally at L4-5 and L5-S1. OTHER: None. IMPRESSION: 1. Right-sided retroperitoneal stranding tracking from the right flank along the course of the right ureter, most evident at the level of the right common iliac bifurcation. Given  the recent fall, this might be a sequela of trauma. Ascending urinary tract infection might also cause this  appearance. 2. Foley catheter balloon is inflated within the proximal penile urethra. Removal is recommended. 3. Comminuted fracture of the right femoral neck. 4. Markedly enlarged prostate. 5. Aortic Atherosclerosis (ICD10-I70.0). Electronically Signed   By: Ulyses Jarred M.D.   On: 12/28/2019 03:42   US RENAL  Result Date: 12/30/2019 CLINICAL DATA:  Hematuria EXAM: RENAL / URINARY TRACT ULTRASOUND COMPLETE COMPARISON:  None. FINDINGS: Right Kidney: Renal measurements: 12 x 4.2 x 5.0 cm = volume: 134 mL. Mildly echogenic cortex. Left Kidney: Renal measurements: 10.9 x 4.5 x 4.9 cm = volume: 128 mL. Limited evaluation. Increased cortical echogenicity in a mildly nodular contour. Bladder: Decompressed with a Foley catheter. Other: None. IMPRESSION: 1. Mildly echogenic cortex bilaterally suggesting the possibility medical renal disease. Evaluation of the left kidney is limited. 2. The bladder is decompressed with a Foley catheter. Electronically Signed   By: Dorise Bullion III M.D   On: 12/30/2019 12:19   US Carotid Bilateral (at [facility] and [facility] only)  Result Date: 12/29/2019 CLINICAL DATA:  Acute cerebellar infarct EXAM: BILATERAL CAROTID DUPLEX ULTRASOUND TECHNIQUE: Pearline Cables scale imaging, color Doppler and duplex ultrasound were performed of bilateral carotid and vertebral arteries in the neck. COMPARISON:  12/29/2019 FINDINGS: Criteria: Quantification of carotid stenosis is based on velocity parameters that correlate the residual internal carotid diameter with NASCET-based stenosis levels, using the diameter of the distal internal carotid lumen as the denominator for stenosis measurement. The following velocity measurements were obtained: RIGHT ICA: 101/15 cm/sec CCA: 0000000 cm/sec SYSTOLIC ICA/CCA RATIO:  1.3 ECA: 133 cm/sec LEFT ICA: 98/17 cm/sec CCA: 123456 cm/sec SYSTOLIC ICA/CCA RATIO:  1.0 ECA: 127 cm/sec RIGHT CAROTID ARTERY: Minor echogenic shadowing plaque formation. No hemodynamically significant right ICA  stenosis, velocity elevation, or turbulent flow. Degree of narrowing less than 50%. RIGHT VERTEBRAL ARTERY:  Antegrade LEFT CAROTID ARTERY: Similar scattered minor echogenic plaque formation. No hemodynamically significant left ICA stenosis, velocity elevation, or turbulent flow. LEFT VERTEBRAL ARTERY:  Antegrade IMPRESSION: Minor carotid atherosclerosis. No hemodynamically significant ICA stenosis. Degree of narrowing less than 50% bilaterally by ultrasound criteria. Patent antegrade vertebral flow bilaterally Electronically Signed   By: Jerilynn Mages.  Shick M.D.   On: 12/29/2019 13:55   DG Chest Port 1 View  Result Date: 01/01/2020 CLINICAL DATA:  Metabolic encephalopathy, acute EXAM: PORTABLE CHEST 1 VIEW COMPARISON:  12/27/2019 FINDINGS: The heart size and mediastinal contours are within normal limits. Both lungs are clear. The visualized skeletal structures are unremarkable. IMPRESSION: No active disease. Electronically Signed   By: Kerby Moors M.D.   On: 01/01/2020 10:10   DG Chest Portable 1 View  Result Date: 12/27/2019 CLINICAL DATA:  Right hip pain, hypertension EXAM: PORTABLE CHEST 1 VIEW COMPARISON:  None. FINDINGS: Heart size is mildly enlarged. Aortic atherosclerosis. Both lungs are clear. The visualized skeletal structures are unremarkable. IMPRESSION: 1. No active cardiopulmonary disease. 2. Mild cardiomegaly. 3. Aortic atherosclerosis. Electronically Signed   By: Davina Poke D.O.   On: 12/27/2019 17:44   DG Knee Left Port  Result Date: 12/28/2019 CLINICAL DATA:  Left knee pain. EXAM: PORTABLE LEFT KNEE - 1-2 VIEW COMPARISON:  None. FINDINGS: No evidence of fracture, dislocation, or joint effusion. Advanced degenerative joint disease identified with marked medial compartment narrowing, subchondral sclerosis and marginal spur formation. No fractures or dislocations. IMPRESSION: 1. Advanced degenerative joint disease. 2. No acute findings. Electronically Signed   By: Lovena Le  Clovis Riley M.D.   On:  12/28/2019 11:28   DG Knee Right Port  Result Date: 12/28/2019 CLINICAL DATA:  Right knee pain. EXAM: PORTABLE RIGHT KNEE - 1-2 VIEW COMPARISON:  None. FINDINGS: No acute fracture is identified on this single AP radiograph. There is severe medial compartment joint space narrowing with mild genu varus deformity. Moderate medial and lateral compartment marginal osteophytosis is noted. There is mild diffuse soft tissue swelling. IMPRESSION: 1. Severe medial compartment osteoarthrosis. 2. No acute osseous abnormality identified. Electronically Signed   By: Logan Bores M.D.   On: 12/28/2019 11:42   ECHOCARDIOGRAM COMPLETE  Result Date: 12/30/2019   ECHOCARDIOGRAM REPORT   Patient Name:   JAHSHAWN SCHULTEIS Date of Exam: 12/29/2019 Medical Rec #:  [id number]        Height:       74.0 in Accession #:    [id number]       Weight:       234.0 lb Date of Birth:  [birth year]        BSA:          2.32 m Patient Age:    55 years         BP:           135/56 mmHg Patient Gender: M                HR:           76 bpm. Exam Location:  [facility] Procedure: 2D Echo Indications:     STROKE 434.91/ I163.9  History:         Patient has no prior history of Echocardiogram examinations.  Sonographer:     Arville Go RDCS Referring Phys:  [place] Diagnosing Phys: Bartholome Bill MD IMPRESSIONS  1. Left ventricular ejection fraction, by visual estimation, is 55 to 60%. The left ventricle has normal function. Left ventricular septal wall thickness was mildly increased. There is borderline left ventricular hypertrophy.  2. The left ventricle has no regional wall motion abnormalities.  3. Global right ventricle has normal systolic function.The right ventricular size is normal. No increase in right ventricular wall thickness.  4. Left atrial size was normal.  5. Right atrial size was normal.  6. The mitral valve is grossly normal. Trivial mitral valve regurgitation.  7. The tricuspid valve is grossly normal.  8. The aortic valve is  tricuspid. Aortic valve regurgitation is not visualized.  9. The pulmonic valve was grossly normal. Pulmonic valve regurgitation is not visualized. 10. The atrial septum is grossly normal. FINDINGS  Left Ventricle: Left ventricular ejection fraction, by visual estimation, is 55 to 60%. The left ventricle has normal function. The left ventricle has no regional wall motion abnormalities. There is borderline left ventricular hypertrophy. Right Ventricle: The right ventricular size is normal. No increase in right ventricular wall thickness. Global RV systolic function is has normal systolic function. Left Atrium: Left atrial size was normal in size. Right Atrium: Right atrial size was normal in size Pericardium: There is no evidence of pericardial effusion. Mitral Valve: The mitral valve is grossly normal. Trivial mitral valve regurgitation. Tricuspid Valve: The tricuspid valve is grossly normal. Tricuspid valve regurgitation is mild. Aortic Valve: The aortic valve is tricuspid. Aortic valve regurgitation is not visualized. Aortic valve peak gradient measures 8.6 mmHg. Pulmonic Valve: The pulmonic valve was grossly normal. Pulmonic valve regurgitation is not visualized. Pulmonic regurgitation is not visualized. Aorta: The aortic root is normal in size and structure. IAS/Shunts: The  atrial septum is grossly normal.  LEFT VENTRICLE PLAX 2D LVIDd:         4.29 cm  Diastology LVIDs:         3.05 cm  LV e' lateral:   7.29 cm/s LV PW:         1.23 cm  LV E/e' lateral: 9.5 LV IVS:        1.21 cm  LV e' medial:    7.51 cm/s LVOT diam:     2.30 cm  LV E/e' medial:  9.2 LV SV:         46 ml LV SV Index:   19.43 LVOT Area:     4.15 cm  RIGHT VENTRICLE RV Basal diam:  3.09 cm RV S prime:     11.40 cm/s TAPSE (M-mode): 2.1 cm LEFT ATRIUM           Index      RIGHT ATRIUM           Index LA diam:      2.30 cm 0.99 cm/m RA Area:     15.70 cm LA Vol (A2C): 18.9 ml 8.13 ml/m RA Volume:   41.10 ml  17.69 ml/m LA Vol (A4C): 12.0 ml  5.16 ml/m  AORTIC VALVE                PULMONIC VALVE AV Area (Vmax): 2.46 cm    PV Vmax:       1.03 m/s AV Vmax:        147.00 cm/s PV Peak grad:  4.2 mmHg AV Peak Grad:   8.6 mmHg LVOT Vmax:      87.20 cm/s LVOT Vmean:     57.100 cm/s LVOT VTI:       0.166 m  AORTA Ao Root diam: 3.70 cm Ao Asc diam:  3.30 cm MV E velocity: 69.40 cm/s 103 cm/s  TRICUSPID VALVE MV A velocity: 70.30 cm/s 70.3 cm/s TV Peak grad:   36.2 mmHg MV E/A ratio:  0.99       1.5       TV Vmax:        3.01 m/s                                      SHUNTS                                     Systemic VTI:  0.17 m                                     Systemic Diam: 2.30 cm  Bartholome Bill MD Electronically signed by Bartholome Bill MD Signature Date/Time: 12/30/2019/8:26:01 AM    Final    DG Hip Port Unilat With Pelvis 1V Right  Result Date: 12/28/2019 CLINICAL DATA:  79 year old male with a history of total hip replacement EXAM: DG HIP (WITH OR WITHOUT PELVIS) 1V PORT RIGHT COMPARISON:  12/27/2019 FINDINGS: Interval surgical changes of right hip arthroplasty. Gas and swelling at the surgical bed. Surgical staples project within the soft tissues. Urinary catheter in position. Left hip with degenerative changes.  Bony pelvic ring intact. IMPRESSION: Early surgical changes of right hip arthroplasty without complicating features. Urinary catheter. Electronically Signed  By: Corrie Mckusick D.O.   On: 12/28/2019 11:30   DG Hip Unilat W or Wo Pelvis 2-3 Views Right  Result Date: 12/27/2019 CLINICAL DATA:  Fall. EXAM: DG HIP (WITH OR WITHOUT PELVIS) 2-3V RIGHT COMPARISON:  No recent. FINDINGS: Diffuse severe osteopenia and degenerative change. Angulated fracture of the right femoral neck is noted. No evidence of dislocation. IMPRESSION: 1.  Angulated right femoral neck fracture. 2.  Diffuse severe osteopenia degenerative change. Electronically Signed   By: Marcello Moores  Register   On: 12/27/2019 16:16    Time Spent in minutes 35   Calysta Craigo M.D on  01/01/2020 at 11:44 AM  Between 7am to 7pm - Pager - [phone]  After 7pm go to [email or web address] - password [facility]  [facility] -  Office  [phone]

## 2020-01-01 NOTE — Progress Notes (Signed)
01/01/20  Urology consult follow-up  Foley catheter inadvertently removed by nurse last night at 7 PM. Patient has been voiding spontaneously since then. He had a condom catheter earlier today. Random bladder scan checked by the nurse 143 cc. Not passing clots. Mental status changed today, confused and not following commands  Blood pressure (!) 112/92, pulse 95, temperature 97.7 F (36.5 C), temperature source Oral, resp. rate 18, height 6\' 2"  (1.88 m), weight 106.1 kg, SpO2 96 %. Sleeping, arousable but not conversive Abdomen soft, no suprapubic tenderness Condom catheter has become detached with very light maroon cranberry urine without clots  CBC    Component Value Date/Time   WBC 3.0 (L) 01/01/2020 0800   RBC 3.64 (L) 01/01/2020 0800   HGB 10.5 (L) 01/01/2020 0800   HCT 32.3 (L) 01/01/2020 0800   PLT 152 01/01/2020 0800   MCV 88.7 01/01/2020 0800   MCH 28.8 01/01/2020 0800   MCHC 32.5 01/01/2020 0800   RDW 13.9 01/01/2020 0800   LYMPHSABS 1.4 12/27/2019 1719   MONOABS 0.8 12/27/2019 1719   EOSABS 0.0 12/27/2019 1719   BASOSABS 0.0 12/27/2019 1719   BMET    Component Value Date/Time   NA 133 (L) 01/01/2020 1015   K 3.8 01/01/2020 1015   CL 99 01/01/2020 1015   CO2 20 (L) 01/01/2020 1015   GLUCOSE 172 (H) 01/01/2020 1015   BUN 36 (H) 01/01/2020 1015   CREATININE 1.61 (H) 01/01/2020 1015   CALCIUM 9.4 01/01/2020 1015   GFRNONAA 41 (L) 01/01/2020 1015   GFRAA 47 (L) 01/01/2020 1015   A/P:  1. Gross hematuria-appears to be improving. Urine color more maroon in nature consistent with old blood. Not passing clots. Adequately emptying. Continue to hold antiplatelet therapy, aspirin/Lovenox okay. Hemoglobin is stabilized. Anticipate continued spontaneous resolution of gross hematuria.  2. Creatinine mildly elevated today-adequate emptying as above. If creatinine continues to rise, consider renal bladder ultrasound.  3. Mental status change-agree with UA/urine culture.  Notably, when irrigating yesterday, the patient's urine did have a somewhat foul smell. Order still pending.  Urology will continue to follow along.  Hollice Espy, MD

## 2020-01-02 ENCOUNTER — Inpatient Hospital Stay: Payer: Medicare Other

## 2020-01-02 ENCOUNTER — Inpatient Hospital Stay: Payer: Medicare Other | Admitting: Hematology and Oncology

## 2020-01-02 LAB — CBC WITH DIFFERENTIAL/PLATELET
Abs Immature Granulocytes: 1.92 10*3/uL — ABNORMAL HIGH (ref 0.00–0.07)
Basophils Absolute: 0.1 10*3/uL (ref 0.0–0.1)
Basophils Relative: 0 %
Eosinophils Absolute: 0 10*3/uL (ref 0.0–0.5)
Eosinophils Relative: 0 %
HCT: 24.9 % — ABNORMAL LOW (ref 39.0–52.0)
Hemoglobin: 8.3 g/dL — ABNORMAL LOW (ref 13.0–17.0)
Immature Granulocytes: 5 %
Lymphocytes Relative: 3 %
Lymphs Abs: 1 10*3/uL (ref 0.7–4.0)
MCH: 29.1 pg (ref 26.0–34.0)
MCHC: 33.3 g/dL (ref 30.0–36.0)
MCV: 87.4 fL (ref 80.0–100.0)
Monocytes Absolute: 1.5 10*3/uL — ABNORMAL HIGH (ref 0.1–1.0)
Monocytes Relative: 4 %
Neutro Abs: 33 10*3/uL — ABNORMAL HIGH (ref 1.7–7.7)
Neutrophils Relative %: 88 %
Platelets: 179 10*3/uL (ref 150–400)
RBC: 2.85 MIL/uL — ABNORMAL LOW (ref 4.22–5.81)
RDW: 14.4 % (ref 11.5–15.5)
Smear Review: UNDETERMINED
WBC Morphology: INCREASED
WBC: 37.5 10*3/uL — ABNORMAL HIGH (ref 4.0–10.5)
nRBC: 0 % (ref 0.0–0.2)

## 2020-01-02 LAB — COMPREHENSIVE METABOLIC PANEL
ALT: 41 U/L (ref 0–44)
AST: 66 U/L — ABNORMAL HIGH (ref 15–41)
Albumin: 2.7 g/dL — ABNORMAL LOW (ref 3.5–5.0)
Alkaline Phosphatase: 173 U/L — ABNORMAL HIGH (ref 38–126)
Anion gap: 12 (ref 5–15)
BUN: 51 mg/dL — ABNORMAL HIGH (ref 8–23)
CO2: 24 mmol/L (ref 22–32)
Calcium: 9.3 mg/dL (ref 8.9–10.3)
Chloride: 98 mmol/L (ref 98–111)
Creatinine, Ser: 1.8 mg/dL — ABNORMAL HIGH (ref 0.61–1.24)
GFR calc Af Amer: 41 mL/min — ABNORMAL LOW (ref >60)
GFR calc non Af Amer: 36 mL/min — ABNORMAL LOW (ref >60)
Glucose, Bld: 118 mg/dL — ABNORMAL HIGH (ref 70–99)
Potassium: 3.8 mmol/L (ref 3.5–5.1)
Sodium: 134 mmol/L — ABNORMAL LOW (ref 135–145)
Total Bilirubin: 4.5 mg/dL — ABNORMAL HIGH (ref 0.3–1.2)
Total Protein: 6.1 g/dL — ABNORMAL LOW (ref 6.5–8.1)

## 2020-01-02 LAB — GLUCOSE, CAPILLARY
Glucose-Capillary: 132 mg/dL — ABNORMAL HIGH (ref 70–99)
Glucose-Capillary: 158 mg/dL — ABNORMAL HIGH (ref 70–99)
Glucose-Capillary: 131 mg/dL — ABNORMAL HIGH (ref 70–99)
Glucose-Capillary: 144 mg/dL — ABNORMAL HIGH (ref 70–99)
Glucose-Capillary: 192 mg/dL — ABNORMAL HIGH (ref 70–99)
Glucose-Capillary: 193 mg/dL — ABNORMAL HIGH (ref 70–99)

## 2020-01-02 LAB — CBC
HCT: 24.4 % — ABNORMAL LOW (ref 39.0–52.0)
Hemoglobin: 8.2 g/dL — ABNORMAL LOW (ref 13.0–17.0)
MCH: 29.3 pg (ref 26.0–34.0)
MCHC: 33.6 g/dL (ref 30.0–36.0)
MCV: 87.1 fL (ref 80.0–100.0)
Platelets: 177 10*3/uL (ref 150–400)
RBC: 2.8 MIL/uL — ABNORMAL LOW (ref 4.22–5.81)
RDW: 14.2 % (ref 11.5–15.5)
WBC: 39.5 10*3/uL — ABNORMAL HIGH (ref 4.0–10.5)
nRBC: 0 % (ref 0.0–0.2)

## 2020-01-02 LAB — RETIC PANEL
Immature Retic Fract: 23.5 % — ABNORMAL HIGH (ref 2.3–15.9)
RBC.: 2.88 MIL/uL — ABNORMAL LOW (ref 4.22–5.81)
Retic Count, Absolute: 79.2 10*3/uL (ref 19.0–186.0)
Retic Ct Pct: 2.8 % (ref 0.4–3.1)
Reticulocyte Hemoglobin: 27.2 pg — ABNORMAL LOW (ref >27.9)

## 2020-01-02 LAB — PROTIME-INR
INR: 1.2 (ref 0.8–1.2)
Prothrombin Time: 15.3 seconds — ABNORMAL HIGH (ref 11.4–15.2)

## 2020-01-02 LAB — OCCULT BLOOD X 1 CARD TO LAB, STOOL: Fecal Occult Bld: NEGATIVE

## 2020-01-02 LAB — TYPE AND SCREEN
ABO/RH(D): A POS
Antibody Screen: NEGATIVE

## 2020-01-02 MED ORDER — VANCOMYCIN VARIABLE DOSE PER UNSTABLE RENAL FUNCTION (PHARMACIST DOSING): Status: DC

## 2020-01-02 MED ORDER — VANCOMYCIN HCL 2000 MG/400ML IV SOLN
2000.0000 mg | Freq: Once | INTRAVENOUS | Status: AC
  Administered 2020-01-02: 2000 mg via INTRAVENOUS
  Filled 2020-01-02: qty 400

## 2020-01-02 MED ORDER — LACTATED RINGERS IV SOLN: INTRAVENOUS | Status: DC

## 2020-01-02 MED ORDER — SODIUM CHLORIDE 0.9 % IV SOLN
2.0000 g | Freq: Two times a day (BID) | INTRAVENOUS | Status: DC
  Administered 2020-01-02: 2 g via INTRAVENOUS
  Filled 2020-01-02 (×2): qty 2

## 2020-01-02 MED ORDER — ENOXAPARIN SODIUM 40 MG/0.4ML ~~LOC~~ SOLN
40.0000 mg | SUBCUTANEOUS | Status: DC
  Administered 2020-01-02 – 2020-01-05 (×4): 40 mg via SUBCUTANEOUS
  Filled 2020-01-02 (×4): qty 0.4

## 2020-01-02 NOTE — TOC Progression Note (Signed)
Transition of Care [facility]) - Progression Note    Patient Details  Name: Grant Mcdaniel MRN: [id number] Date of Birth: 1941/11/10  [facility]) CM/SW [place], RN Phone Number: 01/02/2020, 1:08 PM  Clinical Narrative:     Met with the patient to discuss bed offers, he would like to go to [facility], I notified Claiborne Billings at [facility] of the bed choice  Expected Discharge Plan: [place] Barriers to Discharge: Ship broker, Continued Medical Work up  Expected Discharge Plan and Services Expected Discharge Plan: [place] In-house Referral: Clinical Social Work Discharge Planning Services: NA   Living arrangements for the past 2 months: Single Family Home                                       Social Determinants of Health (SDOH) Interventions    Readmission Risk Interventions No flowsheet data found.

## 2020-01-02 NOTE — TOC Progression Note (Signed)
Transition of Care [facility]) - Progression Note    Patient Details  Name: Grant Mcdaniel MRN: [id number] Date of Birth: 21-Jul-1941  Transition of Care [facility]) CM/SW [place], RN Phone Number: 01/02/2020, 1:01 PM  Clinical Narrative:    Notified Physician that the patient needs a new civid test to DC   Expected Discharge Plan: [place] Barriers to Discharge: Ship broker, Continued Medical Work up  Expected Discharge Plan and Services Expected Discharge Plan: [place] In-house Referral: Clinical Social Work Discharge Planning Services: NA   Living arrangements for the past 2 months: Single Family Home                                       Social Determinants of Health (SDOH) Interventions    Readmission Risk Interventions No flowsheet data found.

## 2020-01-02 NOTE — Progress Notes (Signed)
Physical Therapy Treatment Patient Details Name: Grant Mcdaniel MRN: [id number] DOB: Feb 10, 1941 Today's Date: 01/02/2020    History of Present Illness Patient is a 79 year old male who was admitted to [facility] for a R hip hemiarthroplasty secondary to a right femoral neck fracture. Pt. had a cerbellar infarct. Pt. PMHx includes: CAD, CKD, DM with CKD stage III, Hypercholesterolemia, HTN, hypothyroid, PVD, and falls.    PT Comments    Pt is making good progress towards goals with improved ability to perform transfers and ambulate this date. During initial standing, post lean, however able to self correct with cues. Performed standing heel raises x 10 with cga, demonstrates increased A/P sway. Able to improve ambulation distance this session with cues required for safety/sequencing. Transfer to [facility] for BM with +2 assist. At this time, due to wax/waning, still recommend +2 for safety. WIll continue to progress as able.   Follow Up Recommendations  SNF     Equipment Recommendations  Rolling walker with 5" wheels;3in1 (PT)    Recommendations for Other Services       Precautions / Restrictions Precautions Precautions: Posterior Hip;Fall Precaution Booklet Issued: Yes (comment) Precaution Comments: WBAT Restrictions Weight Bearing Restrictions: Yes RLE Weight Bearing: Weight bearing as tolerated Other Position/Activity Restrictions: New CVA following Hip surgery    Mobility  Bed Mobility Overal bed mobility: Needs Assistance Bed Mobility: Supine to Sit     Supine to sit: Max assist;Mod assist;+2 for physical assistance     General bed mobility comments: follows commands this date and demonstrates improved posture while seated at EOB.   Transfers Overall transfer level: Needs assistance Equipment used: Rolling walker (2 wheeled) Transfers: Sit to/from Stand Sit to Stand: +2 physical assistance;Min assist         General transfer comment: bed elevated prior to transfer. Needs  cues for hand/foot placement. Once standing, post leaning, however able to self correct  Ambulation/Gait Ambulation/Gait assistance: Min assist Gait Distance (Feet): 50 Feet Assistive device: Rolling walker (2 wheeled) Gait Pattern/deviations: Step-through pattern     General Gait Details: ambulated in room with RW with cues for gait sequencing and keeping RW closer. Tends to look down, however will occasionally look up when cued. Safe technique during turns   Marine scientist [place] (Stroke Patients Only)       Balance Overall balance assessment: Needs assistance Sitting-balance support: Feet unsupported;No upper extremity supported Sitting balance-Leahy Scale: Good Sitting balance - Comments: Steady static sitting at EOB w/o UE support Postural control: Left lateral lean Standing balance support: During functional activity;Bilateral upper extremity supported Standing balance-Leahy Scale: Fair Standing balance comment: Mild posterior lean with standing. Heavy BUE support on RW.                            Cognition Arousal/Alertness: Awake/alert Behavior During Therapy: WFL for tasks assessed/performed Overall Cognitive Status: Within Functional Limits for tasks assessed                                 General Comments: Pt A&O x4 follows VCs consistently with occasional increased time for processing. Able to return verbalize understanding of education provided on posterior THPs.      Exercises Other Exercises Other Exercises: Pt educated in falls prevention strategies for home and hospital, compression  stocking management strategies, posterior THPs and implications for ADL management, and safe use of AE for LB ADL management and adherence to posterior THPs. Other Exercises: Pt performs toilet transfer to [facility] with +2 from OT/PT this date. Requires max assist for peri-care with OT assisting with hygiene and PT  providing physical support for balance/safety. Other Exercises: Seated ther-ex performed on B LE including LAQ, hip abd/add, AP, quad sets, glut sets. 10 reps with min assist.     General Comments        Pertinent Vitals/Pain Pain Assessment: No/denies pain    Home Living                      Prior Function            PT Goals (current goals can now be found in the care plan section) Acute Rehab PT Goals Patient Stated Goal: To regain independence PT Goal Formulation: With patient Time For Goal Achievement: 01/12/20 Potential to Achieve Goals: Fair Progress towards PT goals: Progressing toward goals    Frequency    BID      PT Plan Current plan remains appropriate    Co-evaluation PT/OT/SLP Co-Evaluation/Treatment: Yes Reason for Co-Treatment: To address functional/ADL transfers;Complexity of the patient's impairments (multi-system involvement);For patient/therapist safety PT goals addressed during session: Balance;Strengthening/ROM;Mobility/safety with mobility;Proper use of DME OT goals addressed during session: ADL's and self-care;Proper use of Adaptive equipment and DME      AM-PAC PT "6 Clicks" Mobility   Outcome Measure  Help needed turning from your back to your side while in a flat bed without using bedrails?: A Little Help needed moving from lying on your back to sitting on the side of a flat bed without using bedrails?: A Lot Help needed moving to and from a bed to a chair (including a wheelchair)?: A Lot Help needed standing up from a chair using your arms (e.g., wheelchair or bedside chair)?: A Lot Help needed to walk in hospital room?: A Lot Help needed climbing 3-5 steps with a railing? : Total 6 Click Score: 12    End of Session Equipment Utilized During Treatment: Gait belt Activity Tolerance: Patient tolerated treatment well Patient left: in chair;with chair alarm set Nurse Communication: Mobility status PT Visit Diagnosis:  Unsteadiness on feet (R26.81);Other abnormalities of gait and mobility (R26.89);Muscle weakness (generalized) (M62.81);History of falling (Z91.81);Difficulty in walking, not elsewhere classified (R26.2)     Time: [id number] PT Time Calculation (min) (ACUTE ONLY): 35 min  Charges:  $Gait Training: 8-22 mins $Therapeutic Exercise: 8-22 mins                     Greggory Stallion, PT, DPT [phone]    Grant Mcdaniel 01/02/2020, 2:57 PM

## 2020-01-02 NOTE — Progress Notes (Signed)
Pharmacy Antibiotic Note  Grant Mcdaniel is a 79 y.o. male admitted on 12/27/2019. Patient being followed by urology. SCr continues to rise, but with improvement in hematuria. Pharmacy has been consulted for vancomycin and cefepime dosing.  Plan: Will give vancomycin 2000 mg IV x 1 today. Plan for tentative maintenance dose of 1250 mg q24h although expect this to likely change as renal function has continued to worsen. Will f/u renal function with morning labs and reassess dose of vanc or need to dose per levels.  Cefepime 2 g IV q12h based on current renal function  Height: 6\' 2"  (188 cm) Weight: 234 lb (106.1 kg) IBW/kg (Calculated) : 82.2  Temp (24hrs), Avg:99.7 F (37.6 C), Min:97.8 F (36.6 C), Max:101.6 F (38.7 C)  Recent Labs  Lab 12/29/19 0513 12/30/19 0341 12/31/19 0502 01/01/20 0800 01/01/20 1015 01/02/20 0459 01/02/20 0906  WBC 10.2 10.9* 11.7* 3.0*  --  39.5* 37.5*  CREATININE 1.44* 1.37* 1.08  --  1.61*  --  1.80*    Estimated Creatinine Clearance: 44.6 mL/min (A) (by C-G formula based on SCr of 1.8 mg/dL (H)).    Allergies  Allergen Reactions  . Sulfa Antibiotics Nausea And Vomiting    Antimicrobials this admission: Vancomycin 1/13 >>  Cefepime 1/13 >>   Dose adjustments this admission: NA  Microbiology results: 1/13 BCx: pending 1/13 UCx: ordered    Thank you for allowing pharmacy to be a part of this patient's care.  Tawnya Crook, PharmD 01/02/2020 3:41 PM

## 2020-01-02 NOTE — Progress Notes (Signed)
01/02/20  Urology consult follow-up  Now voiding spontaneously in condom catheter.  Urine continues to clear.  Is now very light tea colored.  Mental status seems to be improving, up eating lunch and answering questions today.  Creatinine continues to rise as well as significant leukocytosis today.  Afebrile.  UA yesterday with greater than 30 red and white blood cells, no bacteria.  No culture sent.  Blood pressure (!) 112/92, pulse 95, temperature 97.7 F (36.5 C), temperature source Oral, resp. rate 18, height 6\' 2"  (1.88 m), weight 106.1 kg, SpO2 96 %. Eating lunch, conversive today Abdomen soft, no suprapubic tenderness Condom cath with dilute tea colored urine, no clots  CBC    Component Value Date/Time   WBC 37.5 (H) 01/02/2020 0906   RBC 2.85 (L) 01/02/2020 0906   RBC 2.88 (L) 01/02/2020 0906   HGB 8.3 (L) 01/02/2020 0906   HCT 24.9 (L) 01/02/2020 0906   PLT 179 01/02/2020 0906   MCV 87.4 01/02/2020 0906   MCH 29.1 01/02/2020 0906   MCHC 33.3 01/02/2020 0906   RDW 14.4 01/02/2020 0906   LYMPHSABS 1.0 01/02/2020 0906   MONOABS 1.5 (H) 01/02/2020 0906   EOSABS 0.0 01/02/2020 0906   BASOSABS 0.1 01/02/2020 0906   BMET    Component Value Date/Time   NA 134 (L) 01/02/2020 0906   K 3.8 01/02/2020 0906   CL 98 01/02/2020 0906   CO2 24 01/02/2020 0906   GLUCOSE 118 (H) 01/02/2020 0906   BUN 51 (H) 01/02/2020 0906   CREATININE 1.80 (H) 01/02/2020 0906   CALCIUM 9.3 01/02/2020 0906   GFRNONAA 36 (L) 01/02/2020 0906   GFRAA 41 (L) 01/02/2020 0906   A/P:  1. Gross hematuria-continued improvement.  No further intervention recommended.  Voiding spontaneously with adequate emptying.  Avoid catheter replacement as possible given recent prostatic trauma.  2.  Rising creatinine-unlikely related to obstruction.  Bladder scan yesterday fairly minimal.  Consider renal ultrasound as needed.  Hollice Espy, MD  Urology will follow peripherally.  Please reach out to [place] if there  is any further concerns or questions or condition worsens.

## 2020-01-02 NOTE — Progress Notes (Signed)
Occupational Therapy Treatment Patient Details Name: Grant Mcdaniel MRN: [id number] DOB: [birth year] Today's Date: 01/02/2020    History of present illness Patient is a 79 year old male who was admitted to [facility] for a R hip hemiarthroplasty secondary to a right femoral neck fracture. Pt. had a cerbellar infarct. Pt. PMHx includes: CAD, CKD, DM with CKD stage III, Hypercholesterolemia, HTN, hypothyroid, PVD, and falls.   OT comments  Mr. Hodor was seen for OT/PT co-treatment this date. Upon arrival to room, pt awake/alert, semi-supine in bed, agreeable to OT tx session. Pt A&O x4 and denies pain. At start of session pt is able to return verbalize 1/3 posterior hip precautions. He engages well with functional tasks as described below (see ADL section for additional detail) and actively participates throughout therapy session with good safety awareness. Pt continues to require +2 physical assist for functional transfers and toilet hygrine. He requires consistent cueing to turn toward his L side, and adhere to posterior THPs during sit to stand transfers. At end of session, pt able to return verbalize 3/3 posterior THPs independently. Pt making good progress toward goals and continues to benefit from skilled OT services to maximize return to PLOF and minimize risk of future falls, injury, caregiver burden, and readmission. Will continue to follow POC as written. Discharge recommendation remains appropriate.    Follow Up Recommendations  SNF    Equipment Recommendations  3 in 1 bedside commode    Recommendations for Other Services      Precautions / Restrictions Precautions Precautions: Posterior Hip;Fall Restrictions Weight Bearing Restrictions: Yes RLE Weight Bearing: Weight bearing as tolerated Other Position/Activity Restrictions: New CVA following Hip surgery       Mobility Bed Mobility Overal bed mobility: Needs Assistance Bed Mobility: Supine to Sit     Supine to sit: Max  assist;Mod assist;+2 for physical assistance     General bed mobility comments: Pt demonstrates improved ability to participate in bed and functional mobility this date. He performs sup>sit with mod/max +2 assist for mgt of BLE and trunk support this date. Prompting for sequencing and hand placement provided throughout.  Transfers Overall transfer level: Needs assistance Equipment used: Rolling walker (2 wheeled) Transfers: Sit to/from Stand Sit to Stand: +2 physical assistance;Min assist         General transfer comment: Pt able to perform STS from bed and BSC this date. He requires min A +2 for safety and consistent cueing for hand/foot placement.    Balance Overall balance assessment: Needs assistance Sitting-balance support: Feet unsupported;No upper extremity supported Sitting balance-Leahy Scale: Good Sitting balance - Comments: Steady static sitting at EOB w/o UE support   Standing balance support: During functional activity;Bilateral upper extremity supported Standing balance-Leahy Scale: Fair Standing balance comment: Mild posterior lean with standing. Heavy BUE support on RW.                           ADL either performed or assessed with clinical judgement   ADL Overall ADL's : Needs assistance/impaired                         Toilet Transfer: RW;+2 for safety/equipment;+2 for physical assistance;Minimal assistance;BSC Toilet Transfer Details (indicate cue type and reason): Pt performs functional transfer to bedside commode this date. He is able to use the RW with +2 assist for safety and heavy cueing for hand/foot placement during STS t/fs. Consistent cueing for adherence  to hip precautions as well. Toileting- Clothing Manipulation and Hygiene: Sit to/from stand;Maximal assistance;+2 for physical assistance;+2 for safety/equipment Toileting - Clothing Manipulation Details (indicate cue type and reason): Pt unable to hold wipe to complete peri-care  while standing with RW this date Requires CGA to min A to maintain standing balance as well as max A for peri-care. Pt contiues to be motivated to do as much as possible for himself and puts forth good effort during functional tasks. Cueing t/o ADLs for adherence to hip precautions.       General ADL Comments: Pt education on adherence to hip precautions during ADL management. This Pryor Curia also reviewed use of AE for LB ADL management. Pt return verbalizes understanding of education and 3/3 hip precautions at end of session. Would benefit from opportunity to trial AE for LB ADL mgt.     Vision Baseline Vision/History: Wears glasses Wears Glasses: At all times Patient Visual Report: No change from baseline     Perception     Praxis      Cognition Arousal/Alertness: Awake/alert Behavior During Therapy: WFL for tasks assessed/performed Overall Cognitive Status: Within Functional Limits for tasks assessed                                 General Comments: Pt A&O x4 follows VCs consistently with occasional increased time for processing. Able to return verbalize understanding of education provided on posterior THPs.        Exercises Other Exercises Other Exercises: Pt educated in falls prevention strategies for home and hospital, compression stocking management strategies, posterior THPs and implications for ADL management, and safe use of AE for LB ADL management and adherence to posterior THPs. Other Exercises: Pt performs toilet transfer to [facility] with +2 from OT/PT this date. Requires max assist for peri-care with OT assisting with hygiene and PT providing physical support for balance/safety.   Shoulder Instructions       General Comments      Pertinent Vitals/ Pain       Pain Assessment: No/denies pain  Home Living                                          Prior Functioning/Environment              Frequency  Min 2X/week        Progress  Toward Goals  OT Goals(current goals can now be found in the care plan section)  Progress towards OT goals: Progressing toward goals  Acute Rehab OT Goals Patient Stated Goal: To regain independence  Plan Discharge plan remains appropriate    Co-evaluation    PT/OT/SLP Co-Evaluation/Treatment: Yes Reason for Co-Treatment: Necessary to address cognition/behavior during functional activity;For patient/therapist safety;To address functional/ADL transfers PT goals addressed during session: Mobility/safety with mobility;Balance;Proper use of DME;Strengthening/ROM OT goals addressed during session: ADL's and self-care;Proper use of Adaptive equipment and DME      AM-PAC OT "6 Clicks" Daily Activity     Outcome Measure   Help from another person eating meals?: A Little Help from another person taking care of personal grooming?: A Little Help from another person toileting, which includes using toliet, bedpan, or urinal?: A Lot Help from another person bathing (including washing, rinsing, drying)?: A Lot Help from another person to put on and taking off regular  upper body clothing?: A Little Help from another person to put on and taking off regular lower body clothing?: A Lot 6 Click Score: 15    End of Session Equipment Utilized During Treatment: Gait belt;Rolling walker;Other (comment)(Bedside commode)  OT Visit Diagnosis: History of falling (Z91.81);Other abnormalities of gait and mobility (R26.89)   Activity Tolerance Patient tolerated treatment well   Patient Left with call bell/phone within reach;in chair;with chair alarm set;Other (comment)(With PT in room to complete session.)   Nurse Communication Other (comment)(RN/NT notified pt had BM during session.)        Time: [id number] OT Time Calculation (min): 41 min  Charges: OT General Charges $OT Visit: 1 Visit OT Treatments $Self Care/Home Management : 23-37 mins  Shara Blazing, M.S., OTR/L Ascom:  [phone] 01/02/20, 11:59 AM

## 2020-01-02 NOTE — Progress Notes (Signed)
D: Pt alert and oriented x 4 this morning however some confusion and restlessness is observed as the day progresses. Pt has taken 2 condom caths off. Pt denies experiencing any pain at this time. Pt did experience an elevated temp which was reported to MD. Tylenol was given and MD ordered antibiotics.   A: Scheduled medications administered to pt, per MD orders. Support and encouragement provided. Frequent verbal contact made.    R: No adverse drug reactions noted. Pt complaint with medications and treatment plan. Pt interacts well with staff on the unit. Pt is stable at this time, will continue to monitor and provide care for as ordered.

## 2020-01-02 NOTE — Plan of Care (Signed)
Pt has developed a rash over most of his face that wasn't present at beginning of shift.  Raised papules that are not inflamed and not itchy and don't seem to bother pt.  He hasn't had any new meds or eaten anything to cause break out.  Notified night NP who acknowledged and no new orders were placed. Pt answers questions appropriately and has not acted impulsively this shift - pulling off condom cath or gown, as he has done previously. Urine specimen sent and does show presence of leukocytes.

## 2020-01-02 NOTE — Progress Notes (Addendum)
PROGRESS NOTE                                                                                                                                                                                                             Patient Demographics:    Grant Mcdaniel, is a 79 y.o. male, DOB - 30-Jul-1941, CR:[id number]  Admit date - 12/27/2019   Admitting Physician Loletha Grayer, MD  Outpatient Primary MD for the patient is Center, [facility]  LOS - 6  Outpatient Specialists: None  Chief Complaint  Patient presents with  . Fall       Brief Narrative 79 year old male with history of coronary artery disease, diabetes mellitus, hypertension, hypothyroidism, PVD, chronic kidney disease stage IIIa presented to the hospital after tripping on his driveway and fell on his right side.  2 weeks back he also had a fall bruising his right eye and forehead.  At that time head CT done was negative for any bleeding or injury.  X-ray done in the ED showed right hip fracture.  Patient admitted to hospital service and underwent right hip hemiarthroplasty on 1/8. On 1/9, when physical therapist was evaluating the patient she noticed patient to have poor coordination in his left leg.  MRI brain was done showing acute small right cerebellar stroke.  Subjective:   Hematuria seems to be improving.  No nausea no vomiting no fever no chills.  Patient denies having any chest pain abdominal pain diarrhea or constipation. Also denies any shortness of breath.   Assessment  & Plan :   Principal problems:   Closed hip fracture requiring operative repair, right, sequela Pain better controlled.  Continue as needed Vicodin and Robaxin.  (Monitor closely given acute confusion) Continue bowel regimen.  Orthopedics recommends full dose aspirin 325 mg twice daily for DVT prophylaxis (on hold due to hematuria).  Acute right cerebellar stroke  ([place]) Recent fall at home hitting her right side of the head 2 weeks back.  Noted for poor left leg coordination during PT evaluation postop. MRI shows right cerebellar stroke.  Suspected to be due to small vessel disease. 2D echo with normal EF and no wall motion abnormality or thrombus.  Carotid ultrasound without significant stenosis.  Patient chronically on Plavix for PVD.  Neurology recommended dual antiplatelet therapy with aspirin and Plavix, however both on  hold due to hematuria.  Continue statin (LDL of 57). I discussed with neurology today and given active hematuria, recommends using aspirin alone for now.  CT angiogram head and neck done negative for vertebral artery stenosis.  Also recommends 30-day Holter monitoring, to rule out possible A. fib contributing to an embolic stroke.  Possible sepsis. Patient's temperature was elevated.  Leukocytosis was significantly worsening. Significant bandemia seen as well. Patient's blood cultures were performed. Urine cultures were performed. Chest x-ray was performed which did not show any pneumonia. Patient started on IV vancomycin and cefepime for broad-spectrum coverage for now. We will continue the antibiotic and monitor response. IV fluid with LR.  Active problems Acute metabolic encephalopathy (0000000) Unclear etiology.  Head CT negative for acute findings.  Chest x-ray negative for infiltrate.  Patient afebrile.  Minimize narcotic and benzo for now.  Will check UA to rule out UTI.    Complication of Foley catheter [facility]) Patient has BPH on Flomax.  Patient had Foley placed in with balloon blown up into the prostate.  It was removed and urology placed a new catheter and recommends it to be in for 1 week. Noted for persistent hematuria past 72 hours.  Urology consulted again who evaluated the patient on 1/11 and feels that Foley may be irritating the prostate and causing ongoing bleeding.  Recommended irrigating catheter overnight and  voiding trial this morning.   Acute postoperative blood loss anemia Hemoglobin dropped to 7.7 with persistent hematuria.  Received 1 unit PRBC.  Aspirin Plavix on hold.  H&H currently stable.  Pending stool for Hemoccult.  If H&H stable and no further hematuria will resume full dose aspirin.  Hypothyroidism Continue Synthroid  History of CAD and peripheral vascular disease Continue statin.  Plavix on hold for hematuria.  Essential hypertension Stable.  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  Continue sliding scale coverage     Code Status : DNR  Family Communication  : None  Disposition Plan  : SNF possibly in the next 48 hours if no further hematuria, H&H stable and encephalopathy resolved.  Barriers For Discharge : Active symptoms (hematuria, acute encephalopathy)  Consults  : Orthopedic, neurology, urology  Procedures  : Right hemiarthroplasty, MRI brain  DVT Prophylaxis  :  Lovenox -  Lab Results  Component Value Date   PLT 179 01/02/2020    Antibiotics    Anti-infectives (From admission, onward)   Start     Dose/Rate Route Frequency Ordered Stop   01/02/20 1700  vancomycin (VANCOREADY) IVPB 2000 mg/400 mL     2,000 mg 200 mL/hr over 120 Minutes Intravenous  Once 01/02/20 1536     01/02/20 1700  ceFEPIme (MAXIPIME) 2 g in sodium chloride 0.9 % 100 mL IVPB     2 g 200 mL/hr over 30 Minutes Intravenous Every 12 hours 01/02/20 1536     01/02/20 1548  vancomycin variable dose per unstable renal function (pharmacist dosing)      Does not apply See admin instructions 01/02/20 1548     12/28/19 1400  ceFAZolin (ANCEF) IVPB 1 g/50 mL premix     1 g 100 mL/hr over 30 Minutes Intravenous Every 6 hours 12/28/19 1151 12/29/19 1900   12/28/19 0747  ceFAZolin (ANCEF) 2-4 GM/100ML-% IVPB    Note to Pharmacy: Norton Blizzard  : cabinet override      12/28/19 0747 12/28/19 0827   12/27/19 2019  ceFAZolin (ANCEF) IVPB  2g/100  mL premix     2 g 200 mL/hr over 30 Minutes Intravenous 30 min pre-op 12/27/19 2019 12/28/19 0835        Objective:   Vitals:   01/02/20 0011 01/02/20 0725 01/02/20 1513 01/02/20 1739  BP: (!) 114/49 (!) 105/49 (!) 139/42 (!) 103/45  Pulse: 74 74 92 94  Resp: 19  15 16   Temp: 97.8 F (36.6 C)  (!) 101.6 F (38.7 C) 99.5 F (37.5 C)  TempSrc: Oral  Axillary Axillary  SpO2: 97% 97% 95% 97%  Weight:      Height:        Wt Readings from Last 3 Encounters:  12/27/19 106.1 kg  12/27/19 106.1 kg  12/17/19 104.3 kg     Intake/Output Summary (Last 24 hours) at 01/02/2020 1901 Last data filed at 01/02/2020 1800 Gross per 24 hour  Intake 312.83 ml  Output 375 ml  Net -62.17 ml   General: alert and oriented to time, place, and person. Appear in mild distress, affect appropriate no significant shaking which is new per nursing. Dark urine in the Foley catheter bag Eyes: PERRL, Conjunctiva normal ENT: Oral Mucosa Clear, moist  Neck: no JVD, no Abnormal Mass Or lumps Cardiovascular: S1 and S2 Present, no Murmur, peripheral pulses symmetrical Respiratory: good respiratory effort, Bilateral Air entry equal and Decreased, no signs of accessory muscle use, Clear to Auscultation, no Crackles, no wheezes Abdomen: Bowel Sound present, Soft and no tenderness, no hernia Skin: no rashes  Extremities: no Pedal edema, no calf tenderness Neurologic: without any new focal findings  Gait not checked due to patient safety concerns       Data Review:    CBC Recent Labs  Lab 12/27/19 1719 12/30/19 0341 12/31/19 0502 01/01/20 0800 01/02/20 0459 01/02/20 0906  WBC 16.2* 10.9* 11.7* 3.0* 39.5* 37.5*  HGB 11.9* 7.7* 8.7* 10.5* 8.2* 8.3*  HCT 38.8* 23.9* 26.3* 32.3* 24.4* 24.9*  PLT 167 132* 146* 152 177 179  MCV 93.5 89.5 87.7 88.7 87.1 87.4  MCH 28.7 28.8 29.0 28.8 29.3 29.1  MCHC 30.7 32.2 33.1 32.5 33.6 33.3  RDW 14.1 14.4 14.3 13.9 14.2 14.4  LYMPHSABS 1.4  --   --   --    --  1.0  MONOABS 0.8  --   --   --   --  1.5*  EOSABS 0.0  --   --   --   --  0.0  BASOSABS 0.0  --   --   --   --  0.1    Chemistries  Recent Labs  Lab 12/27/19 1719 12/29/19 0513 12/30/19 0341 12/31/19 0502 01/01/20 1015 01/02/20 0906  NA 138 138 135 131* 133* 134*  K 4.6 4.4 4.8 4.2 3.8 3.8  CL 101 103 100 98 99 98  CO2 24 26 24 23  20* 24  GLUCOSE 177* 150* 148* 140* 172* 118*  BUN 26* 29* 30* 29* 36* 51*  CREATININE 1.44* 1.44* 1.37* 1.08 1.61* 1.80*  CALCIUM 9.7 8.9 9.5 9.6 9.4 9.3  AST 37  --   --   --   --  66*  ALT 16  --   --   --   --  41  ALKPHOS 101  --   --   --   --  173*  BILITOT 1.8*  --   --   --   --  4.5*   ------------------------------------------------------------------------------------------------------------------ No results for input(s): CHOL, HDL, LDLCALC, TRIG, CHOLHDL, LDLDIRECT in the last  72 hours.  Lab Results  Component Value Date   HGBA1C 7.3 (H) 12/27/2019   ------------------------------------------------------------------------------------------------------------------ No results for input(s): TSH, T4TOTAL, T3FREE, THYROIDAB in the last 72 hours.  Invalid input(s): FREET3 ------------------------------------------------------------------------------------------------------------------ Recent Labs    01/02/20 0906  RETICCTPCT 2.8    Coagulation profile Recent Labs  Lab 01/02/20 0906  INR 1.2    No results for input(s): DDIMER in the last 72 hours.  Cardiac Enzymes No results for input(s): CKMB, TROPONINI, MYOGLOBIN in the last 168 hours.  Invalid input(s): CK ------------------------------------------------------------------------------------------------------------------ No results found for: BNP  Inpatient Medications  Scheduled Meds: .  stroke: mapping our early stages of recovery book   Does not apply Once  . atorvastatin  40 mg Oral q1800  . Chlorhexidine Gluconate Cloth  6 each Topical Daily  . docusate  sodium  100 mg Oral BID  . enoxaparin (LOVENOX) injection  40 mg Subcutaneous Q24H  . fluticasone  1 spray Each Nare Daily  . insulin aspart  0-5 Units Subcutaneous QHS  . insulin aspart  0-9 Units Subcutaneous TID WC  . levothyroxine  75 mcg Oral Q0600  . loratadine  10 mg Oral Daily  . senna  1 tablet Oral BID  . tamsulosin  0.4 mg Oral QPC breakfast  . vancomycin variable dose per unstable renal function (pharmacist dosing)   Does not apply See admin instructions   Continuous Infusions: . ceFEPime (MAXIPIME) IV Stopped (01/02/20 1655)  . methocarbamol (ROBAXIN) IV    . vancomycin 200 mL/hr at 01/02/20 1800   PRN Meds:.acetaminophen, alum & mag hydroxide-simeth, bisacodyl, HYDROcodone-acetaminophen, HYDROcodone-acetaminophen, magnesium citrate, methocarbamol **OR** methocarbamol (ROBAXIN) IV, morphine injection, ondansetron **OR** ondansetron (ZOFRAN) IV, polyethylene glycol  Micro Results Recent Results (from the past 240 hour(s))  SARS CORONAVIRUS 2 (TAT 6-24 HRS) Nasopharyngeal Nasopharyngeal Swab     Status: None   Collection Time: 12/27/19  5:19 PM   Specimen: Nasopharyngeal Swab  Result Value Ref Range Status   SARS Coronavirus 2 NEGATIVE NEGATIVE Final    Comment: (NOTE) SARS-CoV-2 target nucleic acids are NOT DETECTED. The SARS-CoV-2 RNA is generally detectable in upper and lower respiratory specimens during the acute phase of infection. Negative results do not preclude SARS-CoV-2 infection, do not rule out co-infections with other pathogens, and should not be used as the sole basis for treatment or other patient management decisions. Negative results must be combined with clinical observations, patient history, and epidemiological information. The expected result is Negative. Fact Sheet for Patients: SugarRoll.be Fact Sheet for Healthcare Providers: https://www.woods-mathews.com/ This test is not yet approved or cleared by the  [place] FDA and  has been authorized for detection and/or diagnosis of SARS-CoV-2 by FDA under an Emergency Use Authorization (EUA). This EUA will remain  in effect (meaning this test can be used) for the duration of the COVID-19 declaration under Section 56 4(b)(1) of the Act, 21 U.S.C. section 360bbb-3(b)(1), unless the authorization is terminated or revoked sooner. Performed at [facility], [place] [address], [place],  [address]   SARS Coronavirus 2 by RT PCR (hospital order, performed in [facility] hospital lab)     Status: None   Collection Time: 12/27/19 10:17 PM  Result Value Ref Range Status   SARS Coronavirus 2 NEGATIVE NEGATIVE Final    Comment: (NOTE) SARS-CoV-2 target nucleic acids are NOT DETECTED. The SARS-CoV-2 RNA is generally detectable in upper and lower respiratory specimens during the acute phase of infection. The lowest concentration of SARS-CoV-2 viral copies this  assay can detect is 250 copies / mL. A negative result does not preclude SARS-CoV-2 infection and should not be used as the sole basis for treatment or other patient management decisions.  A negative result may occur with improper specimen collection / handling, submission of specimen other than nasopharyngeal swab, presence of viral mutation(s) within the areas targeted by this assay, and inadequate number of viral copies (<250 copies / mL). A negative result must be combined with clinical observations, patient history, and epidemiological information. Fact Sheet for Patients:   StrictlyIdeas.no Fact Sheet for Healthcare Providers: BankingDealers.co.za This test is not yet approved or cleared  by the [place] FDA and has been authorized for detection and/or diagnosis of SARS-CoV-2 by FDA under an Emergency Use Authorization (EUA).  This EUA will remain in effect (meaning this test can be used) for the duration of the COVID-19  declaration under Section 564(b)(1) of the Act, 21 U.S.C. section 360bbb-3(b)(1), unless the authorization is terminated or revoked sooner. Performed at [facility], [address] [place], [place] [address]     Radiology Reports CT ANGIO HEAD W OR WO CONTRAST  Result Date: 12/31/2019 CLINICAL DATA:  Stroke, follow-up EXAM: CT ANGIOGRAPHY HEAD AND NECK TECHNIQUE: Multidetector CT imaging of the head and neck was performed using the standard protocol during bolus administration of intravenous contrast. Multiplanar CT image reconstructions and MIPs were obtained to evaluate the vascular anatomy. Carotid stenosis measurements (when applicable) are obtained utilizing NASCET criteria, using the distal internal carotid diameter as the denominator. CONTRAST:  26mL OMNIPAQUE IOHEXOL 350 MG/ML SOLN COMPARISON:  12/17/2019 head CT, 12/29/2019 MRI FINDINGS: CT HEAD FINDINGS Brain: There is no acute intracranial hemorrhage. Small chronic cerebellar infarcts identified. More recent small right cerebellar infarct is better seen on MRI. Gray-white differentiation is preserved. Ventricles are stable in size. No extra-axial collection. Vascular: There is mild intracranial atherosclerotic calcification at the skull base. Skull: Unremarkable. Sinuses: Mild mucosal thickening. Orbits: Orbits are unremarkable. Review of the MIP images confirms the above findings CTA NECK FINDINGS Aortic arch: Mild calcified plaque. Great vessel origins are patent. Right carotid system: Patent. Mild calcified plaque at the ICA origin without measurable stenosis. Left carotid system: Patent. No measurable stenosis at the ICA origin. Vertebral arteries: Patent and codominant. Calcified plaque is present at the left vertebral artery origin with mild stenosis. Skeleton: Multilevel degenerative changes of the cervical spine. There are bulky anterior bridging osteophytes with indentation of the posterior pharynx and esophagus. Other  neck: No neck mass or adenopathy. Upper chest: No apical lung mass. Review of the MIP images confirms the above findings CTA HEAD FINDINGS Anterior circulation: Intracranial internal carotid arteries patent with calcified plaque causing mild stenosis. Anterior and middle cerebral arteries are patent. Posterior circulation: Intracranial vertebral arteries are patent. Posterior inferior cerebellar arteries are patent. Basilar artery is patent. Superior cerebellar arteries are patent. Posterior cerebral arteries are patent. Venous sinuses: As permitted by contrast timing, patent. Review of the MIP images confirms the above findings IMPRESSION: No acute intracranial hemorrhage or new loss of gray-white differentiation. Recent small cerebellar infarct is better seen on MRI. No hemodynamically significant stenosis or evidence of dissection. Electronically Signed   By: Macy Mis M.D.   On: 12/31/2019 13:31   DG Elbow Complete Right  Result Date: 12/27/2019 CLINICAL DATA:  Recent fall with right elbow pain, initial encounter EXAM: RIGHT ELBOW - COMPLETE 3+ VIEW COMPARISON:  None. FINDINGS: Considerable degenerative changes of the elbow joint are noted particularly in  the articulation of the humerus and ulna. Soft tissue swelling is noted posteriorly consistent with the recent injury. No joint effusion is seen. Large olecranon spurs are seen as well as some findings suggestive of olecranon bursitis. IMPRESSION: No acute fracture is noted. Soft tissue changes are seen consistent with the given clinical history. Electronically Signed   By: Inez Catalina M.D.   On: 12/27/2019 16:16   CT HEAD WO CONTRAST  Result Date: 01/01/2020 CLINICAL DATA:  Cerebellar infarct, coronary artery disease, chronic kidney disease, type II diabetes mellitus, hypertension, increased confusion EXAM: CT HEAD WITHOUT CONTRAST TECHNIQUE: Contiguous axial images were obtained from the base of the skull through the vertex without intravenous  contrast. Sagittal and coronal MPR images reconstructed from axial data set. COMPARISON:  12/31/2019 FINDINGS: Brain: Scattered motion artifacts significantly limit exam despite repeating images, patient unable to follow commands. Generalized atrophy. Stable ventricular morphology. No midline shift or mass effect. No gross hemorrhage, mass or infarct identified within limitations of motion. Vascular: Mild atherosclerotic calcifications of internal carotid arteries at skull base Skull: No gross abnormality seen Sinuses/Orbits: Small mucosal retention cyst and mild mucosal thickening in sphenoid sinus Other: N/A IMPRESSION: Exam severely limited by patient motion artifacts. Generalized atrophy without definite acute intracranial abnormalities as above. Electronically Signed   By: Lavonia Dana M.D.   On: 01/01/2020 10:08   CT Head Wo Contrast  Result Date: 12/17/2019 CLINICAL DATA:  Fall. EXAM: CT HEAD WITHOUT CONTRAST TECHNIQUE: Contiguous axial images were obtained from the base of the skull through the vertex without intravenous contrast. COMPARISON:  None. FINDINGS: Brain: No evidence of acute infarction, hemorrhage, hydrocephalus, extra-axial collection or mass lesion/mass effect. Small remote bilateral cerebellar infarcts. Mild brain atrophy. Vascular: Atherosclerotic calcification Skull: Right forehead laceration.  No calvarial fracture. Sinuses/Orbits: No evidence of injury IMPRESSION: 1. No evidence of intracranial injury. 2. Right forehead laceration without fracture. Electronically Signed   By: [place] Fantasia M.D.   On: 12/17/2019 04:30   CT ANGIO NECK W OR WO CONTRAST  Result Date: 12/31/2019 CLINICAL DATA:  Stroke, follow-up EXAM: CT ANGIOGRAPHY HEAD AND NECK TECHNIQUE: Multidetector CT imaging of the head and neck was performed using the standard protocol during bolus administration of intravenous contrast. Multiplanar CT image reconstructions and MIPs were obtained to evaluate the vascular  anatomy. Carotid stenosis measurements (when applicable) are obtained utilizing NASCET criteria, using the distal internal carotid diameter as the denominator. CONTRAST:  87mL OMNIPAQUE IOHEXOL 350 MG/ML SOLN COMPARISON:  12/17/2019 head CT, 12/29/2019 MRI FINDINGS: CT HEAD FINDINGS Brain: There is no acute intracranial hemorrhage. Small chronic cerebellar infarcts identified. More recent small right cerebellar infarct is better seen on MRI. Gray-white differentiation is preserved. Ventricles are stable in size. No extra-axial collection. Vascular: There is mild intracranial atherosclerotic calcification at the skull base. Skull: Unremarkable. Sinuses: Mild mucosal thickening. Orbits: Orbits are unremarkable. Review of the MIP images confirms the above findings CTA NECK FINDINGS Aortic arch: Mild calcified plaque. Great vessel origins are patent. Right carotid system: Patent. Mild calcified plaque at the ICA origin without measurable stenosis. Left carotid system: Patent. No measurable stenosis at the ICA origin. Vertebral arteries: Patent and codominant. Calcified plaque is present at the left vertebral artery origin with mild stenosis. Skeleton: Multilevel degenerative changes of the cervical spine. There are bulky anterior bridging osteophytes with indentation of the posterior pharynx and esophagus. Other neck: No neck mass or adenopathy. Upper chest: No apical lung mass. Review of the MIP images confirms the above findings  CTA HEAD FINDINGS Anterior circulation: Intracranial internal carotid arteries patent with calcified plaque causing mild stenosis. Anterior and middle cerebral arteries are patent. Posterior circulation: Intracranial vertebral arteries are patent. Posterior inferior cerebellar arteries are patent. Basilar artery is patent. Superior cerebellar arteries are patent. Posterior cerebral arteries are patent. Venous sinuses: As permitted by contrast timing, patent. Review of the MIP images confirms  the above findings IMPRESSION: No acute intracranial hemorrhage or new loss of gray-white differentiation. Recent small cerebellar infarct is better seen on MRI. No hemodynamically significant stenosis or evidence of dissection. Electronically Signed   By: Macy Mis M.D.   On: 12/31/2019 13:31   CT Cervical Spine Wo Contrast  Addendum Date: 12/17/2019   ADDENDUM REPORT: 12/17/2019 09:36 ADDENDUM: Correction to impression #2, the second sentence should read in the upper CERVICAL levels there is also posterior longitudinal ligament ossification. Electronically Signed   By: [place] Fantasia M.D.   On: 12/17/2019 09:36   Result Date: 12/17/2019 CLINICAL DATA:  Fall with laceration. EXAM: CT CERVICAL SPINE WITHOUT CONTRAST TECHNIQUE: Multidetector CT imaging of the cervical spine was performed without intravenous contrast. Multiplanar CT image reconstructions were also generated. COMPARISON:  None. FINDINGS: Alignment: Normal Skull base and vertebrae: Negative for fracture. 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 There is ankylosis from the occiput to T2. 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 bone island in the C2 body. Soft tissues and spinal canal: No prevertebral fluid or swelling. No visible canal hematoma. Lipoma in the posterior left para median neck the even more simple internal architecture than adjacent fat, up to 3 cm on axial slices. Disc levels:  Spondylitic changes as noted above. Upper chest: Negative IMPRESSION: 1. No acute finding. 2. 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 In the upper thoracic levels there is also posterior longitudinal ligament ossification. Electronically Signed: By: [place] Fantasia M.D. On: 12/17/2019 09:04   MR BRAIN WO CONTRAST  Result Date: 12/29/2019 CLINICAL DATA:  Acute neuro deficit, stroke suspected. Left leg symptoms. Recent fall  sustaining a right femoral neck fracture treated with arthroplasty. EXAM: MRI HEAD WITHOUT CONTRAST TECHNIQUE: Multiplanar, multiecho pulse sequences of the brain and surrounding structures were obtained without intravenous contrast. COMPARISON:  Head CT 12/17/2019 FINDINGS: Brain: There is a 6 mm acute right cerebellar infarct. Small chronic infarcts are present in the cerebellum bilaterally. No intracranial hemorrhage, mass, midline shift, or extra-axial fluid collection is identified. There is moderate cerebral atrophy. A few small foci of T2 hyperintensity in the cerebral white matter are nonspecific and not considered abnormal for age. Vascular: Major intracranial vascular flow voids are preserved. Skull and upper cervical spine: Unremarkable bone marrow signal. Prominent ligamentous thickening posterior to the dens without significant mass effect on the cervicomedullary junction. Sinuses/Orbits: Unremarkable orbits. Small right maxillary sinus mucous retention cyst. Mild scattered mucosal thickening in the paranasal sinuses, greatest in the left sphenoid sinus. Clear mastoid air cells. Other: None. IMPRESSION: 1. Small acute right cerebellar infarct. 2. Chronic bilateral cerebellar infarcts. Electronically Signed   By: Logan Bores M.D.   On: 12/29/2019 11:48   CT ABDOMEN PELVIS W CONTRAST  Result Date: 12/28/2019 CLINICAL DATA:  Abdominal trauma with gross hematuria. EXAM: CT ABDOMEN AND PELVIS WITH CONTRAST TECHNIQUE: Multidetector CT imaging of the abdomen and pelvis was performed using the standard protocol following bolus administration of intravenous contrast. CONTRAST:  84mL OMNIPAQUE IOHEXOL 300 MG/ML  SOLN COMPARISON:  None. FINDINGS: LOWER CHEST: No basilar pleural or apical pericardial effusion. HEPATOBILIARY: Normal hepatic contours. No intra- or extrahepatic biliary dilatation. Status post cholecystectomy. PANCREAS: Normal pancreas. No ductal dilatation or peripancreatic fluid collection.  SPLEEN: Normal. ADRENALS/URINARY TRACT: The adrenal glands are normal. There is right-sided retroperitoneal stranding tracking from the right flank along the course of the right ureter, most evident at the level of the right common iliac bifurcation. The left ureter is normal. There is calcification and scarring of the left kidney upper pole. The urinary bladder is normal for degree of distention. There is a Foley catheter with the balloon inflated within the proximal penile urethra. STOMACH/BOWEL: There is no hiatal hernia. Normal duodenal course and caliber. No small bowel dilatation or inflammation. No focal colonic abnormality. Surgically absent. VASCULAR/LYMPHATIC: There is calcific atherosclerosis of the abdominal aorta. No abdominal or pelvic lymphadenopathy. REPRODUCTIVE: Markedly enlarged prostate measures 6.9 x 5.9 x 6.0 cm. MUSCULOSKELETAL. There is a comminuted fracture of the right femoral neck. There are bridging osteophytes of both sacroiliac joints. There are anterior lumbar vertebral enthesophytes and lower thoracic spinous process enthesophytes. There is moderate-to-severe foraminal stenosis bilaterally at L4-5 and L5-S1. OTHER: None. IMPRESSION: 1. Right-sided retroperitoneal stranding tracking from the right flank along the course of the right ureter, most evident at the level of the right common iliac bifurcation. Given the recent fall, this might be a sequela of trauma. Ascending urinary tract infection might also cause this appearance. 2. Foley catheter balloon is inflated within the proximal penile urethra. Removal is recommended. 3. Comminuted fracture of the right femoral neck. 4. Markedly enlarged prostate. 5. Aortic Atherosclerosis (ICD10-I70.0). Electronically Signed   By: Ulyses Jarred M.D.   On: 12/28/2019 03:42   US RENAL  Result Date: 12/30/2019 CLINICAL DATA:  Hematuria EXAM: RENAL / URINARY TRACT ULTRASOUND COMPLETE COMPARISON:  None. FINDINGS: Right Kidney: Renal measurements:  12 x 4.2 x 5.0 cm = volume: 134 mL. Mildly echogenic cortex. Left Kidney: Renal measurements: 10.9 x 4.5 x 4.9 cm = volume: 128 mL. Limited evaluation. Increased cortical echogenicity in a mildly nodular contour. Bladder: Decompressed with a Foley catheter. Other: None. IMPRESSION: 1. Mildly echogenic cortex bilaterally suggesting the possibility medical renal disease. Evaluation of the left kidney is limited. 2. The bladder is decompressed with a Foley catheter. Electronically Signed   By: Dorise Bullion III M.D   On: 12/30/2019 12:19   US Carotid Bilateral (at [facility] and [facility] only)  Result Date: 12/29/2019 CLINICAL DATA:  Acute cerebellar infarct EXAM: BILATERAL CAROTID DUPLEX ULTRASOUND TECHNIQUE: Pearline Cables scale imaging, color Doppler and duplex ultrasound were performed of bilateral carotid and vertebral arteries in the neck. COMPARISON:  12/29/2019 FINDINGS: Criteria: Quantification of carotid stenosis is based on velocity parameters that correlate the residual internal carotid diameter with NASCET-based stenosis levels, using the diameter of the distal internal carotid lumen as the denominator for stenosis measurement. The following velocity measurements were obtained: RIGHT ICA: 101/15 cm/sec CCA: 0000000 cm/sec SYSTOLIC ICA/CCA RATIO:  1.3 ECA: 133 cm/sec LEFT ICA: 98/17 cm/sec CCA: 123456 cm/sec SYSTOLIC ICA/CCA RATIO:  1.0 ECA: 127 cm/sec RIGHT CAROTID ARTERY: Minor echogenic shadowing plaque formation. No hemodynamically significant right ICA stenosis, velocity elevation, or turbulent flow. Degree of narrowing less than 50%. RIGHT VERTEBRAL ARTERY:  Antegrade LEFT CAROTID ARTERY: Similar scattered minor echogenic plaque formation. No hemodynamically significant left ICA stenosis, velocity elevation, or turbulent flow. LEFT VERTEBRAL ARTERY:  Antegrade IMPRESSION: Minor carotid atherosclerosis. No hemodynamically significant ICA stenosis. Degree of narrowing less than  50% bilaterally by ultrasound criteria. Patent  antegrade vertebral flow bilaterally Electronically Signed   By: Jerilynn Mages.  Shick M.D.   On: 12/29/2019 13:55   DG Chest Port 1 View  Result Date: 01/02/2020 CLINICAL DATA:  Shortness of breath. History of coronary artery disease, diabetes and hypertension. EXAM: PORTABLE CHEST 1 VIEW COMPARISON:  Radiographs 12/27/2019 and 01/01/2020. FINDINGS: 1314 hours. The heart size and mediastinal contours are stable. There is aortic and coronary artery atherosclerosis. There is interval improved aeration of the lung bases with mild residual atelectasis. No edema, confluent airspace opacity, pneumothorax or significant pleural effusion. The bones appear unchanged. IMPRESSION: Interval improved aeration of the lung bases with mild residual atelectasis. No acute findings. Electronically Signed   By: Richardean Sale M.D.   On: 01/02/2020 14:16   DG Chest Port 1 View  Result Date: 01/01/2020 CLINICAL DATA:  Metabolic encephalopathy, acute EXAM: PORTABLE CHEST 1 VIEW COMPARISON:  12/27/2019 FINDINGS: The heart size and mediastinal contours are within normal limits. Both lungs are clear. The visualized skeletal structures are unremarkable. IMPRESSION: No active disease. Electronically Signed   By: Kerby Moors M.D.   On: 01/01/2020 10:10   DG Chest Portable 1 View  Result Date: 12/27/2019 CLINICAL DATA:  Right hip pain, hypertension EXAM: PORTABLE CHEST 1 VIEW COMPARISON:  None. FINDINGS: Heart size is mildly enlarged. Aortic atherosclerosis. Both lungs are clear. The visualized skeletal structures are unremarkable. IMPRESSION: 1. No active cardiopulmonary disease. 2. Mild cardiomegaly. 3. Aortic atherosclerosis. Electronically Signed   By: Davina Poke D.O.   On: 12/27/2019 17:44   DG Knee Left Port  Result Date: 12/28/2019 CLINICAL DATA:  Left knee pain. EXAM: PORTABLE LEFT KNEE - 1-2 VIEW COMPARISON:  None. FINDINGS: No evidence of fracture, dislocation, or joint effusion. Advanced degenerative joint disease  identified with marked medial compartment narrowing, subchondral sclerosis and marginal spur formation. No fractures or dislocations. IMPRESSION: 1. Advanced degenerative joint disease. 2. No acute findings. Electronically Signed   By: Kerby Moors M.D.   On: 12/28/2019 11:28   DG Knee Right Port  Result Date: 12/28/2019 CLINICAL DATA:  Right knee pain. EXAM: PORTABLE RIGHT KNEE - 1-2 VIEW COMPARISON:  None. FINDINGS: No acute fracture is identified on this single AP radiograph. There is severe medial compartment joint space narrowing with mild genu varus deformity. Moderate medial and lateral compartment marginal osteophytosis is noted. There is mild diffuse soft tissue swelling. IMPRESSION: 1. Severe medial compartment osteoarthrosis. 2. No acute osseous abnormality identified. Electronically Signed   By: Logan Bores M.D.   On: 12/28/2019 11:42   ECHOCARDIOGRAM COMPLETE  Result Date: 12/30/2019   ECHOCARDIOGRAM REPORT   Patient Name:   DAVIUS WILLOCK Date of Exam: 12/29/2019 Medical Rec #:  [id number]        Height:       74.0 in Accession #:    [id number]       Weight:       234.0 lb Date of Birth:  14-Mar-1941        BSA:          2.32 m Patient Age:    9 years         BP:           135/56 mmHg Patient Gender: M                HR:           76 bpm. Exam Location:  [facility] Procedure: 2D Echo Indications:  STROKE 434.91/ I163.9  History:         Patient has no prior history of Echocardiogram examinations.  Sonographer:     Arville Go RDCS Referring Phys:  [place] Diagnosing Phys: Bartholome Bill MD IMPRESSIONS  1. Left ventricular ejection fraction, by visual estimation, is 55 to 60%. The left ventricle has normal function. Left ventricular septal wall thickness was mildly increased. There is borderline left ventricular hypertrophy.  2. The left ventricle has no regional wall motion abnormalities.  3. Global right ventricle has normal systolic function.The right ventricular size is normal.  No increase in right ventricular wall thickness.  4. Left atrial size was normal.  5. Right atrial size was normal.  6. The mitral valve is grossly normal. Trivial mitral valve regurgitation.  7. The tricuspid valve is grossly normal.  8. The aortic valve is tricuspid. Aortic valve regurgitation is not visualized.  9. The pulmonic valve was grossly normal. Pulmonic valve regurgitation is not visualized. 10. The atrial septum is grossly normal. FINDINGS  Left Ventricle: Left ventricular ejection fraction, by visual estimation, is 55 to 60%. The left ventricle has normal function. The left ventricle has no regional wall motion abnormalities. There is borderline left ventricular hypertrophy. Right Ventricle: The right ventricular size is normal. No increase in right ventricular wall thickness. Global RV systolic function is has normal systolic function. Left Atrium: Left atrial size was normal in size. Right Atrium: Right atrial size was normal in size Pericardium: There is no evidence of pericardial effusion. Mitral Valve: The mitral valve is grossly normal. Trivial mitral valve regurgitation. Tricuspid Valve: The tricuspid valve is grossly normal. Tricuspid valve regurgitation is mild. Aortic Valve: The aortic valve is tricuspid. Aortic valve regurgitation is not visualized. Aortic valve peak gradient measures 8.6 mmHg. Pulmonic Valve: The pulmonic valve was grossly normal. Pulmonic valve regurgitation is not visualized. Pulmonic regurgitation is not visualized. Aorta: The aortic root is normal in size and structure. IAS/Shunts: The atrial septum is grossly normal.  LEFT VENTRICLE PLAX 2D LVIDd:         4.29 cm  Diastology LVIDs:         3.05 cm  LV e' lateral:   7.29 cm/s LV PW:         1.23 cm  LV E/e' lateral: 9.5 LV IVS:        1.21 cm  LV e' medial:    7.51 cm/s LVOT diam:     2.30 cm  LV E/e' medial:  9.2 LV SV:         46 ml LV SV Index:   19.43 LVOT Area:     4.15 cm  RIGHT VENTRICLE RV Basal diam:  3.09 cm  RV S prime:     11.40 cm/s TAPSE (M-mode): 2.1 cm LEFT ATRIUM           Index      RIGHT ATRIUM           Index LA diam:      2.30 cm 0.99 cm/m RA Area:     15.70 cm LA Vol (A2C): 18.9 ml 8.13 ml/m RA Volume:   41.10 ml  17.69 ml/m LA Vol (A4C): 12.0 ml 5.16 ml/m  AORTIC VALVE                PULMONIC VALVE AV Area (Vmax): 2.46 cm    PV Vmax:       1.03 m/s AV Vmax:  147.00 cm/s PV Peak grad:  4.2 mmHg AV Peak Grad:   8.6 mmHg LVOT Vmax:      87.20 cm/s LVOT Vmean:     57.100 cm/s LVOT VTI:       0.166 m  AORTA Ao Root diam: 3.70 cm Ao Asc diam:  3.30 cm MV E velocity: 69.40 cm/s 103 cm/s  TRICUSPID VALVE MV A velocity: 70.30 cm/s 70.3 cm/s TV Peak grad:   36.2 mmHg MV E/A ratio:  0.99       1.5       TV Vmax:        3.01 m/s                                      SHUNTS                                     Systemic VTI:  0.17 m                                     Systemic Diam: 2.30 cm  Bartholome Bill MD Electronically signed by Bartholome Bill MD Signature Date/Time: 12/30/2019/8:26:01 AM    Final    DG Hip Port Unilat With Pelvis 1V Right  Result Date: 12/28/2019 CLINICAL DATA:  79 year old male with a history of total hip replacement EXAM: DG HIP (WITH OR WITHOUT PELVIS) 1V PORT RIGHT COMPARISON:  12/27/2019 FINDINGS: Interval surgical changes of right hip arthroplasty. Gas and swelling at the surgical bed. Surgical staples project within the soft tissues. Urinary catheter in position. Left hip with degenerative changes.  Bony pelvic ring intact. IMPRESSION: Early surgical changes of right hip arthroplasty without complicating features. Urinary catheter. Electronically Signed   By: Corrie Mckusick D.O.   On: 12/28/2019 11:30   DG Hip Unilat W or Wo Pelvis 2-3 Views Right  Result Date: 12/27/2019 CLINICAL DATA:  Fall. EXAM: DG HIP (WITH OR WITHOUT PELVIS) 2-3V RIGHT COMPARISON:  No recent. FINDINGS: Diffuse severe osteopenia and degenerative change. Angulated fracture of the right femoral neck is noted. No  evidence of dislocation. IMPRESSION: 1.  Angulated right femoral neck fracture. 2.  Diffuse severe osteopenia degenerative change. Electronically Signed   By: Marcello Moores  Register   On: 12/27/2019 16:16    Time Spent in minutes 35   Berle Mull M.D on 01/02/2020 at 7:01 PM

## 2020-01-02 NOTE — Progress Notes (Signed)
Physical Therapy Treatment Patient Details Name: Grant Mcdaniel MRN: [id number] DOB: 09-08-1941 Today's Date: 01/02/2020    History of Present Illness Patient is a 79 year old male who was admitted to [facility] for a R hip hemiarthroplasty secondary to a right femoral neck fracture. Pt. had a cerbellar infarct. Pt. PMHx includes: CAD, CKD, DM with CKD stage III, Hypercholesterolemia, HTN, hypothyroid, PVD, and falls.    PT Comments    Pt very fatigued from being in recliner, requesting to return back to bed. Heavy +2 assist required for ambulation back to bed with uncontrolled descent to bed due to sudden heavy post leaning/extension; pt became rigid and couldn't come to sit on bed. Minimal there-ex performed due extensor tone and fatigue. Only able to report 2/3 hip precautions. Discussed poor progress with ortho MD and hospital doctor. Also condom cath leaking, tried to call NA twice and walked around unit and she was not around. Placed towel to protect skin from wetness. Will continue to progress as able.   Follow Up Recommendations  SNF     Equipment Recommendations  Rolling walker with 5" wheels;3in1 (PT)    Recommendations for Other Services       Precautions / Restrictions Precautions Precautions: Posterior Hip;Fall Precaution Booklet Issued: Yes (comment) Precaution Comments: WBAT Restrictions Weight Bearing Restrictions: Yes RLE Weight Bearing: Weight bearing as tolerated Other Position/Activity Restrictions: New CVA following Hip surgery    Mobility  Bed Mobility Overal bed mobility: Needs Assistance Bed Mobility: Supine to Sit     Supine to sit: Max assist;Mod assist;+2 for physical assistance Sit to supine: Total assist;+2 for physical assistance   General bed mobility comments: Pt needs total assist this session after decreased controlled descent to bed almost falling in the floor. Pt then needs total assist to scoot up towards [facility] and repositioning. Very high  extensor tone even present in bed. Keeps rigid posture and has difficulty flexing to reposition.  Transfers Overall transfer level: Needs assistance Equipment used: Rolling walker (2 wheeled) Transfers: Sit to/from Stand Sit to Stand: +2 physical assistance;Max assist         General transfer comment: 1st attempt, has difficulty keeping feet under him as he is pushing back into extension despite cues for flexion to achieve transfer success. 2nd person called and with max assist +2 and B foot blocking, able to stand with upright posture.   Ambulation/Gait Ambulation/Gait assistance: Max assist;+2 physical assistance Gait Distance (Feet): 2 Feet Assistive device: Rolling walker (2 wheeled) Gait Pattern/deviations: Step-to pattern     General Gait Details: able to follow commands to transfer to bed, however fatigued very quickly and tries to sit unexpecantly with therapist and tech using total assist to land pt on bed with legs hanging off EOB and head off edge of bed. +2 total assist for repositioning back correctly. Unable to perform further ambulation   Stairs             Wheelchair Mobility    Modified Rankin (Stroke Patients Only)       Balance Overall balance assessment: Needs assistance Sitting-balance support: Feet unsupported;No upper extremity supported Sitting balance-Leahy Scale: Zero Sitting balance - Comments: heavy post leaning Postural control: Left lateral lean Standing balance support: During functional activity;Bilateral upper extremity supported Standing balance-Leahy Scale: Fair Standing balance comment: Mild posterior lean with standing. Heavy BUE support on RW.  Cognition Arousal/Alertness: Awake/alert Behavior During Therapy: WFL for tasks assessed/performed Overall Cognitive Status: Within Functional Limits for tasks assessed                                 General Comments: increased  tremors      Exercises Other Exercises Other Exercises: Pt educated in falls prevention strategies for home and hospital, compression stocking management strategies, posterior THPs and implications for ADL management, and safe use of AE for LB ADL management and adherence to posterior THPs. Other Exercises: Pt performs toilet transfer to [facility] with +2 from OT/PT this date. Requires max assist for peri-care with OT assisting with hygiene and PT providing physical support for balance/safety. Other Exercises: seated ther-ex performed including B LE LAQ and supine B LE hip abd/add. 10 reps with mod/max assist    General Comments        Pertinent Vitals/Pain Pain Assessment: No/denies pain    Home Living                      Prior Function            PT Goals (current goals can now be found in the care plan section) Acute Rehab PT Goals Patient Stated Goal: To regain independence PT Goal Formulation: With patient Time For Goal Achievement: 01/12/20 Potential to Achieve Goals: Fair Progress towards PT goals: Progressing toward goals    Frequency    BID      PT Plan Current plan remains appropriate    Co-evaluation PT/OT/SLP Co-Evaluation/Treatment: Yes Reason for Co-Treatment: To address functional/ADL transfers;Complexity of the patient's impairments (multi-system involvement);For patient/therapist safety PT goals addressed during session: Balance;Strengthening/ROM;Mobility/safety with mobility;Proper use of DME OT goals addressed during session: ADL's and self-care;Proper use of Adaptive equipment and DME      AM-PAC PT "6 Clicks" Mobility   Outcome Measure  Help needed turning from your back to your side while in a flat bed without using bedrails?: Total Help needed moving from lying on your back to sitting on the side of a flat bed without using bedrails?: Total Help needed moving to and from a bed to a chair (including a wheelchair)?: Total Help needed  standing up from a chair using your arms (e.g., wheelchair or bedside chair)?: Total Help needed to walk in hospital room?: Total Help needed climbing 3-5 steps with a railing? : Total 6 Click Score: 6    End of Session Equipment Utilized During Treatment: Gait belt Activity Tolerance: Patient limited by fatigue Patient left: in bed;with bed alarm set Nurse Communication: Mobility status PT Visit Diagnosis: Unsteadiness on feet (R26.81);Other abnormalities of gait and mobility (R26.89);Muscle weakness (generalized) (M62.81);History of falling (Z91.81);Difficulty in walking, not elsewhere classified (R26.2)     Time: [id number] PT Time Calculation (min) (ACUTE ONLY): 28 min  Charges:  $Gait Training: 8-22 mins $Therapeutic Exercise: 8-22 mins                     Greggory Stallion, PT, DPT [phone]    Oleta Gunnoe 01/02/2020, 3:14 PM

## 2020-01-03 LAB — BLOOD CULTURE ID PANEL (REFLEXED)

## 2020-01-03 LAB — GLUCOSE, CAPILLARY
Glucose-Capillary: 118 mg/dL — ABNORMAL HIGH (ref 70–99)
Glucose-Capillary: 170 mg/dL — ABNORMAL HIGH (ref 70–99)
Glucose-Capillary: 119 mg/dL — ABNORMAL HIGH (ref 70–99)

## 2020-01-03 LAB — CBC WITH DIFFERENTIAL/PLATELET
Abs Immature Granulocytes: 3.35 10*3/uL — ABNORMAL HIGH (ref 0.00–0.07)
Basophils Absolute: 0.1 10*3/uL (ref 0.0–0.1)
Basophils Relative: 0 %
Eosinophils Absolute: 0 10*3/uL (ref 0.0–0.5)
Eosinophils Relative: 0 %
HCT: 23.2 % — ABNORMAL LOW (ref 39.0–52.0)
Hemoglobin: 7.8 g/dL — ABNORMAL LOW (ref 13.0–17.0)
Immature Granulocytes: 13 %
Lymphocytes Relative: 2 %
Lymphs Abs: 0.4 10*3/uL — ABNORMAL LOW (ref 0.7–4.0)
MCH: 29 pg (ref 26.0–34.0)
MCHC: 33.6 g/dL (ref 30.0–36.0)
MCV: 86.2 fL (ref 80.0–100.0)
Monocytes Absolute: 0.5 10*3/uL (ref 0.1–1.0)
Monocytes Relative: 2 %
Neutro Abs: 22.3 10*3/uL — ABNORMAL HIGH (ref 1.7–7.7)
Neutrophils Relative %: 83 %
Platelets: 155 10*3/uL (ref 150–400)
RBC: 2.69 MIL/uL — ABNORMAL LOW (ref 4.22–5.81)
RDW: 14.3 % (ref 11.5–15.5)
Smear Review: NORMAL
WBC: 26.6 10*3/uL — ABNORMAL HIGH (ref 4.0–10.5)
nRBC: 0 % (ref 0.0–0.2)

## 2020-01-03 LAB — PROCALCITONIN: Procalcitonin: >150 ng/mL

## 2020-01-03 LAB — COMPREHENSIVE METABOLIC PANEL
ALT: 35 U/L (ref 0–44)
AST: 53 U/L — ABNORMAL HIGH (ref 15–41)
Albumin: 2.6 g/dL — ABNORMAL LOW (ref 3.5–5.0)
Alkaline Phosphatase: 191 U/L — ABNORMAL HIGH (ref 38–126)
Anion gap: 11 (ref 5–15)
BUN: 61 mg/dL — ABNORMAL HIGH (ref 8–23)
CO2: 23 mmol/L (ref 22–32)
Calcium: 9.2 mg/dL (ref 8.9–10.3)
Chloride: 100 mmol/L (ref 98–111)
Creatinine, Ser: 1.72 mg/dL — ABNORMAL HIGH (ref 0.61–1.24)
GFR calc Af Amer: 43 mL/min — ABNORMAL LOW (ref >60)
GFR calc non Af Amer: 38 mL/min — ABNORMAL LOW (ref >60)
Glucose, Bld: 132 mg/dL — ABNORMAL HIGH (ref 70–99)
Potassium: 3.5 mmol/L (ref 3.5–5.1)
Sodium: 134 mmol/L — ABNORMAL LOW (ref 135–145)
Total Bilirubin: 3.8 mg/dL — ABNORMAL HIGH (ref 0.3–1.2)
Total Protein: 5.7 g/dL — ABNORMAL LOW (ref 6.5–8.1)

## 2020-01-03 LAB — MAGNESIUM: Magnesium: 1.8 mg/dL (ref 1.7–2.4)

## 2020-01-03 MED ORDER — ASPIRIN EC 81 MG PO TBEC
81.0000 mg | DELAYED_RELEASE_TABLET | Freq: Every day | ORAL | Status: DC
  Administered 2020-01-04 – 2020-01-06 (×3): 81 mg via ORAL
  Filled 2020-01-03 (×3): qty 1

## 2020-01-03 MED ORDER — SODIUM CHLORIDE 0.9 % IV SOLN
2.0000 g | Freq: Two times a day (BID) | INTRAVENOUS | Status: DC
  Administered 2020-01-03 – 2020-01-04 (×3): 2 g via INTRAVENOUS
  Filled 2020-01-03 (×6): qty 2

## 2020-01-03 MED ORDER — SODIUM CHLORIDE 0.9 % IV SOLN: 2000.0000 mg | INTRAVENOUS | Status: DC

## 2020-01-03 MED ORDER — SODIUM CHLORIDE 0.9 % IV SOLN
2.0000 g | INTRAVENOUS | Status: DC
  Administered 2020-01-03: 2 g via INTRAVENOUS
  Filled 2020-01-03: qty 2

## 2020-01-03 NOTE — Progress Notes (Signed)
PHARMACY - PHYSICIAN COMMUNICATION CRITICAL VALUE ALERT - BLOOD CULTURE IDENTIFICATION (BCID)  Grant Mcdaniel is an 79 y.o. male who presented to [facility] on 12/27/2019 with a chief complaint of fall  Assessment:  Tmax 101.5F, WBC 39 >> 37 >> 26.6, 2/4 GNR Enterobacteriaceae Proteus.  Name of physician (or Provider) Contacted: Sharion Settler  Current antibiotics: vanc/cefepime  Changes to prescribed antibiotics recommended:  Recommendations accepted by provider -- will discontinue vanc/cefepime and start ceftriaxone 2g IV daily.  Results for orders placed or performed during the hospital encounter of 12/27/19  Blood Culture ID Panel (Reflexed) (Collected: 01/02/2020  1:38 PM)  Result Value Ref Range   Enterococcus species NOT DETECTED NOT DETECTED   Listeria monocytogenes NOT DETECTED NOT DETECTED   Staphylococcus species NOT DETECTED NOT DETECTED   Staphylococcus aureus (BCID) NOT DETECTED NOT DETECTED   Streptococcus species NOT DETECTED NOT DETECTED   Streptococcus agalactiae NOT DETECTED NOT DETECTED   Streptococcus pneumoniae NOT DETECTED NOT DETECTED   Streptococcus pyogenes NOT DETECTED NOT DETECTED   Acinetobacter baumannii NOT DETECTED NOT DETECTED   Enterobacteriaceae species DETECTED (A) NOT DETECTED   Enterobacter cloacae complex NOT DETECTED NOT DETECTED   Escherichia coli NOT DETECTED NOT DETECTED   Klebsiella oxytoca NOT DETECTED NOT DETECTED   Klebsiella pneumoniae NOT DETECTED NOT DETECTED   Proteus species DETECTED (A) NOT DETECTED   Serratia marcescens NOT DETECTED NOT DETECTED   Carbapenem resistance NOT DETECTED NOT DETECTED   Haemophilus influenzae NOT DETECTED NOT DETECTED   Neisseria meningitidis NOT DETECTED NOT DETECTED   Pseudomonas aeruginosa NOT DETECTED NOT DETECTED   Candida albicans NOT DETECTED NOT DETECTED   Candida glabrata NOT DETECTED NOT DETECTED   Candida krusei NOT DETECTED NOT DETECTED   Candida parapsilosis NOT DETECTED NOT DETECTED    Candida tropicalis NOT DETECTED NOT DETECTED    Tobie Lords, PharmD, BCPS Clinical Pharmacist 01/03/2020  4:55 AM

## 2020-01-03 NOTE — TOC Progression Note (Signed)
Transition of Care [facility]) - Progression Note    Patient Details  Name: Grant Mcdaniel MRN: [id number] Date of Birth: October 22, 1941  [facility] of [facility] CM/SW Contact  Jaquay Posthumus, Gardiner Rhyme, LCSW Phone Number: 01/03/2020, 2:53 PM  Clinical Narrative:   Aida Puffer from [place] [id number] approved for four days once is transferred to facility. Will contact once ready to go to facility.    Expected Discharge Plan: Skilled Nursing Facility Barriers to Discharge: Ship broker, Continued Medical Work up  Expected Discharge Plan and Services Expected Discharge Plan: [facility] In-house Referral: Clinical Social Work Discharge Planning Services: NA   Living arrangements for the past 2 months: Single Family Home                                       Social Determinants of Health (SDOH) Interventions    Readmission Risk Interventions No flowsheet data found.

## 2020-01-03 NOTE — Care Management Important Message (Signed)
Important Message  Patient Details  Name: Grant Mcdaniel MRN: [id number] Date of Birth: December 29, 1940   Medicare Important Message Given:  Yes     Juliann Pulse A Hendrix Yurkovich 01/03/2020, 10:51 AM

## 2020-01-03 NOTE — Progress Notes (Signed)
Physical Therapy Treatment Patient Details Name: Grant Mcdaniel MRN: [id number] DOB: [birth year] Today's Date: 01/03/2020    History of Present Illness Patient is a 79 year old male who was admitted to [facility] for a R hip hemiarthroplasty secondary to a right femoral neck fracture. Pt. had a cerbellar infarct. Pt. PMHx includes: CAD, CKD, DM with CKD stage III, Hypercholesterolemia, HTN, hypothyroid, PVD, and falls.    PT Comments    Pt is making improved progress towards goals with ability to demonstrate improved balance this date. Still have +2 for safety/equipment. Fatigues and requests to return to bed from sitting in recliner. Good endurance with HEP, will continue to progress as able.   Follow Up Recommendations  SNF     Equipment Recommendations  Rolling walker with 5" wheels;3in1 (PT)    Recommendations for Other Services       Precautions / Restrictions Precautions Precautions: Posterior Hip;Fall Precaution Booklet Issued: Yes (comment) Precaution Comments: WBAT Restrictions Weight Bearing Restrictions: Yes RLE Weight Bearing: Weight bearing as tolerated Other Position/Activity Restrictions: New CVA following Hip surgery    Mobility  Bed Mobility Overal bed mobility: Needs Assistance Bed Mobility: Sit to Supine       Sit to supine: Mod assist;+2 for safety/equipment   General bed mobility comments: needs assist for return back to bed including positioning. Pt doesn't realize when he is asymmetrical in bed; needs assist for repositioning  Transfers Overall transfer level: Needs assistance Equipment used: Rolling walker (2 wheeled) Transfers: Sit to/from Stand Sit to Stand: Mod assist         General transfer comment: Safe technique, required blocking B feet prior to standing. Cues for hand placement.  Ambulation/Gait Ambulation/Gait assistance: Min assist;+2 safety/equipment Gait Distance (Feet): 5 Feet Assistive device: Rolling walker (2 wheeled) Gait  Pattern/deviations: Step-through pattern     General Gait Details: ambulated to bed, fatigues quickly. Safe technique with RW   Stairs             Wheelchair Mobility    Modified Rankin (Stroke Patients Only)       Balance Overall balance assessment: Needs assistance Sitting-balance support: Feet unsupported;No upper extremity supported Sitting balance-Leahy Scale: Good Sitting balance - Comments: upright posture   Standing balance support: Bilateral upper extremity supported Standing balance-Leahy Scale: Fair                              Cognition Arousal/Alertness: Awake/alert Behavior During Therapy: WFL for tasks assessed/performed Overall Cognitive Status: Within Functional Limits for tasks assessed                                        Exercises Other Exercises Other Exercises: supine ther-ex performed including B LE SAQ, AP, quad sets, and hip abd/add. 12 reps with cga.    General Comments        Pertinent Vitals/Pain Pain Assessment: No/denies pain    Home Living                      Prior Function            PT Goals (current goals can now be found in the care plan section) Acute Rehab PT Goals Patient Stated Goal: To regain independence PT Goal Formulation: With patient Time For Goal Achievement: 01/12/20 Potential to Achieve Goals: Fair Progress  towards PT goals: Progressing toward goals    Frequency    BID      PT Plan Current plan remains appropriate    Co-evaluation              AM-PAC PT "6 Clicks" Mobility   Outcome Measure  Help needed turning from your back to your side while in a flat bed without using bedrails?: A Lot Help needed moving from lying on your back to sitting on the side of a flat bed without using bedrails?: A Lot Help needed moving to and from a bed to a chair (including a wheelchair)?: A Little Help needed standing up from a chair using your arms (e.g.,  wheelchair or bedside chair)?: A Little Help needed to walk in hospital room?: A Little Help needed climbing 3-5 steps with a railing? : A Lot 6 Click Score: 15    End of Session Equipment Utilized During Treatment: Gait belt Activity Tolerance: Patient tolerated treatment well Patient left: in bed;with bed alarm set Nurse Communication: Mobility status PT Visit Diagnosis: Unsteadiness on feet (R26.81);Other abnormalities of gait and mobility (R26.89);Muscle weakness (generalized) (M62.81);History of falling (Z91.81);Difficulty in walking, not elsewhere classified (R26.2)     Time: [id number] PT Time Calculation (min) (ACUTE ONLY): 23 min  Charges:  $Gait Training: 8-22 mins $Therapeutic Exercise: 8-22 mins                     Greggory Stallion, PT, DPT [phone]    Sharalee Witman 01/03/2020, 4:35 PM

## 2020-01-03 NOTE — Progress Notes (Addendum)
Urology Inpatient Progress Note  Subjective: Grant Mcdaniel is a 79 y.o. male admitted on 12/27/2019 with hip fracture now POD 6 from right hip hemiarthroplasty who experienced traumatic Foley placement in advance of surgery with balloon inflated into the prostate, subsequently developing significant gross hematuria.  Foley catheter removed 12/31/2019 with subsequent placement of a condom catheter.  Patient has been able to void spontaneously and he was elected to not undergo replacement of Foley catheter.  Hospitalization recently complicated by significant leukocytosis with blood cultures positive for Proteus species, urine culture pending.  On antibiotics as below.  WBC count down today, 26.6.  Hemoglobin down today, 7.8.  Condom cath in place draining clear, yellow urine.  He denies pain today, no acute concerns.  Anti-infectives: Anti-infectives (From admission, onward)   Start     Dose/Rate Route Frequency Ordered Stop   01/03/20 0800  ceFEPIme (MAXIPIME) 2 g in sodium chloride 0.9 % 100 mL IVPB     2 g 200 mL/hr over 30 Minutes Intravenous Every 12 hours 01/03/20 0728     01/03/20 0500  cefTRIAXone (ROCEPHIN) 2 g in sodium chloride 0.9 % 100 mL IVPB  Status:  Discontinued     2 g 200 mL/hr over 30 Minutes Intravenous Every 24 hours 01/03/20 0454 01/03/20 0720   01/03/20 0445  cefTRIAXone (ROCEPHIN) 2,000 mg in sodium chloride 0.9 % 100 mL IVPB  Status:  Discontinued     2,000 mg 200 mL/hr over 30 Minutes Intravenous Every 24 hours 01/03/20 0446 01/03/20 0454   01/02/20 1700  vancomycin (VANCOREADY) IVPB 2000 mg/400 mL     2,000 mg 200 mL/hr over 120 Minutes Intravenous  Once 01/02/20 1536 01/02/20 1932   01/02/20 1700  ceFEPIme (MAXIPIME) 2 g in sodium chloride 0.9 % 100 mL IVPB  Status:  Discontinued     2 g 200 mL/hr over 30 Minutes Intravenous Every 12 hours 01/02/20 1536 01/03/20 0444   01/02/20 1548  vancomycin variable dose per unstable renal function (pharmacist dosing)   Status:  Discontinued      Does not apply See admin instructions 01/02/20 1548 01/03/20 0454   12/28/19 1400  ceFAZolin (ANCEF) IVPB 1 g/50 mL premix     1 g 100 mL/hr over 30 Minutes Intravenous Every 6 hours 12/28/19 1151 12/29/19 1900   12/28/19 0747  ceFAZolin (ANCEF) 2-4 GM/100ML-% IVPB    Note to Pharmacy: Norton Blizzard  : cabinet override      12/28/19 0747 12/28/19 0827   12/27/19 2019  ceFAZolin (ANCEF) IVPB 2g/100 mL premix     2 g 200 mL/hr over 30 Minutes Intravenous 30 min pre-op 12/27/19 2019 12/28/19 0835      Current Facility-Administered Medications  Medication Dose Route Frequency Provider Last Rate Last Admin  .  stroke: mapping our early stages of recovery book   Does not apply Once Dhungel, Nishant, MD      . acetaminophen (TYLENOL) tablet 325-650 mg  325-650 mg Oral Q6H PRN Thornton Park, MD   650 mg at 01/02/20 1521  . alum & mag hydroxide-simeth (MAALOX/MYLANTA) 200-200-20 MG/5ML suspension 30 mL  30 mL Oral Q4H PRN Thornton Park, MD      . atorvastatin (LIPITOR) tablet 40 mg  40 mg Oral q1800 Thornton Park, MD   40 mg at 01/02/20 1724  . bisacodyl (DULCOLAX) suppository 10 mg  10 mg Rectal Daily PRN Thornton Park, MD      . ceFEPIme (MAXIPIME) 2 g in sodium chloride 0.9 %  100 mL IVPB  2 g Intravenous Q12H Hallaji, Dani Gobble, RPH      . Chlorhexidine Gluconate Cloth 2 % PADS 6 each  6 each Topical Daily Loletha Grayer, MD   6 each at 12/31/19 1000  . docusate sodium (COLACE) capsule 100 mg  100 mg Oral BID Thornton Park, MD   100 mg at 01/03/20 0816  . enoxaparin (LOVENOX) injection 40 mg  40 mg Subcutaneous Q24H Lavina Hamman, MD   40 mg at 01/02/20 2213  . fluticasone (FLONASE) 50 MCG/ACT nasal spray 1 spray  1 spray Each Nare Daily Thornton Park, MD   1 spray at 01/03/20 0817  . HYDROcodone-acetaminophen (NORCO) 7.5-325 MG per tablet 1-2 tablet  1-2 tablet Oral Q4H PRN Thornton Park, MD      . HYDROcodone-acetaminophen (NORCO/VICODIN)  5-325 MG per tablet 1-2 tablet  1-2 tablet Oral Q4H PRN Thornton Park, MD   1 tablet at 12/31/19 1719  . insulin aspart (novoLOG) injection 0-5 Units  0-5 Units Subcutaneous QHS Thornton Park, MD      . insulin aspart (novoLOG) injection 0-9 Units  0-9 Units Subcutaneous TID WC Thornton Park, MD   1 Units at 01/02/20 1724  . lactated ringers infusion   Intravenous Continuous Lavina Hamman, MD 100 mL/hr at 01/03/20 0624 New Bag at 01/03/20 [id number]  . levothyroxine (SYNTHROID) tablet 75 mcg  75 mcg Oral Q0600 Thornton Park, MD   75 mcg at 01/03/20 0523  . loratadine (CLARITIN) tablet 10 mg  10 mg Oral Daily Thornton Park, MD   10 mg at 01/03/20 0816  . magnesium citrate solution 1 Bottle  1 Bottle Oral Once PRN Thornton Park, MD      . methocarbamol (ROBAXIN) tablet 500 mg  500 mg Oral Q6H PRN Thornton Park, MD       Or  . methocarbamol (ROBAXIN) 500 mg in dextrose 5 % 50 mL IVPB  500 mg Intravenous Q6H PRN Thornton Park, MD      . morphine 2 MG/ML injection 0.5-1 mg  0.5-1 mg Intravenous Q2H PRN Thornton Park, MD   1 mg at 01/01/20 1300  . ondansetron (ZOFRAN) tablet 4 mg  4 mg Oral Q6H PRN Thornton Park, MD       Or  . ondansetron [facility]) injection 4 mg  4 mg Intravenous Q6H PRN Thornton Park, MD      . polyethylene glycol (MIRALAX / GLYCOLAX) packet 17 g  17 g Oral Daily PRN Thornton Park, MD      . senna (SENOKOT) tablet 8.6 mg  1 tablet Oral BID Thornton Park, MD   8.6 mg at 01/03/20 0816  . tamsulosin (FLOMAX) capsule 0.4 mg  0.4 mg Oral QPC breakfast Thornton Park, MD   0.4 mg at 01/03/20 0816     Objective: Vital signs in last 24 hours: Temp:  [97.4 F (36.3 C)-101.6 F (38.7 C)] 97.5 F (36.4 C) (01/14 0746) Pulse Rate:  [68-94] 74 (01/14 0746) Resp:  [14-18] 18 (01/14 0746) BP: (101-139)/(42-58) 119/54 (01/14 0746) SpO2:  [94 %-100 %] 96 % (01/14 0746)  Intake/Output from previous day: 01/13 0701 - 01/14 0700 In: 1618.1 [P.O.:120;  I.V.:898.3; IV Piggyback:599.9] Out: 825 [Urine:825] Intake/Output this shift: No intake/output data recorded.  Physical Exam Vitals and nursing note reviewed.  Constitutional:      General: He is not in acute distress.    Appearance: Normal appearance. He is not ill-appearing, toxic-appearing or diaphoretic.  HENT:     Head:  Normocephalic and atraumatic.  Pulmonary:     Effort: Pulmonary effort is normal. No respiratory distress.  Skin:    General: Skin is warm and dry.  Neurological:     Mental Status: He is alert and oriented to person, place, and time.  Psychiatric:        Mood and Affect: Mood normal.        Behavior: Behavior normal.    Lab Results:  Recent Labs    01/02/20 0906 01/03/20 0418  WBC 37.5* 26.6*  HGB 8.3* 7.8*  HCT 24.9* 23.2*  PLT 179 155   BMET Recent Labs    01/02/20 0906 01/03/20 0418  NA 134* 134*  K 3.8 3.5  CL 98 100  CO2 24 23  GLUCOSE 118* 132*  BUN 51* 61*  CREATININE 1.80* 1.72*  CALCIUM 9.3 9.2   PT/INR Recent Labs    01/02/20 0906  LABPROT 15.3*  INR 1.2   Assessment & Plan: 79 year old male s/p traumatic Foley placement and gross hematuria. Foley DC'd, condom cath in place, [place] resolved.  On broad-spectrum antibiotics for Proteus bacteremia with improvement in leukocytosis.  Urine culture pending.  Recommend bladder scan today to ensure patient is sufficiently voiding.  Debroah Loop, PA-C 01/03/2020

## 2020-01-03 NOTE — Progress Notes (Signed)
Pharmacy Antibiotic Note  Grant Mcdaniel is a 79 y.o. male admitted on 12/27/2019. Patient being followed by urology. SCr continues to rise, but with improvement in hematuria. Pharmacy has been consulted cefepime dosing.  BCx 2 of 4 positive for GNR. BCID showing Proteus species. Antibiotics were changed to ceftriaxone over night. Dr. Posey Pronto would like antibiotics changed back to cefepime since infection could be procedure related.   Plan: Restart Cefepime 2 g IV q12h based on current renal function  Height: 6\' 2"  (188 cm) Weight: 234 lb (106.1 kg) IBW/kg (Calculated) : 82.2  Temp (24hrs), Avg:98.9 F (37.2 C), Min:97.4 F (36.3 C), Max:101.6 F (38.7 C)  Recent Labs  Lab 12/30/19 0341 12/30/19 0341 12/31/19 0502 01/01/20 0800 01/01/20 1015 01/02/20 0459 01/02/20 0906 01/03/20 0418  WBC 10.9*   < > 11.7* 3.0*  --  39.5* 37.5* 26.6*  CREATININE 1.37*  --  1.08  --  1.61*  --  1.80* 1.72*   < > = values in this interval not displayed.    Estimated Creatinine Clearance: 46.7 mL/min (A) (by C-G formula based on SCr of 1.72 mg/dL (H)).    Allergies  Allergen Reactions  . Sulfa Antibiotics Nausea And Vomiting    Antimicrobials this admission: Vancomycin 1/13 >> 1/14 Cefepime 1/13 >>   Dose adjustments this admission: NA  Microbiology results: 1/13 BCx: 2 of 4 GNR 1/13 BCID: Proteus species  1/13 UCx: ordered   Thank you for allowing pharmacy to be a part of this patient's care.  Pernell Dupre, PharmD, BCPS Clinical Pharmacist 01/03/2020 7:32 AM

## 2020-01-03 NOTE — Progress Notes (Addendum)
Subjective:  POD #6 s/p right hip hemiathroplasty.   Patient reports right hip pain as mild.  PT in room who explains that patient did much better with therapy today and was able to walk out to the hallway.  Patient feels better today.  Patient has bacteremia with gram negative rods.  Objective:   VITALS:   Vitals:   01/02/20 1936 01/02/20 2328 01/03/20 0442 01/03/20 0746  BP: (!) 101/45 (!) 112/47 (!) 127/58 (!) 119/54  Pulse: 82 68 77 74  Resp: 14 16 14 18   Temp: 97.9 F (36.6 C) 98 F (36.7 C) (!) 97.4 F (36.3 C) (!) 97.5 F (36.4 C)  TempSrc: Oral Oral Oral Oral  SpO2: 94% 100% 96% 96%  Weight:      Height:        PHYSICAL EXAM: Patient alert and follows commands.  No acute distress. I personally changed the patient's dressing today. No drainage from the incision.   Right lower extremity: + ecchymosis(resolving), + generalized thigh and lower leg edema.  No erythema around surgical site. Neurovascular intact Sensation intact distally Intact pulses distally Dorsiflexion/Plantar flexion intact Incision: dressing with mild drainage.  No change from yesterday No cellulitis present Compartment soft  LABS  Results for orders placed or performed during the hospital encounter of 12/27/19 (from the past 24 hour(s))  Glucose, capillary     Status: Abnormal   Collection Time: 01/02/20  4:38 PM  Result Value Ref Range   Glucose-Capillary 131 (H) 70 - 99 mg/dL   Comment 1 Notify RN   Glucose, capillary     Status: Abnormal   Collection Time: 01/02/20  4:45 PM  Result Value Ref Range   Glucose-Capillary 144 (H) 70 - 99 mg/dL  Glucose, capillary     Status: Abnormal   Collection Time: 01/02/20  8:39 PM  Result Value Ref Range   Glucose-Capillary 193 (H) 70 - 99 mg/dL  Glucose, capillary     Status: Abnormal   Collection Time: 01/02/20  8:46 PM  Result Value Ref Range   Glucose-Capillary 192 (H) 70 - 99 mg/dL   Comment 1 Notify RN   CBC with Differential/Platelet      Status: Abnormal   Collection Time: 01/03/20  4:18 AM  Result Value Ref Range   WBC 26.6 (H) 4.0 - 10.5 K/uL   RBC 2.69 (L) 4.22 - 5.81 MIL/uL   Hemoglobin 7.8 (L) 13.0 - 17.0 g/dL   HCT 23.2 (L) 39.0 - 52.0 %   MCV 86.2 80.0 - 100.0 fL   MCH 29.0 26.0 - 34.0 pg   MCHC 33.6 30.0 - 36.0 g/dL   RDW 14.3 11.5 - 15.5 %   Platelets 155 150 - 400 K/uL   nRBC 0.0 0.0 - 0.2 %   Neutrophils Relative % 83 %   Neutro Abs 22.3 (H) 1.7 - 7.7 K/uL   Lymphocytes Relative 2 %   Lymphs Abs 0.4 (L) 0.7 - 4.0 K/uL   Monocytes Relative 2 %   Monocytes Absolute 0.5 0.1 - 1.0 K/uL   Eosinophils Relative 0 %   Eosinophils Absolute 0.0 0.0 - 0.5 K/uL   Basophils Relative 0 %   Basophils Absolute 0.1 0.0 - 0.1 K/uL   WBC Morphology DOHLE BODIES    RBC Morphology MORPHOLOGY UNREMARKABLE    Smear Review Normal platelet morphology    Immature Granulocytes 13 %   Abs Immature Granulocytes 3.35 (H) 0.00 - 0.07 K/uL  Comprehensive metabolic panel  Status: Abnormal   Collection Time: 01/03/20  4:18 AM  Result Value Ref Range   Sodium 134 (L) 135 - 145 mmol/L   Potassium 3.5 3.5 - 5.1 mmol/L   Chloride 100 98 - 111 mmol/L   CO2 23 22 - 32 mmol/L   Glucose, Bld 132 (H) 70 - 99 mg/dL   BUN 61 (H) 8 - 23 mg/dL   Creatinine, Ser 1.72 (H) 0.61 - 1.24 mg/dL   Calcium 9.2 8.9 - 10.3 mg/dL   Total Protein 5.7 (L) 6.5 - 8.1 g/dL   Albumin 2.6 (L) 3.5 - 5.0 g/dL   AST 53 (H) 15 - 41 U/L   ALT 35 0 - 44 U/L   Alkaline Phosphatase 191 (H) 38 - 126 U/L   Total Bilirubin 3.8 (H) 0.3 - 1.2 mg/dL   GFR calc non Af Amer 38 (L) >60 mL/min   GFR calc Af Amer 43 (L) >60 mL/min   Anion gap 11 5 - 15  Magnesium     Status: None   Collection Time: 01/03/20  4:18 AM  Result Value Ref Range   Magnesium 1.8 1.7 - 2.4 mg/dL  Procalcitonin     Status: None   Collection Time: 01/03/20  4:18 AM  Result Value Ref Range   Procalcitonin >150.00 ng/mL  Glucose, capillary     Status: Abnormal   Collection Time: 01/03/20   7:46 AM  Result Value Ref Range   Glucose-Capillary 118 (H) 70 - 99 mg/dL   Comment 1 Notify RN   Glucose, capillary     Status: Abnormal   Collection Time: 01/03/20 11:41 AM  Result Value Ref Range   Glucose-Capillary 170 (H) 70 - 99 mg/dL   Comment 1 Notify RN     DG Chest Port 1 View  Result Date: 01/02/2020 CLINICAL DATA:  Shortness of breath. History of coronary artery disease, diabetes and hypertension. EXAM: PORTABLE CHEST 1 VIEW COMPARISON:  Radiographs 12/27/2019 and 01/01/2020. FINDINGS: 1314 hours. The heart size and mediastinal contours are stable. There is aortic and coronary artery atherosclerosis. There is interval improved aeration of the lung bases with mild residual atelectasis. No edema, confluent airspace opacity, pneumothorax or significant pleural effusion. The bones appear unchanged. IMPRESSION: Interval improved aeration of the lung bases with mild residual atelectasis. No acute findings. Electronically Signed   By: Richardean Sale M.D.   On: 01/02/2020 14:16    Assessment/Plan: 6 Days Post-Op   Active Problems:   Closed hip fracture requiring operative repair, right, sequela   Complication of Foley catheter ([place])   Leukocytosis  Patient on lovenox.  Continue IV antibiotics for gram - rods per medicine.   Continue PT as tolerated.  Patient will need SNF after discharge from the hospital.    Thornton Park , MD 01/03/2020, 1:38 PM

## 2020-01-03 NOTE — TOC Progression Note (Addendum)
Transition of Care [facility]) - Progression Note    Patient Details  Name: Grant Mcdaniel MRN: [id number] Date of Birth: February 04, 1941  [facility]) CM/SW Contact  Jil Penland, Gardiner Rhyme, LCSW Phone Number: 01/03/2020, 9:00 AM  Clinical Narrative:   [facility] Sparrow in anticipation for transfer to [facility] tomorrow according to MD, due to medical issues. Keep pt and family updated. According to MD pt will not be medically ready for 2-3 days.   Ref [id number]    Expected Discharge Plan: [place] Barriers to Discharge: Ship broker, Continued Medical Work up  Expected Discharge Plan and Services Expected Discharge Plan: [place] In-house Referral: Clinical Social Work Discharge Planning Services: NA   Living arrangements for the past 2 months: Single Family Home                                       Social Determinants of Health (SDOH) Interventions    Readmission Risk Interventions No flowsheet data found.

## 2020-01-03 NOTE — Progress Notes (Signed)
PROGRESS NOTE                                                                                                                                                                                                             Patient Demographics:    Grant Mcdaniel, is a 79 y.o. male, DOB - 09-27-1941, CR:[id number]  Admit date - 12/27/2019   Admitting Physician Loletha Grayer, MD  Outpatient Primary MD for the patient is [facility]  LOS - 7  Outpatient Specialists: None  Chief Complaint  Patient presents with  . Fall       Brief Narrative 79 year old male with history of coronary artery disease, diabetes mellitus, hypertension, hypothyroidism, PVD, chronic kidney disease stage IIIa presented to the hospital after tripping on his driveway and fell on his right side.  2 weeks back he also had a fall bruising his right eye and forehead.  At that time head CT done was negative for any bleeding or injury.  X-ray done in the ED showed right hip fracture.  Patient admitted to hospital service and underwent right hip hemiarthroplasty on 1/8. On 1/9, when physical therapist was evaluating the patient she noticed patient to have poor coordination in his left leg.  MRI brain was done showing acute small right cerebellar stroke.  Subjective:   No further hematuria.  No nausea no vomiting.  No fever no chills.  No chest pain no abdominal pain.   Assessment  & Plan :   Principal problems:   Closed hip fracture requiring operative repair, right, sequela Pain better controlled.  Continue as needed Vicodin and Robaxin.  (Monitor closely given acute confusion) Continue bowel regimen.  Orthopedics recommends full dose aspirin 325 mg twice daily for DVT prophylaxis (on hold due to hematuria).  Acute right cerebellar stroke ([place]) Recent fall at home hitting her right side of the head 2 weeks back.  Noted for poor left leg  coordination during PT evaluation postop. MRI shows right cerebellar stroke.  Suspected to be due to small vessel disease. 2D echo with normal EF and no wall motion abnormality or thrombus.  Carotid ultrasound without significant stenosis.  Patient chronically on Plavix for PVD.  Neurology recommended dual antiplatelet therapy with aspirin and Plavix, however both on hold due to hematuria.  Continue statin (LDL of 57). I discussed  with neurology and given active hematuria, recommends using aspirin alone for now.  We will start on 01/04/2020. CT angiogram head and neck done negative for vertebral artery stenosis.  Also recommends 30-day Holter monitoring, to rule out possible A. fib contributing to an embolic stroke.  Gram-negative sepsis. Proteus bacteremia Patient's temperature was elevated.  Leukocytosis was significantly worsening. Significant bandemia seen as well. Patient's blood cultures were performed.  Which are positive for Proteus as well as Enterobacteriaceae Urine cultures were performed. Chest x-ray was performed which did not show any pneumonia. Patient started on IV vancomycin and cefepime for broad-spectrum coverage for now. We will continue the cefepime and monitor response. IV fluid with LR.  Active problems Acute metabolic encephalopathy (0000000) Unclear etiology.  Head CT negative for acute findings.  Chest x-ray negative for infiltrate.  Patient afebrile.  Minimize narcotic and benzo for now.  Will check UA to rule out UTI.    Complication of Foley catheter [facility]) Patient has BPH on Flomax.  Patient had Foley placed in with balloon blown up into the prostate.  It was removed and urology placed a new catheter and recommends it to be in for 1 week. Noted for persistent hematuria past 72 hours.  Urology consulted again who evaluated the patient on 1/11 and feels that Foley may be irritating the prostate and causing ongoing bleeding.  Recommended irrigating catheter overnight  and voiding trial this morning.   Acute postoperative blood loss anemia Hemoglobin dropped to 7.7 with persistent hematuria.  Received 1 unit PRBC.  Aspirin Plavix on hold.  H&H currently stable.  Pending stool for Hemoccult.  If H&H stable and no further hematuria will resume full dose aspirin.  Hypothyroidism Continue Synthroid  History of CAD and peripheral vascular disease Continue statin.  Plavix on hold for hematuria.  Essential hypertension Stable.  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  Continue sliding scale coverage     Code Status : DNR  Family Communication  : None  Disposition Plan  : SNF possibly in the next 48 hours sepsis improves  Barriers For Discharge : Active symptoms (hematuria, acute encephalopathy)  Consults  : Orthopedic, neurology, urology  Procedures  : Right hemiarthroplasty, MRI brain  DVT Prophylaxis  :  Lovenox -  Lab Results  Component Value Date   PLT 155 01/03/2020    Antibiotics    Anti-infectives (From admission, onward)   Start     Dose/Rate Route Frequency Ordered Stop   01/03/20 0800  ceFEPIme (MAXIPIME) 2 g in sodium chloride 0.9 % 100 mL IVPB     2 g 200 mL/hr over 30 Minutes Intravenous Every 12 hours 01/03/20 0728     01/03/20 0500  cefTRIAXone (ROCEPHIN) 2 g in sodium chloride 0.9 % 100 mL IVPB  Status:  Discontinued     2 g 200 mL/hr over 30 Minutes Intravenous Every 24 hours 01/03/20 0454 01/03/20 0720   01/03/20 0445  cefTRIAXone (ROCEPHIN) 2,000 mg in sodium chloride 0.9 % 100 mL IVPB  Status:  Discontinued     2,000 mg 200 mL/hr over 30 Minutes Intravenous Every 24 hours 01/03/20 0446 01/03/20 0454   01/02/20 1700  vancomycin (VANCOREADY) IVPB 2000 mg/400 mL     2,000 mg 200 mL/hr over 120 Minutes Intravenous  Once 01/02/20 1536 01/02/20 1932   01/02/20 1700  ceFEPIme (MAXIPIME) 2 g in sodium chloride 0.9 % 100 mL IVPB  Status:  Discontinued  2 g 200 mL/hr  over 30 Minutes Intravenous Every 12 hours 01/02/20 1536 01/03/20 0444   01/02/20 1548  vancomycin variable dose per unstable renal function (pharmacist dosing)  Status:  Discontinued      Does not apply See admin instructions 01/02/20 1548 01/03/20 0454   12/28/19 1400  ceFAZolin (ANCEF) IVPB 1 g/50 mL premix     1 g 100 mL/hr over 30 Minutes Intravenous Every 6 hours 12/28/19 1151 12/29/19 1900   12/28/19 0747  ceFAZolin (ANCEF) 2-4 GM/100ML-% IVPB    Note to Pharmacy: Norton Blizzard  : cabinet override      12/28/19 0747 12/28/19 0827   12/27/19 2019  ceFAZolin (ANCEF) IVPB 2g/100 mL premix     2 g 200 mL/hr over 30 Minutes Intravenous 30 min pre-op 12/27/19 2019 12/28/19 0835        Objective:   Vitals:   01/02/20 2328 01/03/20 0442 01/03/20 0746 01/03/20 1548  BP: (!) 112/47 (!) 127/58 (!) 119/54 (!) 115/53  Pulse: 68 77 74 70  Resp: 16 14 18 17   Temp: 98 F (36.7 C) (!) 97.4 F (36.3 C) (!) 97.5 F (36.4 C) 98.7 F (37.1 C)  TempSrc: Oral Oral Oral Oral  SpO2: 100% 96% 96% 96%  Weight:      Height:        Wt Readings from Last 3 Encounters:  12/27/19 106.1 kg  12/27/19 106.1 kg  12/17/19 104.3 kg     Intake/Output Summary (Last 24 hours) at 01/03/2020 2006 Last data filed at 01/03/2020 1856 Gross per 24 hour  Intake 1238.27 ml  Output 1700 ml  Net -461.73 ml   General: alert and oriented to time, place, and person. Appear in mild distress, affect appropriate no significant shaking which is new per nursing. Dark urine in the Foley catheter bag Eyes: PERRL, Conjunctiva normal ENT: Oral Mucosa Clear, moist  Neck: no JVD, no Abnormal Mass Or lumps Cardiovascular: S1 and S2 Present, no Murmur, peripheral pulses symmetrical Respiratory: good respiratory effort, Bilateral Air entry equal and Decreased, no signs of accessory muscle use, Clear to Auscultation, no Crackles, no wheezes Abdomen: Bowel Sound present, Soft and no tenderness, no hernia Skin: no rashes    Extremities: no Pedal edema, no calf tenderness Neurologic: without any new focal findings  Gait not checked due to patient safety concerns       Data Review:    CBC Recent Labs  Lab 12/31/19 0502 01/01/20 0800 01/02/20 0459 01/02/20 0906 01/03/20 0418  WBC 11.7* 3.0* 39.5* 37.5* 26.6*  HGB 8.7* 10.5* 8.2* 8.3* 7.8*  HCT 26.3* 32.3* 24.4* 24.9* 23.2*  PLT 146* 152 177 179 155  MCV 87.7 88.7 87.1 87.4 86.2  MCH 29.0 28.8 29.3 29.1 29.0  MCHC 33.1 32.5 33.6 33.3 33.6  RDW 14.3 13.9 14.2 14.4 14.3  LYMPHSABS  --   --   --  1.0 0.4*  MONOABS  --   --   --  1.5* 0.5  EOSABS  --   --   --  0.0 0.0  BASOSABS  --   --   --  0.1 0.1    Chemistries  Recent Labs  Lab 12/30/19 0341 12/31/19 0502 01/01/20 1015 01/02/20 0906 01/03/20 0418  NA 135 131* 133* 134* 134*  K 4.8 4.2 3.8 3.8 3.5  CL 100 98 99 98 100  CO2 24 23 20* 24 23  GLUCOSE 148* 140* 172* 118* 132*  BUN 30* 29* 36* 51* 61*  CREATININE 1.37* 1.08 1.61* 1.80* 1.72*  CALCIUM 9.5 9.6 9.4 9.3 9.2  MG  --   --   --   --  1.8  AST  --   --   --  66* 53*  ALT  --   --   --  41 35  ALKPHOS  --   --   --  173* 191*  BILITOT  --   --   --  4.5* 3.8*   ------------------------------------------------------------------------------------------------------------------ No results for input(s): CHOL, HDL, LDLCALC, TRIG, CHOLHDL, LDLDIRECT in the last 72 hours.  Lab Results  Component Value Date   HGBA1C 7.3 (H) 12/27/2019   ------------------------------------------------------------------------------------------------------------------ No results for input(s): TSH, T4TOTAL, T3FREE, THYROIDAB in the last 72 hours.  Invalid input(s): FREET3 ------------------------------------------------------------------------------------------------------------------ Recent Labs    01/02/20 0906  RETICCTPCT 2.8    Coagulation profile Recent Labs  Lab 01/02/20 0906  INR 1.2    No results for input(s): DDIMER in the  last 72 hours.  Cardiac Enzymes No results for input(s): CKMB, TROPONINI, MYOGLOBIN in the last 168 hours.  Invalid input(s): CK ------------------------------------------------------------------------------------------------------------------ No results found for: BNP  Inpatient Medications  Scheduled Meds: .  stroke: mapping our early stages of recovery book   Does not apply Once  . atorvastatin  40 mg Oral q1800  . Chlorhexidine Gluconate Cloth  6 each Topical Daily  . docusate sodium  100 mg Oral BID  . enoxaparin (LOVENOX) injection  40 mg Subcutaneous Q24H  . fluticasone  1 spray Each Nare Daily  . insulin aspart  0-5 Units Subcutaneous QHS  . insulin aspart  0-9 Units Subcutaneous TID WC  . levothyroxine  75 mcg Oral Q0600  . loratadine  10 mg Oral Daily  . senna  1 tablet Oral BID  . tamsulosin  0.4 mg Oral QPC breakfast   Continuous Infusions: . ceFEPime (MAXIPIME) IV 2 g (01/03/20 1233)  . lactated ringers 100 mL/hr at 01/03/20 0624  . methocarbamol (ROBAXIN) IV     PRN Meds:.acetaminophen, alum & mag hydroxide-simeth, bisacodyl, HYDROcodone-acetaminophen, HYDROcodone-acetaminophen, magnesium citrate, methocarbamol **OR** methocarbamol (ROBAXIN) IV, morphine injection, ondansetron **OR** ondansetron (ZOFRAN) IV, polyethylene glycol  Micro Results Recent Results (from the past 240 hour(s))  SARS CORONAVIRUS 2 (TAT 6-24 HRS) Nasopharyngeal Nasopharyngeal Swab     Status: None   Collection Time: 12/27/19  5:19 PM   Specimen: Nasopharyngeal Swab  Result Value Ref Range Status   SARS Coronavirus 2 NEGATIVE NEGATIVE Final    Comment: (NOTE) SARS-CoV-2 target nucleic acids are NOT DETECTED. The SARS-CoV-2 RNA is generally detectable in upper and lower respiratory specimens during the acute phase of infection. Negative results do not preclude SARS-CoV-2 infection, do not rule out co-infections with other pathogens, and should not be used as the sole basis for treatment  or other patient management decisions. Negative results must be combined with clinical observations, patient history, and epidemiological information. The expected result is Negative. Fact Sheet for Patients: SugarRoll.be Fact Sheet for Healthcare Providers: https://www.woods-mathews.com/ This test is not yet approved or cleared by the [place] FDA and  has been authorized for detection and/or diagnosis of SARS-CoV-2 by FDA under an Emergency Use Authorization (EUA). This EUA will remain  in effect (meaning this test can be used) for the duration of the COVID-19 declaration under Section 56 4(b)(1) of the Act, 21 U.S.C. section 360bbb-3(b)(1), unless the authorization is terminated or revoked sooner. Performed at [facility], [place] [address], [place],  [address]  SARS Coronavirus 2 by RT PCR (hospital order, performed in [facility] lab)     Status: None   Collection Time: 12/27/19 10:17 PM  Result Value Ref Range Status   SARS Coronavirus 2 NEGATIVE NEGATIVE Final    Comment: (NOTE) SARS-CoV-2 target nucleic acids are NOT DETECTED. The SARS-CoV-2 RNA is generally detectable in upper and lower respiratory specimens during the acute phase of infection. The lowest concentration of SARS-CoV-2 viral copies this assay can detect is 250 copies / mL. A negative result does not preclude SARS-CoV-2 infection and should not be used as the sole basis for treatment or other patient management decisions.  A negative result may occur with improper specimen collection / handling, submission of specimen other than nasopharyngeal swab, presence of viral mutation(s) within the areas targeted by this assay, and inadequate number of viral copies (<250 copies / mL). A negative result must be combined with clinical observations, patient history, and epidemiological information. Fact Sheet for Patients:     StrictlyIdeas.no Fact Sheet for Healthcare Providers: BankingDealers.co.za This test is not yet approved or cleared  by the [place] FDA and has been authorized for detection and/or diagnosis of SARS-CoV-2 by FDA under an Emergency Use Authorization (EUA).  This EUA will remain in effect (meaning this test can be used) for the duration of the COVID-19 declaration under Section 564(b)(1) of the Act, 21 U.S.C. section 360bbb-3(b)(1), unless the authorization is terminated or revoked sooner. Performed at [facility], [address] [place], [place] [address]   CULTURE, BLOOD (ROUTINE X 2) w Reflex to ID Panel     Status: None (Preliminary result)   Collection Time: 01/02/20  1:24 PM   Specimen: BLOOD  Result Value Ref Range Status   Specimen Description BLOOD BLOOD RIGHT HAND  Final   Special Requests   Final    BOTTLES DRAWN AEROBIC AND ANAEROBIC Blood Culture results may not be optimal due to an inadequate volume of blood received in culture bottles   Culture  Setup Time   Final    GRAM NEGATIVE RODS IN BOTH AEROBIC AND ANAEROBIC BOTTLES CRITICAL RESULT CALLED TO, READ BACK BY AND VERIFIED WITH: DAVID BESANTI AT [place] ON 01/03/20 RWW Performed at [facility], [address], [place], [place] [address]    Culture GRAM NEGATIVE RODS  Final   Report Status PENDING  Incomplete  CULTURE, BLOOD (ROUTINE X 2) w Reflex to ID Panel     Status: None (Preliminary result)   Collection Time: 01/02/20  1:38 PM   Specimen: BLOOD  Result Value Ref Range Status   Specimen Description BLOOD BLOOD LEFT HAND  Final   Special Requests   Final    BOTTLES DRAWN AEROBIC AND ANAEROBIC Blood Culture adequate volume   Culture  Setup Time   Final    Organism ID to follow IN BOTH AEROBIC AND ANAEROBIC BOTTLES GRAM NEGATIVE RODS CRITICAL RESULT CALLED TO, READ BACK BY AND VERIFIED WITH: DAVID BESANTI AT [place] ON 01/03/20 RWW Performed  at [facility], [address] [place], [place] [address]    Culture GRAM NEGATIVE RODS  Final   Report Status PENDING  Incomplete  Blood Culture ID Panel (Reflexed)     Status: Abnormal   Collection Time: 01/02/20  1:38 PM  Result Value Ref Range Status   Enterococcus species NOT DETECTED NOT DETECTED Final   Listeria monocytogenes NOT DETECTED NOT DETECTED Final   Staphylococcus species NOT DETECTED NOT DETECTED Final  Staphylococcus aureus (BCID) NOT DETECTED NOT DETECTED Final   Streptococcus species NOT DETECTED NOT DETECTED Final   Streptococcus agalactiae NOT DETECTED NOT DETECTED Final   Streptococcus pneumoniae NOT DETECTED NOT DETECTED Final   Streptococcus pyogenes NOT DETECTED NOT DETECTED Final   Acinetobacter baumannii NOT DETECTED NOT DETECTED Final   Enterobacteriaceae species DETECTED (A) NOT DETECTED Final    Comment: Enterobacteriaceae represent a large family of gram-negative bacteria, not a single organism. CRITICAL RESULT CALLED TO, READ BACK BY AND VERIFIED WITH: [place] ON 01/03/20 RWW    Enterobacter cloacae complex NOT DETECTED NOT DETECTED Final   Escherichia coli NOT DETECTED NOT DETECTED Final   Klebsiella oxytoca NOT DETECTED NOT DETECTED Final   Klebsiella pneumoniae NOT DETECTED NOT DETECTED Final   Proteus species DETECTED (A) NOT DETECTED Final    Comment: CRITICAL RESULT CALLED TO, READ BACK BY AND VERIFIED WITH: DAVID BESANTI AT 0410 ON 01/03/20 RWW    Serratia marcescens NOT DETECTED NOT DETECTED Final   Carbapenem resistance NOT DETECTED NOT DETECTED Final   Haemophilus influenzae NOT DETECTED NOT DETECTED Final   Neisseria meningitidis NOT DETECTED NOT DETECTED Final   Pseudomonas aeruginosa NOT DETECTED NOT DETECTED Final   Candida albicans NOT DETECTED NOT DETECTED Final   Candida glabrata NOT DETECTED NOT DETECTED Final   Candida krusei NOT DETECTED NOT DETECTED Final   Candida parapsilosis NOT DETECTED NOT  DETECTED Final   Candida tropicalis NOT DETECTED NOT DETECTED Final    Comment: Performed at [facility], [address] [place], [place] [address]    Radiology Reports CT ANGIO HEAD W OR WO CONTRAST  Result Date: 12/31/2019 CLINICAL DATA:  Stroke, follow-up EXAM: CT ANGIOGRAPHY HEAD AND NECK TECHNIQUE: Multidetector CT imaging of the head and neck was performed using the standard protocol during bolus administration of intravenous contrast. Multiplanar CT image reconstructions and MIPs were obtained to evaluate the vascular anatomy. Carotid stenosis measurements (when applicable) are obtained utilizing NASCET criteria, using the distal internal carotid diameter as the denominator. CONTRAST:  60mL OMNIPAQUE IOHEXOL 350 MG/ML SOLN COMPARISON:  12/17/2019 head CT, 12/29/2019 MRI FINDINGS: CT HEAD FINDINGS Brain: There is no acute intracranial hemorrhage. Small chronic cerebellar infarcts identified. More recent small right cerebellar infarct is better seen on MRI. Gray-white differentiation is preserved. Ventricles are stable in size. No extra-axial collection. Vascular: There is mild intracranial atherosclerotic calcification at the skull base. Skull: Unremarkable. Sinuses: Mild mucosal thickening. Orbits: Orbits are unremarkable. Review of the MIP images confirms the above findings CTA NECK FINDINGS Aortic arch: Mild calcified plaque. Great vessel origins are patent. Right carotid system: Patent. Mild calcified plaque at the ICA origin without measurable stenosis. Left carotid system: Patent. No measurable stenosis at the ICA origin. Vertebral arteries: Patent and codominant. Calcified plaque is present at the left vertebral artery origin with mild stenosis. Skeleton: Multilevel degenerative changes of the cervical spine. There are bulky anterior bridging osteophytes with indentation of the posterior pharynx and esophagus. Other neck: No neck mass or adenopathy. Upper chest: No apical lung mass.  Review of the MIP images confirms the above findings CTA HEAD FINDINGS Anterior circulation: Intracranial internal carotid arteries patent with calcified plaque causing mild stenosis. Anterior and middle cerebral arteries are patent. Posterior circulation: Intracranial vertebral arteries are patent. Posterior inferior cerebellar arteries are patent. Basilar artery is patent. Superior cerebellar arteries are patent. Posterior cerebral arteries are patent. Venous sinuses: As permitted by contrast timing, patent. Review of the MIP images confirms  the above findings IMPRESSION: No acute intracranial hemorrhage or new loss of gray-white differentiation. Recent small cerebellar infarct is better seen on MRI. No hemodynamically significant stenosis or evidence of dissection. Electronically Signed   By: Macy Mis M.D.   On: 12/31/2019 13:31   DG Elbow Complete Right  Result Date: 12/27/2019 CLINICAL DATA:  Recent fall with right elbow pain, initial encounter EXAM: RIGHT ELBOW - COMPLETE 3+ VIEW COMPARISON:  None. FINDINGS: Considerable degenerative changes of the elbow joint are noted particularly in the articulation of the humerus and ulna. Soft tissue swelling is noted posteriorly consistent with the recent injury. No joint effusion is seen. Large olecranon spurs are seen as well as some findings suggestive of olecranon bursitis. IMPRESSION: No acute fracture is noted. Soft tissue changes are seen consistent with the given clinical history. Electronically Signed   By: Inez Catalina M.D.   On: 12/27/2019 16:16   CT HEAD WO CONTRAST  Result Date: 01/01/2020 CLINICAL DATA:  Cerebellar infarct, coronary artery disease, chronic kidney disease, type II diabetes mellitus, hypertension, increased confusion EXAM: CT HEAD WITHOUT CONTRAST TECHNIQUE: Contiguous axial images were obtained from the base of the skull through the vertex without intravenous contrast. Sagittal and coronal MPR images reconstructed from axial  data set. COMPARISON:  12/31/2019 FINDINGS: Brain: Scattered motion artifacts significantly limit exam despite repeating images, patient unable to follow commands. Generalized atrophy. Stable ventricular morphology. No midline shift or mass effect. No gross hemorrhage, mass or infarct identified within limitations of motion. Vascular: Mild atherosclerotic calcifications of internal carotid arteries at skull base Skull: No gross abnormality seen Sinuses/Orbits: Small mucosal retention cyst and mild mucosal thickening in sphenoid sinus Other: N/A IMPRESSION: Exam severely limited by patient motion artifacts. Generalized atrophy without definite acute intracranial abnormalities as above. Electronically Signed   By: Lavonia Dana M.D.   On: 01/01/2020 10:08   CT Head Wo Contrast  Result Date: 12/17/2019 CLINICAL DATA:  Fall. EXAM: CT HEAD WITHOUT CONTRAST TECHNIQUE: Contiguous axial images were obtained from the base of the skull through the vertex without intravenous contrast. COMPARISON:  None. FINDINGS: Brain: No evidence of acute infarction, hemorrhage, hydrocephalus, extra-axial collection or mass lesion/mass effect. Small remote bilateral cerebellar infarcts. Mild brain atrophy. Vascular: Atherosclerotic calcification Skull: Right forehead laceration.  No calvarial fracture. Sinuses/Orbits: No evidence of injury IMPRESSION: 1. No evidence of intracranial injury. 2. Right forehead laceration without fracture. Electronically Signed   By: Monte Fantasia M.D.   On: 12/17/2019 04:30   CT ANGIO NECK W OR WO CONTRAST  Result Date: 12/31/2019 CLINICAL DATA:  Stroke, follow-up EXAM: CT ANGIOGRAPHY HEAD AND NECK TECHNIQUE: Multidetector CT imaging of the head and neck was performed using the standard protocol during bolus administration of intravenous contrast. Multiplanar CT image reconstructions and MIPs were obtained to evaluate the vascular anatomy. Carotid stenosis measurements (when applicable) are obtained  utilizing NASCET criteria, using the distal internal carotid diameter as the denominator. CONTRAST:  30mL OMNIPAQUE IOHEXOL 350 MG/ML SOLN COMPARISON:  12/17/2019 head CT, 12/29/2019 MRI FINDINGS: CT HEAD FINDINGS Brain: There is no acute intracranial hemorrhage. Small chronic cerebellar infarcts identified. More recent small right cerebellar infarct is better seen on MRI. Gray-white differentiation is preserved. Ventricles are stable in size. No extra-axial collection. Vascular: There is mild intracranial atherosclerotic calcification at the skull base. Skull: Unremarkable. Sinuses: Mild mucosal thickening. Orbits: Orbits are unremarkable. Review of the MIP images confirms the above findings CTA NECK FINDINGS Aortic arch: Mild calcified plaque. Great vessel origins are  patent. Right carotid system: Patent. Mild calcified plaque at the ICA origin without measurable stenosis. Left carotid system: Patent. No measurable stenosis at the ICA origin. Vertebral arteries: Patent and codominant. Calcified plaque is present at the left vertebral artery origin with mild stenosis. Skeleton: Multilevel degenerative changes of the cervical spine. There are bulky anterior bridging osteophytes with indentation of the posterior pharynx and esophagus. Other neck: No neck mass or adenopathy. Upper chest: No apical lung mass. Review of the MIP images confirms the above findings CTA HEAD FINDINGS Anterior circulation: Intracranial internal carotid arteries patent with calcified plaque causing mild stenosis. Anterior and middle cerebral arteries are patent. Posterior circulation: Intracranial vertebral arteries are patent. Posterior inferior cerebellar arteries are patent. Basilar artery is patent. Superior cerebellar arteries are patent. Posterior cerebral arteries are patent. Venous sinuses: As permitted by contrast timing, patent. Review of the MIP images confirms the above findings IMPRESSION: No acute intracranial hemorrhage or new  loss of gray-white differentiation. Recent small cerebellar infarct is better seen on MRI. No hemodynamically significant stenosis or evidence of dissection. Electronically Signed   By: Macy Mis M.D.   On: 12/31/2019 13:31   CT Cervical Spine Wo Contrast  Addendum Date: 12/17/2019   ADDENDUM REPORT: 12/17/2019 09:36 ADDENDUM: Correction to impression #2, the second sentence should read in the upper CERVICAL levels there is also posterior longitudinal ligament ossification. Electronically Signed   By: Monte Fantasia M.D.   On: 12/17/2019 09:36   Result Date: 12/17/2019 CLINICAL DATA:  Fall with laceration. EXAM: CT CERVICAL SPINE WITHOUT CONTRAST TECHNIQUE: Multidetector CT imaging of the cervical spine was performed without intravenous contrast. Multiplanar CT image reconstructions were also generated. COMPARISON:  None. FINDINGS: Alignment: Normal Skull base and vertebrae: Negative for fracture. 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 There is ankylosis from the occiput to T2. 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 bone island in the C2 body. Soft tissues and spinal canal: No prevertebral fluid or swelling. No visible canal hematoma. Lipoma in the posterior left para median neck the even more simple internal architecture than adjacent fat, up to 3 cm on axial slices. Disc levels:  Spondylitic changes as noted above. Upper chest: Negative IMPRESSION: 1. No acute finding. 2. 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 In the upper thoracic levels there is also posterior longitudinal ligament ossification. Electronically Signed: By: Monte Fantasia M.D. On: 12/17/2019 09:04   MR BRAIN WO CONTRAST  Result Date: 12/29/2019 CLINICAL DATA:  Acute neuro deficit, stroke suspected. Left leg symptoms. Recent fall sustaining a right femoral neck fracture treated with arthroplasty. EXAM:  MRI HEAD WITHOUT CONTRAST TECHNIQUE: Multiplanar, multiecho pulse sequences of the brain and surrounding structures were obtained without intravenous contrast. COMPARISON:  Head CT 12/17/2019 FINDINGS: Brain: There is a 6 mm acute right cerebellar infarct. Small chronic infarcts are present in the cerebellum bilaterally. No intracranial hemorrhage, mass, midline shift, or extra-axial fluid collection is identified. There is moderate cerebral atrophy. A few small foci of T2 hyperintensity in the cerebral white matter are nonspecific and not considered abnormal for age. Vascular: Major intracranial vascular flow voids are preserved. Skull and upper cervical spine: Unremarkable bone marrow signal. Prominent ligamentous thickening posterior to the dens without significant mass effect on the cervicomedullary junction. Sinuses/Orbits: Unremarkable orbits. Small right maxillary sinus mucous retention cyst. Mild scattered mucosal thickening in the paranasal sinuses, greatest in the left sphenoid sinus. Clear mastoid  air cells. Other: None. IMPRESSION: 1. Small acute right cerebellar infarct. 2. Chronic bilateral cerebellar infarcts. Electronically Signed   By: Logan Bores M.D.   On: 12/29/2019 11:48   CT ABDOMEN PELVIS W CONTRAST  Result Date: 12/28/2019 CLINICAL DATA:  Abdominal trauma with gross hematuria. EXAM: CT ABDOMEN AND PELVIS WITH CONTRAST TECHNIQUE: Multidetector CT imaging of the abdomen and pelvis was performed using the standard protocol following bolus administration of intravenous contrast. CONTRAST:  10mL OMNIPAQUE IOHEXOL 300 MG/ML  SOLN COMPARISON:  None. FINDINGS: LOWER CHEST: No basilar pleural or apical pericardial effusion. HEPATOBILIARY: Normal hepatic contours. No intra- or extrahepatic biliary dilatation. Status post cholecystectomy. PANCREAS: Normal pancreas. No ductal dilatation or peripancreatic fluid collection. SPLEEN: Normal. ADRENALS/URINARY TRACT: The adrenal glands are normal. There is  right-sided retroperitoneal stranding tracking from the right flank along the course of the right ureter, most evident at the level of the right common iliac bifurcation. The left ureter is normal. There is calcification and scarring of the left kidney upper pole. The urinary bladder is normal for degree of distention. There is a Foley catheter with the balloon inflated within the proximal penile urethra. STOMACH/BOWEL: There is no hiatal hernia. Normal duodenal course and caliber. No small bowel dilatation or inflammation. No focal colonic abnormality. Surgically absent. VASCULAR/LYMPHATIC: There is calcific atherosclerosis of the abdominal aorta. No abdominal or pelvic lymphadenopathy. REPRODUCTIVE: Markedly enlarged prostate measures 6.9 x 5.9 x 6.0 cm. MUSCULOSKELETAL. There is a comminuted fracture of the right femoral neck. There are bridging osteophytes of both sacroiliac joints. There are anterior lumbar vertebral enthesophytes and lower thoracic spinous process enthesophytes. There is moderate-to-severe foraminal stenosis bilaterally at L4-5 and L5-S1. OTHER: None. IMPRESSION: 1. Right-sided retroperitoneal stranding tracking from the right flank along the course of the right ureter, most evident at the level of the right common iliac bifurcation. Given the recent fall, this might be a sequela of trauma. Ascending urinary tract infection might also cause this appearance. 2. Foley catheter balloon is inflated within the proximal penile urethra. Removal is recommended. 3. Comminuted fracture of the right femoral neck. 4. Markedly enlarged prostate. 5. Aortic Atherosclerosis (ICD10-I70.0). Electronically Signed   By: Ulyses Jarred M.D.   On: 12/28/2019 03:42   US RENAL  Result Date: 12/30/2019 CLINICAL DATA:  Hematuria EXAM: RENAL / URINARY TRACT ULTRASOUND COMPLETE COMPARISON:  None. FINDINGS: Right Kidney: Renal measurements: 12 x 4.2 x 5.0 cm = volume: 134 mL. Mildly echogenic cortex. Left Kidney: Renal  measurements: 10.9 x 4.5 x 4.9 cm = volume: 128 mL. Limited evaluation. Increased cortical echogenicity in a mildly nodular contour. Bladder: Decompressed with a Foley catheter. Other: None. IMPRESSION: 1. Mildly echogenic cortex bilaterally suggesting the possibility medical renal disease. Evaluation of the left kidney is limited. 2. The bladder is decompressed with a Foley catheter. Electronically Signed   By: Dorise Bullion III M.D   On: 12/30/2019 12:19   US Carotid Bilateral (at [facility] and [facility] only)  Result Date: 12/29/2019 CLINICAL DATA:  Acute cerebellar infarct EXAM: BILATERAL CAROTID DUPLEX ULTRASOUND TECHNIQUE: Pearline Cables scale imaging, color Doppler and duplex ultrasound were performed of bilateral carotid and vertebral arteries in the neck. COMPARISON:  12/29/2019 FINDINGS: Criteria: Quantification of carotid stenosis is based on velocity parameters that correlate the residual internal carotid diameter with NASCET-based stenosis levels, using the diameter of the distal internal carotid lumen as the denominator for stenosis measurement. The following velocity measurements were obtained: RIGHT ICA: 101/15 cm/sec CCA: 0000000 cm/sec SYSTOLIC ICA/CCA RATIO:  1.3 ECA: 133 cm/sec LEFT ICA: 98/17 cm/sec CCA: 123456 cm/sec SYSTOLIC ICA/CCA RATIO:  1.0 ECA: 127 cm/sec RIGHT CAROTID ARTERY: Minor echogenic shadowing plaque formation. No hemodynamically significant right ICA stenosis, velocity elevation, or turbulent flow. Degree of narrowing less than 50%. RIGHT VERTEBRAL ARTERY:  Antegrade LEFT CAROTID ARTERY: Similar scattered minor echogenic plaque formation. No hemodynamically significant left ICA stenosis, velocity elevation, or turbulent flow. LEFT VERTEBRAL ARTERY:  Antegrade IMPRESSION: Minor carotid atherosclerosis. No hemodynamically significant ICA stenosis. Degree of narrowing less than 50% bilaterally by ultrasound criteria. Patent antegrade vertebral flow bilaterally Electronically Signed   By: Jerilynn Mages.  Shick M.D.    On: 12/29/2019 13:55   DG Chest Port 1 View  Result Date: 01/02/2020 CLINICAL DATA:  Shortness of breath. History of coronary artery disease, diabetes and hypertension. EXAM: PORTABLE CHEST 1 VIEW COMPARISON:  Radiographs 12/27/2019 and 01/01/2020. FINDINGS: 1314 hours. The heart size and mediastinal contours are stable. There is aortic and coronary artery atherosclerosis. There is interval improved aeration of the lung bases with mild residual atelectasis. No edema, confluent airspace opacity, pneumothorax or significant pleural effusion. The bones appear unchanged. IMPRESSION: Interval improved aeration of the lung bases with mild residual atelectasis. No acute findings. Electronically Signed   By: Richardean Sale M.D.   On: 01/02/2020 14:16   DG Chest Port 1 View  Result Date: 01/01/2020 CLINICAL DATA:  Metabolic encephalopathy, acute EXAM: PORTABLE CHEST 1 VIEW COMPARISON:  12/27/2019 FINDINGS: The heart size and mediastinal contours are within normal limits. Both lungs are clear. The visualized skeletal structures are unremarkable. IMPRESSION: No active disease. Electronically Signed   By: Kerby Moors M.D.   On: 01/01/2020 10:10   DG Chest Portable 1 View  Result Date: 12/27/2019 CLINICAL DATA:  Right hip pain, hypertension EXAM: PORTABLE CHEST 1 VIEW COMPARISON:  None. FINDINGS: Heart size is mildly enlarged. Aortic atherosclerosis. Both lungs are clear. The visualized skeletal structures are unremarkable. IMPRESSION: 1. No active cardiopulmonary disease. 2. Mild cardiomegaly. 3. Aortic atherosclerosis. Electronically Signed   By: Davina Poke D.O.   On: 12/27/2019 17:44   DG Knee Left Port  Result Date: 12/28/2019 CLINICAL DATA:  Left knee pain. EXAM: PORTABLE LEFT KNEE - 1-2 VIEW COMPARISON:  None. FINDINGS: No evidence of fracture, dislocation, or joint effusion. Advanced degenerative joint disease identified with marked medial compartment narrowing, subchondral sclerosis and marginal  spur formation. No fractures or dislocations. IMPRESSION: 1. Advanced degenerative joint disease. 2. No acute findings. Electronically Signed   By: Kerby Moors M.D.   On: 12/28/2019 11:28   DG Knee Right Port  Result Date: 12/28/2019 CLINICAL DATA:  Right knee pain. EXAM: PORTABLE RIGHT KNEE - 1-2 VIEW COMPARISON:  None. FINDINGS: No acute fracture is identified on this single [facility] radiograph. There is severe medial compartment joint space narrowing with mild genu varus deformity. Moderate medial and lateral compartment marginal osteophytosis is noted. There is mild diffuse soft tissue swelling. IMPRESSION: 1. Severe medial compartment osteoarthrosis. 2. No acute osseous abnormality identified. Electronically Signed   By: Logan Bores M.D.   On: 12/28/2019 11:42   ECHOCARDIOGRAM COMPLETE  Result Date: 12/30/2019   ECHOCARDIOGRAM REPORT   Patient Name:   WILLI MCCLELAND Date of Exam: 12/29/2019 Medical Rec #:  [id number]        Height:       74.0 in Accession #:    [id number]       Weight:       234.0 lb Date of Birth:  1941/06/17        BSA:          2.32 m Patient Age:    89 years         BP:           135/56 mmHg Patient Gender: M                HR:           76 bpm. Exam Location:  [facility] Procedure: 2D Echo Indications:     STROKE 434.91/ I163.9  History:         Patient has no prior history of Echocardiogram examinations.  Sonographer:     Arville Go RDCS Referring Phys:  [place] Diagnosing Phys: Bartholome Bill MD IMPRESSIONS  1. Left ventricular ejection fraction, by visual estimation, is 55 to 60%. The left ventricle has normal function. Left ventricular septal wall thickness was mildly increased. There is borderline left ventricular hypertrophy.  2. The left ventricle has no regional wall motion abnormalities.  3. Global right ventricle has normal systolic function.The right ventricular size is normal. No increase in right ventricular wall thickness.  4. Left atrial size was normal.  5.  Right atrial size was normal.  6. The mitral valve is grossly normal. Trivial mitral valve regurgitation.  7. The tricuspid valve is grossly normal.  8. The aortic valve is tricuspid. Aortic valve regurgitation is not visualized.  9. The pulmonic valve was grossly normal. Pulmonic valve regurgitation is not visualized. 10. The atrial septum is grossly normal. FINDINGS  Left Ventricle: Left ventricular ejection fraction, by visual estimation, is 55 to 60%. The left ventricle has normal function. The left ventricle has no regional wall motion abnormalities. There is borderline left ventricular hypertrophy. Right Ventricle: The right ventricular size is normal. No increase in right ventricular wall thickness. Global RV systolic function is has normal systolic function. Left Atrium: Left atrial size was normal in size. Right Atrium: Right atrial size was normal in size Pericardium: There is no evidence of pericardial effusion. Mitral Valve: The mitral valve is grossly normal. Trivial mitral valve regurgitation. Tricuspid Valve: The tricuspid valve is grossly normal. Tricuspid valve regurgitation is mild. Aortic Valve: The aortic valve is tricuspid. Aortic valve regurgitation is not visualized. Aortic valve peak gradient measures 8.6 mmHg. Pulmonic Valve: The pulmonic valve was grossly normal. Pulmonic valve regurgitation is not visualized. Pulmonic regurgitation is not visualized. Aorta: The aortic root is normal in size and structure. IAS/Shunts: The atrial septum is grossly normal.  LEFT VENTRICLE PLAX 2D LVIDd:         4.29 cm  Diastology LVIDs:         3.05 cm  LV e' lateral:   7.29 cm/s LV PW:         1.23 cm  LV E/e' lateral: 9.5 LV IVS:        1.21 cm  LV e' medial:    7.51 cm/s LVOT diam:     2.30 cm  LV E/e' medial:  9.2 LV SV:         46 ml LV SV Index:   19.43 LVOT Area:     4.15 cm  RIGHT VENTRICLE RV Basal diam:  3.09 cm RV S prime:     11.40 cm/s TAPSE (M-mode): 2.1 cm LEFT ATRIUM           Index       RIGHT ATRIUM  Index LA diam:      2.30 cm 0.99 cm/m RA Area:     15.70 cm LA Vol (A2C): 18.9 ml 8.13 ml/m RA Volume:   41.10 ml  17.69 ml/m LA Vol (A4C): 12.0 ml 5.16 ml/m  AORTIC VALVE                PULMONIC VALVE AV Area (Vmax): 2.46 cm    PV Vmax:       1.03 m/s AV Vmax:        147.00 cm/s PV Peak grad:  4.2 mmHg AV Peak Grad:   8.6 mmHg LVOT Vmax:      87.20 cm/s LVOT Vmean:     57.100 cm/s LVOT VTI:       0.166 m  AORTA Ao Root diam: 3.70 cm Ao Asc diam:  3.30 cm MV E velocity: 69.40 cm/s 103 cm/s  TRICUSPID VALVE MV A velocity: 70.30 cm/s 70.3 cm/s TV Peak grad:   36.2 mmHg MV E/A ratio:  0.99       1.5       TV Vmax:        3.01 m/s                                      SHUNTS                                     Systemic VTI:  0.17 m                                     Systemic Diam: 2.30 cm  Bartholome Bill MD Electronically signed by Bartholome Bill MD Signature Date/Time: 12/30/2019/8:26:01 AM    Final    DG Hip Port Unilat With Pelvis 1V Right  Result Date: 12/28/2019 CLINICAL DATA:  79 year old male with a history of total hip replacement EXAM: DG HIP (WITH OR WITHOUT PELVIS) 1V PORT RIGHT COMPARISON:  12/27/2019 FINDINGS: Interval surgical changes of right hip arthroplasty. Gas and swelling at the surgical bed. Surgical staples project within the soft tissues. Urinary catheter in position. Left hip with degenerative changes.  Bony pelvic ring intact. IMPRESSION: Early surgical changes of right hip arthroplasty without complicating features. Urinary catheter. Electronically Signed   By: Corrie Mckusick D.O.   On: 12/28/2019 11:30   DG Hip Unilat W or Wo Pelvis 2-3 Views Right  Result Date: 12/27/2019 CLINICAL DATA:  Fall. EXAM: DG HIP (WITH OR WITHOUT PELVIS) 2-3V RIGHT COMPARISON:  No recent. FINDINGS: Diffuse severe osteopenia and degenerative change. Angulated fracture of the right femoral neck is noted. No evidence of dislocation. IMPRESSION: 1.  Angulated right femoral neck fracture. 2.   Diffuse severe osteopenia degenerative change. Electronically Signed   By: Marcello Moores  Register   On: 12/27/2019 16:16    Time Spent in minutes 35   Berle Mull M.D on 01/03/2020 at 8:06 PM

## 2020-01-03 NOTE — Progress Notes (Signed)
Physical Therapy Treatment Patient Details Name: Grant Mcdaniel MRN: [id number] DOB: 02/13/1941 Today's Date: 01/03/2020    History of Present Illness Patient is a 79 year old male who was admitted to [facility] for a R hip hemiarthroplasty secondary to a right femoral neck fracture. Pt. had a cerbellar infarct. Pt. PMHx includes: CAD, CKD, DM with CKD stage III, Hypercholesterolemia, HTN, hypothyroid, PVD, and falls.    PT Comments    Pt is making good progress towards goals with improved ambulation distance this date. Still requires +2 for safety. Able to state 3/3 hip precautions and perform there-ex this date. No tremors noted this date. Improved balance and motor control. Will continue to progress.   Follow Up Recommendations  SNF     Equipment Recommendations  Rolling walker with 5" wheels;3in1 (PT)    Recommendations for Other Services       Precautions / Restrictions Precautions Precautions: Posterior Hip;Fall Precaution Booklet Issued: Yes (comment) Precaution Comments: WBAT Restrictions Weight Bearing Restrictions: Yes RLE Weight Bearing: Weight bearing as tolerated Other Position/Activity Restrictions: New CVA following Hip surgery    Mobility  Bed Mobility Overal bed mobility: Needs Assistance Bed Mobility: Supine to Sit     Supine to sit: Mod assist;+2 for safety/equipment     General bed mobility comments: Improved technique from previous session. Able to follow commands and able to reach with hands for railing. Once seated at EOB, able to maintain position with supervision  Transfers Overall transfer level: Needs assistance Equipment used: Rolling walker (2 wheeled) Transfers: Sit to/from Stand Sit to Stand: Mod assist         General transfer comment: Safe technique, required blocking B feet prior to standing. Cues for hand placement.  Ambulation/Gait Ambulation/Gait assistance: Min assist;+2 safety/equipment Gait Distance (Feet): 50 Feet Assistive  device: Rolling walker (2 wheeled) Gait Pattern/deviations: Step-through pattern     General Gait Details: able to ambulate out into hallway this date. Follows commands well for turns and maintaing hip precautions. Improved ability to keep head in forward direction. Symmetrical step pattern   Stairs             Wheelchair Mobility    Modified Rankin (Stroke Patients Only)       Balance Overall balance assessment: Needs assistance Sitting-balance support: Feet unsupported;No upper extremity supported Sitting balance-Leahy Scale: Good Sitting balance - Comments: upright posture   Standing balance support: Bilateral upper extremity supported Standing balance-Leahy Scale: Fair                              Cognition Arousal/Alertness: Awake/alert Behavior During Therapy: WFL for tasks assessed/performed Overall Cognitive Status: Within Functional Limits for tasks assessed                                        Exercises Other Exercises Other Exercises: seated/supine ther-ex performed including B LE LAQ, AP, quad sets, and hip abd/add. 12 reps with cga.    General Comments        Pertinent Vitals/Pain Pain Assessment: No/denies pain    Home Living                      Prior Function            PT Goals (current goals can now be found in the care plan section)  Acute Rehab PT Goals Patient Stated Goal: To regain independence PT Goal Formulation: With patient Time For Goal Achievement: 01/12/20 Potential to Achieve Goals: Fair Progress towards PT goals: Progressing toward goals    Frequency    BID      PT Plan Current plan remains appropriate    Co-evaluation              AM-PAC PT "6 Clicks" Mobility   Outcome Measure  Help needed turning from your back to your side while in a flat bed without using bedrails?: A Lot Help needed moving from lying on your back to sitting on the side of a flat bed without  using bedrails?: A Lot Help needed moving to and from a bed to a chair (including a wheelchair)?: A Little Help needed standing up from a chair using your arms (e.g., wheelchair or bedside chair)?: A Little Help needed to walk in hospital room?: A Little Help needed climbing 3-5 steps with a railing? : A Lot 6 Click Score: 15    End of Session Equipment Utilized During Treatment: Gait belt Activity Tolerance: Patient tolerated treatment well Patient left: in chair;with chair alarm set Nurse Communication: Mobility status PT Visit Diagnosis: Unsteadiness on feet (R26.81);Other abnormalities of gait and mobility (R26.89);Muscle weakness (generalized) (M62.81);History of falling (Z91.81);Difficulty in walking, not elsewhere classified (R26.2)     Time: [id number] PT Time Calculation (min) (ACUTE ONLY): 30 min  Charges:  $Gait Training: 8-22 mins $Therapeutic Exercise: 8-22 mins                     Grant Mcdaniel, PT, DPT [phone]    Grant Mcdaniel 01/03/2020, 11:46 AM

## 2020-01-04 LAB — GLUCOSE, CAPILLARY
Glucose-Capillary: 118 mg/dL — ABNORMAL HIGH (ref 70–99)
Glucose-Capillary: 155 mg/dL — ABNORMAL HIGH (ref 70–99)
Glucose-Capillary: 116 mg/dL — ABNORMAL HIGH (ref 70–99)
Glucose-Capillary: 125 mg/dL — ABNORMAL HIGH (ref 70–99)

## 2020-01-04 LAB — CBC WITH DIFFERENTIAL/PLATELET
Abs Immature Granulocytes: 0.23 10*3/uL — ABNORMAL HIGH (ref 0.00–0.07)
Basophils Absolute: 0.1 10*3/uL (ref 0.0–0.1)
Basophils Relative: 0 %
Eosinophils Absolute: 0.2 10*3/uL (ref 0.0–0.5)
Eosinophils Relative: 1 %
HCT: 24.9 % — ABNORMAL LOW (ref 39.0–52.0)
Hemoglobin: 8.3 g/dL — ABNORMAL LOW (ref 13.0–17.0)
Immature Granulocytes: 1 %
Lymphocytes Relative: 5 %
Lymphs Abs: 1 10*3/uL (ref 0.7–4.0)
MCH: 29 pg (ref 26.0–34.0)
MCHC: 33.3 g/dL (ref 30.0–36.0)
MCV: 87.1 fL (ref 80.0–100.0)
Monocytes Absolute: 0.8 10*3/uL (ref 0.1–1.0)
Monocytes Relative: 4 %
Neutro Abs: 18.4 10*3/uL — ABNORMAL HIGH (ref 1.7–7.7)
Neutrophils Relative %: 89 %
Platelets: 179 10*3/uL (ref 150–400)
RBC: 2.86 MIL/uL — ABNORMAL LOW (ref 4.22–5.81)
RDW: 14.4 % (ref 11.5–15.5)
WBC: 20.6 10*3/uL — ABNORMAL HIGH (ref 4.0–10.5)
nRBC: 0 % (ref 0.0–0.2)

## 2020-01-04 LAB — COMPREHENSIVE METABOLIC PANEL
ALT: 36 U/L (ref 0–44)
AST: 45 U/L — ABNORMAL HIGH (ref 15–41)
Albumin: 2.6 g/dL — ABNORMAL LOW (ref 3.5–5.0)
Alkaline Phosphatase: 187 U/L — ABNORMAL HIGH (ref 38–126)
Anion gap: 10 (ref 5–15)
BUN: 48 mg/dL — ABNORMAL HIGH (ref 8–23)
CO2: 22 mmol/L (ref 22–32)
Calcium: 9.7 mg/dL (ref 8.9–10.3)
Chloride: 105 mmol/L (ref 98–111)
Creatinine, Ser: 1.35 mg/dL — ABNORMAL HIGH (ref 0.61–1.24)
GFR calc Af Amer: 58 mL/min — ABNORMAL LOW (ref >60)
GFR calc non Af Amer: 50 mL/min — ABNORMAL LOW (ref >60)
Glucose, Bld: 120 mg/dL — ABNORMAL HIGH (ref 70–99)
Potassium: 3.5 mmol/L (ref 3.5–5.1)
Sodium: 137 mmol/L (ref 135–145)
Total Bilirubin: 2.7 mg/dL — ABNORMAL HIGH (ref 0.3–1.2)
Total Protein: 5.9 g/dL — ABNORMAL LOW (ref 6.5–8.1)

## 2020-01-04 LAB — URINE CULTURE: Culture: <10000 — AB

## 2020-01-04 LAB — PROCALCITONIN: Procalcitonin: 72.71 ng/mL

## 2020-01-04 MED ORDER — SODIUM CHLORIDE 0.9 % IV SOLN
2.0000 g | Freq: Three times a day (TID) | INTRAVENOUS | Status: DC
  Administered 2020-01-04: 2 g via INTRAVENOUS
  Filled 2020-01-04 (×4): qty 2

## 2020-01-04 MED ORDER — SODIUM CHLORIDE 0.9 % IV SOLN
INTRAVENOUS | Status: DC | PRN
  Administered 2020-01-04: 250 mL via INTRAVENOUS

## 2020-01-04 NOTE — Progress Notes (Signed)
Physical Therapy Treatment Patient Details Name: Grant Mcdaniel MRN: [id number] DOB: 05/05/1941 Today's Date: 01/04/2020    History of Present Illness Patient is a 79 year old male who was admitted to [facility] for a R hip hemiarthroplasty secondary to a right femoral neck fracture. Pt. had a cerbellar infarct. Pt. PMHx includes: CAD, CKD, DM with CKD stage III, Hypercholesterolemia, HTN, hypothyroid, PVD, and falls.    PT Comments    Pt is making good progress towards goals with ability to ambulate in hallway this date. Follows commands well and able to recite 3/3 hip precautions. Good endurance with there-ex. Limited pain today. Will continue to progress as able.   Follow Up Recommendations  SNF     Equipment Recommendations  Rolling walker with 5" wheels;3in1 (PT)    Recommendations for Other Services       Precautions / Restrictions Precautions Precautions: Posterior Hip;Fall Precaution Booklet Issued: Yes (comment) Precaution Comments: WBAT Restrictions Weight Bearing Restrictions: Yes RLE Weight Bearing: Weight bearing as tolerated    Mobility  Bed Mobility Overal bed mobility: Needs Assistance Bed Mobility: Supine to Sit     Supine to sit: Min assist     General bed mobility comments: needs assist for B LE management. Once seated at EOB, able to scoot out towards EOB with cues for maintaining hip precautions.  Transfers Overall transfer level: Needs assistance Equipment used: Rolling walker (2 wheeled) Transfers: Sit to/from Stand Sit to Stand: Min assist         General transfer comment: bed elevated with cues for hand placement. Once standing, upright posture noted  Ambulation/Gait Ambulation/Gait assistance: Min assist Gait Distance (Feet): 60 Feet Assistive device: Rolling walker (2 wheeled) Gait Pattern/deviations: Step-through pattern     General Gait Details: ambulated in hallway with reciprocal gait pattern. Safe technique performed.     Stairs             Wheelchair Mobility    Modified Rankin (Stroke Patients Only)       Balance Overall balance assessment: Needs assistance Sitting-balance support: Feet unsupported;No upper extremity supported Sitting balance-Leahy Scale: Good Sitting balance - Comments: upright posture   Standing balance support: Bilateral upper extremity supported Standing balance-Leahy Scale: Good                              Cognition Arousal/Alertness: Awake/alert Behavior During Therapy: WFL for tasks assessed/performed Overall Cognitive Status: Within Functional Limits for tasks assessed                                        Exercises Other Exercises Other Exercises: supine ther-ex performed including B LE SAQ, AP, quad sets, and hip abd/add. 15 reps with cga.    General Comments        Pertinent Vitals/Pain Pain Assessment: 0-10 Pain Score: 1  Pain Location: R hip pain Pain Descriptors / Indicators: Operative site guarding Pain Intervention(s): Limited activity within patient's tolerance    Home Living                      Prior Function            PT Goals (current goals can now be found in the care plan section) Acute Rehab PT Goals Patient Stated Goal: To regain independence PT Goal Formulation: With patient Time For  Goal Achievement: 01/12/20 Potential to Achieve Goals: Fair Progress towards PT goals: Progressing toward goals    Frequency    BID      PT Plan Current plan remains appropriate    Co-evaluation              AM-PAC PT "6 Clicks" Mobility   Outcome Measure  Help needed turning from your back to your side while in a flat bed without using bedrails?: A Little Help needed moving from lying on your back to sitting on the side of a flat bed without using bedrails?: A Little Help needed moving to and from a bed to a chair (including a wheelchair)?: A Little Help needed standing up from a  chair using your arms (e.g., wheelchair or bedside chair)?: A Little Help needed to walk in hospital room?: A Little Help needed climbing 3-5 steps with a railing? : A Lot 6 Click Score: 17    End of Session Equipment Utilized During Treatment: Gait belt Activity Tolerance: Patient tolerated treatment well Patient left: in chair;with chair alarm set Nurse Communication: Mobility status PT Visit Diagnosis: Unsteadiness on feet (R26.81);Other abnormalities of gait and mobility (R26.89);Muscle weakness (generalized) (M62.81);History of falling (Z91.81);Difficulty in walking, not elsewhere classified (R26.2)     Time: [id number] PT Time Calculation (min) (ACUTE ONLY): 31 min  Charges:  $Gait Training: 8-22 mins $Therapeutic Exercise: 8-22 mins                     Greggory Stallion, PT, DPT [phone]    Jhovani Griswold 01/04/2020, 2:12 PM

## 2020-01-04 NOTE — Progress Notes (Signed)
Pharmacy Antibiotic Note  Grant Mcdaniel is a 79 y.o. male admitted on 12/27/2019. Patient being followed by urology. SCr continues to rise, but with improvement in hematuria. Pharmacy has been consulted cefepime dosing.  BCx 2 of 4 positive for GNR. BCID showing Proteus species. Antibiotics were changed to ceftriaxone over night. Dr. Posey Pronto would like antibiotics changed back to cefepime since infection could be procedure related.   Scr trending improvement 1.8>1.72>1.35  CrCl 59.5 ml/min  Plan: Change dosing to Cefepime 2 g IV q8h based on current renal function/trend  Height: 6\' 2"  (188 cm) Weight: 234 lb (106.1 kg) IBW/kg (Calculated) : 82.2  Temp (24hrs), Avg:98 F (36.7 C), Min:97.4 F (36.3 C), Max:98.7 F (37.1 C)  Recent Labs  Lab 12/31/19 0502 12/31/19 0502 01/01/20 0800 01/01/20 1015 01/02/20 0459 01/02/20 0906 01/03/20 0418 01/04/20 0856  WBC 11.7*   < > 3.0*  --  39.5* 37.5* 26.6* 20.6*  CREATININE 1.08  --   --  1.61*  --  1.80* 1.72* 1.35*   < > = values in this interval not displayed.    Estimated Creatinine Clearance: 59.5 mL/min (A) (by C-G formula based on SCr of 1.35 mg/dL (H)).    Allergies  Allergen Reactions  . Sulfa Antibiotics Nausea And Vomiting    Antimicrobials this admission: Vancomycin 1/13 >> 1/14 Cefepime 1/13 >>   Dose adjustments this admission: NA  Microbiology results: 1/13 BCx: 2 of 4 GNR 1/13 BCID: Proteus vulgaris, susc pending  1/13 UCx: ordered   Thank you for allowing pharmacy to be a part of this patient's care.  Lu Duffel, PharmD, BCPS Clinical Pharmacist 01/04/2020 2:49 PM

## 2020-01-04 NOTE — Progress Notes (Signed)
PROGRESS NOTE                                                                                                                                                                                                             Patient Demographics:    Gerrit Schreiner, is a 79 y.o. male, DOB - 1941/07/15, XO:[id number]  Admit date - 12/27/2019   Admitting Physician Loletha Grayer, MD  Outpatient Primary MD for the patient is [facility]  LOS - 8  Outpatient Specialists: None  Chief Complaint  Patient presents with   Fall       Brief Narrative 79 year old male with history of coronary artery disease, diabetes mellitus, hypertension, hypothyroidism, PVD, chronic kidney disease stage IIIa presented to the hospital after tripping on his driveway and fell on his right side.  2 weeks back he also had a fall bruising his right eye and forehead.  At that time head CT done was negative for any bleeding or injury.  X-ray done in the ED showed right hip fracture.  Patient admitted to hospital service and underwent right hip hemiarthroplasty on 1/8. On 1/9, when physical therapist was evaluating the patient she noticed patient to have poor coordination in his left leg.  MRI brain was done showing acute small right cerebellar stroke.  Subjective:   No nausea no vomiting no fever no chills.  No chest pain abdomen.  Has some fatigue.   Assessment  & Plan :   Principal problems:   Closed hip fracture requiring operative repair, right, sequela Pain better controlled.  Continue as needed Vicodin and Robaxin.  (Monitor closely given acute confusion) Continue bowel regimen.  Orthopedics recommends Lovenox for DVT prophylaxis  Acute right cerebellar stroke ([place]) Recent fall at home hitting her right side of the head 2 weeks back.  Noted for poor left leg coordination during PT evaluation postop. MRI shows right cerebellar  stroke.  Suspected to be due to small vessel disease. 2D echo with normal EF and no wall motion abnormality or thrombus.  Carotid ultrasound without significant stenosis.  Patient chronically on Plavix for PVD.  Neurology recommended dual antiplatelet therapy with aspirin and Plavix,  Continue statin (LDL of 57). I discussed with neurology and given active hematuria, recommends using aspirin alone for now.  Back on 01/04/2020. CT angiogram head and neck  done negative for vertebral artery stenosis.  Also recommends 30-day Holter monitoring, to rule out possible A. fib contributing to an embolic stroke.  Gram-negative sepsis. Proteus bacteremia Patient's temperature was elevated.  Leukocytosis was significantly worsening. Significant bandemia seen as well. Patient's blood cultures were performed.  Which are positive for Proteus as well as Enterobacteriaceae Urine cultures were performed. Chest x-ray was performed which did not show any pneumonia. Patient started on IV vancomycin and cefepime for broad-spectrum coverage We will continue the cefepime and monitor response. IV fluid with LR.  Active problems Acute metabolic encephalopathy (0000000) Unclear etiology.  Head CT negative for acute findings.  Chest x-ray negative for infiltrate.  Patient afebrile.  Minimize narcotic and benzo for now.  Will check UA to rule out UTI.    Complication of Foley catheter [facility]) Patient has BPH on Flomax.  Patient had Foley placed in with balloon blown up into the prostate.  It was removed and urology placed a new catheter and recommends it to be in for 1 week. Noted for persistent hematuria past 72 hours.  Urology consulted again who evaluated the patient on 1/11 and feels that Foley may be irritating the prostate and causing ongoing bleeding.  Recommended irrigating catheter overnight and voiding trial this morning.   Acute postoperative blood loss anemia Hemoglobin dropped to 7.7 with persistent hematuria.   Received 1 unit PRBC.  Aspirin Plavix on hold.  H&H currently stable.  Pending stool for Hemoccult.  If H&H stable and no further hematuria will resume full dose aspirin.  Hypothyroidism Continue Synthroid  History of CAD and peripheral vascular disease Continue statin.  Plavix on hold for hematuria.  Essential hypertension Stable.  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  Continue sliding scale coverage     Code Status : DNR  Family Communication  : None  Disposition Plan  : SNF possibly in the next 48 hours sepsis improves  Barriers For Discharge : Active symptoms (hematuria, acute encephalopathy)  Consults  : Orthopedic, neurology, urology  Procedures  : Right hemiarthroplasty, MRI brain  DVT Prophylaxis  :  Lovenox -  Lab Results  Component Value Date   PLT 179 01/04/2020    Antibiotics    Anti-infectives (From admission, onward)   Start     Dose/Rate Route Frequency Ordered Stop   01/04/20 2200  ceFEPIme (MAXIPIME) 2 g in sodium chloride 0.9 % 100 mL IVPB     2 g 200 mL/hr over 30 Minutes Intravenous Every 8 hours 01/04/20 1451     01/03/20 0800  ceFEPIme (MAXIPIME) 2 g in sodium chloride 0.9 % 100 mL IVPB  Status:  Discontinued     2 g 200 mL/hr over 30 Minutes Intravenous Every 12 hours 01/03/20 0728 01/04/20 1451   01/03/20 0500  cefTRIAXone (ROCEPHIN) 2 g in sodium chloride 0.9 % 100 mL IVPB  Status:  Discontinued     2 g 200 mL/hr over 30 Minutes Intravenous Every 24 hours 01/03/20 0454 01/03/20 0720   01/03/20 0445  cefTRIAXone (ROCEPHIN) 2,000 mg in sodium chloride 0.9 % 100 mL IVPB  Status:  Discontinued     2,000 mg 200 mL/hr over 30 Minutes Intravenous Every 24 hours 01/03/20 0446 01/03/20 0454   01/02/20 1700  vancomycin (VANCOREADY) IVPB 2000 mg/400 mL     2,000 mg 200 mL/hr over 120 Minutes Intravenous  Once 01/02/20 1536 01/02/20 1932   01/02/20 1700  ceFEPIme (MAXIPIME) 2 g  in sodium  chloride 0.9 % 100 mL IVPB  Status:  Discontinued     2 g 200 mL/hr over 30 Minutes Intravenous Every 12 hours 01/02/20 1536 01/03/20 0444   01/02/20 1548  vancomycin variable dose per unstable renal function (pharmacist dosing)  Status:  Discontinued      Does not apply See admin instructions 01/02/20 1548 01/03/20 0454   12/28/19 1400  ceFAZolin (ANCEF) IVPB 1 g/50 mL premix     1 g 100 mL/hr over 30 Minutes Intravenous Every 6 hours 12/28/19 1151 12/29/19 1900   12/28/19 0747  ceFAZolin (ANCEF) 2-4 GM/100ML-% IVPB    Note to Pharmacy: Norton Blizzard  : cabinet override      12/28/19 0747 12/28/19 0827   12/27/19 2019  ceFAZolin (ANCEF) IVPB 2g/100 mL premix     2 g 200 mL/hr over 30 Minutes Intravenous 30 min pre-op 12/27/19 2019 12/28/19 0835        Objective:   Vitals:   01/03/20 1548 01/03/20 2331 01/04/20 0800 01/04/20 1554  BP: (!) 115/53 140/61 (!) 147/56 (!) 168/81  Pulse: 70 81 81 69  Resp: 17 18 17 17   Temp: 98.7 F (37.1 C) (!) 97.4 F (36.3 C) 97.8 F (36.6 C) 98 F (36.7 C)  TempSrc: Oral Oral Oral Oral  SpO2: 96% 95% 96% 99%  Weight:      Height:        Wt Readings from Last 3 Encounters:  12/27/19 106.1 kg  12/27/19 106.1 kg  12/17/19 104.3 kg     Intake/Output Summary (Last 24 hours) at 01/04/2020 1852 Last data filed at 01/04/2020 1358 Gross per 24 hour  Intake 10 ml  Output 2100 ml  Net -2090 ml   General: alert and oriented to time, place, and person. Appear in mild distress, affect appropriate Eyes: PERRL, Conjunctiva normal ENT: Oral Mucosa Clear, moist  Neck: no JVD, no Abnormal Mass Or lumps Cardiovascular: S1 and S2 Present, no Murmur, peripheral pulses symmetrical Respiratory: good respiratory effort, Bilateral Air entry equal and Decreased, no signs of accessory muscle use, Clear to Auscultation, no Crackles, no wheezes Abdomen: Bowel Sound present, Soft and no tenderness, no hernia Skin: no rashes  Extremities: no Pedal edema, no  calf tenderness Neurologic: without any new focal findings Gait not checked due to patient safety concerns      Data Review:    CBC Recent Labs  Lab 01/01/20 0800 01/02/20 0459 01/02/20 0906 01/03/20 0418 01/04/20 0856  WBC 3.0* 39.5* 37.5* 26.6* 20.6*  HGB 10.5* 8.2* 8.3* 7.8* 8.3*  HCT 32.3* 24.4* 24.9* 23.2* 24.9*  PLT 152 177 179 155 179  MCV 88.7 87.1 87.4 86.2 87.1  MCH 28.8 29.3 29.1 29.0 29.0  MCHC 32.5 33.6 33.3 33.6 33.3  RDW 13.9 14.2 14.4 14.3 14.4  LYMPHSABS  --   --  1.0 0.4* 1.0  MONOABS  --   --  1.5* 0.5 0.8  EOSABS  --   --  0.0 0.0 0.2  BASOSABS  --   --  0.1 0.1 0.1    Chemistries  Recent Labs  Lab 12/31/19 0502 01/01/20 1015 01/02/20 0906 01/03/20 0418 01/04/20 0856  NA 131* 133* 134* 134* 137  K 4.2 3.8 3.8 3.5 3.5  CL 98 99 98 100 105  CO2 23 20* 24 23 22   GLUCOSE 140* 172* 118* 132* 120*  BUN 29* 36* 51* 61* 48*  CREATININE 1.08 1.61* 1.80* 1.72* 1.35*  CALCIUM 9.6 9.4 9.3 9.2  9.7  MG  --   --   --  1.8  --   AST  --   --  66* 53* 45*  ALT  --   --  41 35 36  ALKPHOS  --   --  173* 191* 187*  BILITOT  --   --  4.5* 3.8* 2.7*   ------------------------------------------------------------------------------------------------------------------ No results for input(s): CHOL, HDL, LDLCALC, TRIG, CHOLHDL, LDLDIRECT in the last 72 hours.  Lab Results  Component Value Date   HGBA1C 7.3 (H) 12/27/2019   ------------------------------------------------------------------------------------------------------------------ No results for input(s): TSH, T4TOTAL, T3FREE, THYROIDAB in the last 72 hours.  Invalid input(s): FREET3 ------------------------------------------------------------------------------------------------------------------ Recent Labs    01/02/20 0906  RETICCTPCT 2.8    Coagulation profile Recent Labs  Lab 01/02/20 0906  INR 1.2    No results for input(s): DDIMER in the last 72 hours.  Cardiac Enzymes No results  for input(s): CKMB, TROPONINI, MYOGLOBIN in the last 168 hours.  Invalid input(s): CK ------------------------------------------------------------------------------------------------------------------ No results found for: BNP  Inpatient Medications  Scheduled Meds:   stroke: mapping our early stages of recovery book   Does not apply Once   aspirin EC  81 mg Oral Daily   atorvastatin  40 mg Oral q1800   Chlorhexidine Gluconate Cloth  6 each Topical Daily   docusate sodium  100 mg Oral BID   enoxaparin (LOVENOX) injection  40 mg Subcutaneous Q24H   fluticasone  1 spray Each Nare Daily   insulin aspart  0-5 Units Subcutaneous QHS   insulin aspart  0-9 Units Subcutaneous TID WC   levothyroxine  75 mcg Oral Q0600   loratadine  10 mg Oral Daily   senna  1 tablet Oral BID   tamsulosin  0.4 mg Oral QPC breakfast   Continuous Infusions:  sodium chloride 250 mL (01/04/20 1052)   ceFEPime (MAXIPIME) IV     lactated ringers 100 mL/hr at 01/03/20 [id number]   methocarbamol (ROBAXIN) IV     PRN Meds:.sodium chloride, acetaminophen, alum & mag hydroxide-simeth, bisacodyl, HYDROcodone-acetaminophen, HYDROcodone-acetaminophen, magnesium citrate, methocarbamol **OR** methocarbamol (ROBAXIN) IV, morphine injection, ondansetron **OR** ondansetron (ZOFRAN) IV, polyethylene glycol  Micro Results Recent Results (from the past 240 hour(s))  SARS CORONAVIRUS 2 (TAT 6-24 HRS) Nasopharyngeal Nasopharyngeal Swab     Status: None   Collection Time: 12/27/19  5:19 PM   Specimen: Nasopharyngeal Swab  Result Value Ref Range Status   SARS Coronavirus 2 NEGATIVE NEGATIVE Final    Comment: (NOTE) SARS-CoV-2 target nucleic acids are NOT DETECTED. The SARS-CoV-2 RNA is generally detectable in upper and lower respiratory specimens during the acute phase of infection. Negative results do not preclude SARS-CoV-2 infection, do not rule out co-infections with other pathogens, and should not be used as  the sole basis for treatment or other patient management decisions. Negative results must be combined with clinical observations, patient history, and epidemiological information. The expected result is Negative. Fact Sheet for Patients: SugarRoll.be Fact Sheet for Healthcare Providers: https://www.woods-mathews.com/ This test is not yet approved or cleared by the [place] FDA and  has been authorized for detection and/or diagnosis of SARS-CoV-2 by FDA under an Emergency Use Authorization (EUA). This EUA will remain  in effect (meaning this test can be used) for the duration of the COVID-19 declaration under Section 56 4(b)(1) of the Act, 21 U.S.C. section 360bbb-3(b)(1), unless the authorization is terminated or revoked sooner. Performed at [facility], [place] [address] [place], [place] [address]   SARS Coronavirus 2  by RT PCR (hospital order, performed in [place] hospital lab)     Status: None   Collection Time: 12/27/19 10:17 PM  Result Value Ref Range Status   SARS Coronavirus 2 NEGATIVE NEGATIVE Final    Comment: (NOTE) SARS-CoV-2 target nucleic acids are NOT DETECTED. The SARS-CoV-2 RNA is generally detectable in upper and lower respiratory specimens during the acute phase of infection. The lowest concentration of SARS-CoV-2 viral copies this assay can detect is 250 copies / mL. A negative result does not preclude SARS-CoV-2 infection and should not be used as the sole basis for treatment or other patient management decisions.  A negative result may occur with improper specimen collection / handling, submission of specimen other than nasopharyngeal swab, presence of viral mutation(s) within the areas targeted by this assay, and inadequate number of viral copies (<250 copies / mL). A negative result must be combined with clinical observations, patient history, and epidemiological information. Fact Sheet for Patients:     StrictlyIdeas.no Fact Sheet for Healthcare Providers: BankingDealers.co.za This test is not yet approved or cleared  by the [place] FDA and has been authorized for detection and/or diagnosis of SARS-CoV-2 by FDA under an Emergency Use Authorization (EUA).  This EUA will remain in effect (meaning this test can be used) for the duration of the COVID-19 declaration under Section 564(b)(1) of the Act, 21 U.S.C. section 360bbb-3(b)(1), unless the authorization is terminated or revoked sooner. Performed at [facility], [place] [place], [place] [address]   CULTURE, BLOOD (ROUTINE X 2) w Reflex to ID Panel     Status: None (Preliminary result)   Collection Time: 01/02/20  1:24 PM   Specimen: BLOOD  Result Value Ref Range Status   Specimen Description BLOOD BLOOD RIGHT HAND  Final   Special Requests   Final    BOTTLES DRAWN AEROBIC AND ANAEROBIC Blood Culture results may not be optimal due to an inadequate volume of blood received in culture bottles   Culture  Setup Time   Final    GRAM NEGATIVE RODS IN BOTH AEROBIC AND ANAEROBIC BOTTLES CRITICAL RESULT CALLED TO, READ BACK BY AND VERIFIED WITH: DAVID BESANTI AT [place] ON 01/03/20 RWW Performed at [facility], [address], [place], [place] [address]    Culture GRAM NEGATIVE RODS  Final   Report Status PENDING  Incomplete  CULTURE, BLOOD (ROUTINE X 2) w Reflex to ID Panel     Status: Abnormal (Preliminary result)   Collection Time: 01/02/20  1:38 PM   Specimen: BLOOD  Result Value Ref Range Status   Specimen Description   Final    BLOOD BLOOD LEFT HAND Performed at [facility], [address], [place], [place] [address]    Special Requests   Final    BOTTLES DRAWN AEROBIC AND ANAEROBIC Blood Culture adequate volume Performed at [facility], [address] [place], [place] [address]    Culture  Setup Time   Final    IN BOTH AEROBIC  AND ANAEROBIC BOTTLES GRAM NEGATIVE RODS CRITICAL RESULT CALLED TO, READ BACK BY AND VERIFIED WITH: DAVID BESANTI AT 0410 ON 01/03/20 RWW    Culture (A)  Final    PROTEUS VULGARIS SUSCEPTIBILITIES TO FOLLOW Performed at [facility], [place] [address], [place], [place] [address]    Report Status PENDING  Incomplete  Blood Culture ID Panel (Reflexed)     Status: Abnormal   Collection Time: 01/02/20  1:38 PM  Result Value  Ref Range Status   Enterococcus species NOT DETECTED NOT DETECTED Final   Listeria monocytogenes NOT DETECTED NOT DETECTED Final   Staphylococcus species NOT DETECTED NOT DETECTED Final   Staphylococcus aureus (BCID) NOT DETECTED NOT DETECTED Final   Streptococcus species NOT DETECTED NOT DETECTED Final   Streptococcus agalactiae NOT DETECTED NOT DETECTED Final   Streptococcus pneumoniae NOT DETECTED NOT DETECTED Final   Streptococcus pyogenes NOT DETECTED NOT DETECTED Final   Acinetobacter baumannii NOT DETECTED NOT DETECTED Final   Enterobacteriaceae species DETECTED (A) NOT DETECTED Final    Comment: Enterobacteriaceae represent a large family of gram-negative bacteria, not a single organism. CRITICAL RESULT CALLED TO, READ BACK BY AND VERIFIED WITH: [place] ON 01/03/20 RWW    Enterobacter cloacae complex NOT DETECTED NOT DETECTED Final   Escherichia coli NOT DETECTED NOT DETECTED Final   Klebsiella oxytoca NOT DETECTED NOT DETECTED Final   Klebsiella pneumoniae NOT DETECTED NOT DETECTED Final   Proteus species DETECTED (A) NOT DETECTED Final    Comment: CRITICAL RESULT CALLED TO, READ BACK BY AND VERIFIED WITH: DAVID BESANTI AT 0410 ON 01/03/20 RWW    Serratia marcescens NOT DETECTED NOT DETECTED Final   Carbapenem resistance NOT DETECTED NOT DETECTED Final   Haemophilus influenzae NOT DETECTED NOT DETECTED Final   Neisseria meningitidis NOT DETECTED NOT DETECTED Final   Pseudomonas aeruginosa NOT DETECTED NOT DETECTED Final   Candida albicans  NOT DETECTED NOT DETECTED Final   Candida glabrata NOT DETECTED NOT DETECTED Final   Candida krusei NOT DETECTED NOT DETECTED Final   Candida parapsilosis NOT DETECTED NOT DETECTED Final   Candida tropicalis NOT DETECTED NOT DETECTED Final    Comment: Performed at [facility], [address] [place], [place] [address]  Urine Culture     Status: Abnormal   Collection Time: 01/03/20  1:50 AM   Specimen: Urine, Random  Result Value Ref Range Status   Specimen Description   Final    URINE, RANDOM Performed at [facility], [address], [place], [place] [address]    Special Requests   Final    NONE Performed at [facility], [address] [place], [place] [address]    Culture (A)  Final    <10,000 COLONIES/mL INSIGNIFICANT GROWTH Performed at [facility], [place] [address], [place],  [address]    Report Status 01/04/2020 FINAL  Final    Radiology Reports CT ANGIO HEAD W OR WO CONTRAST  Result Date: 12/31/2019 CLINICAL DATA:  Stroke, follow-up EXAM: CT ANGIOGRAPHY HEAD AND NECK TECHNIQUE: Multidetector CT imaging of the head and neck was performed using the standard protocol during bolus administration of intravenous contrast. Multiplanar CT image reconstructions and MIPs were obtained to evaluate the vascular anatomy. Carotid stenosis measurements (when applicable) are obtained utilizing NASCET criteria, using the distal internal carotid diameter as the denominator. CONTRAST:  61mL OMNIPAQUE IOHEXOL 350 MG/ML SOLN COMPARISON:  12/17/2019 head CT, 12/29/2019 MRI FINDINGS: CT HEAD FINDINGS Brain: There is no acute intracranial hemorrhage. Small chronic cerebellar infarcts identified. More recent small right cerebellar infarct is better seen on MRI. Gray-white differentiation is preserved. Ventricles are stable in size. No extra-axial collection. Vascular: There is mild intracranial atherosclerotic calcification at the skull base. Skull:  Unremarkable. Sinuses: Mild mucosal thickening. Orbits: Orbits are unremarkable. Review of the MIP images confirms the above findings CTA NECK FINDINGS Aortic arch: Mild calcified plaque. Great vessel origins are patent. Right carotid system: Patent. Mild calcified plaque  at the ICA origin without measurable stenosis. Left carotid system: Patent. No measurable stenosis at the ICA origin. Vertebral arteries: Patent and codominant. Calcified plaque is present at the left vertebral artery origin with mild stenosis. Skeleton: Multilevel degenerative changes of the cervical spine. There are bulky anterior bridging osteophytes with indentation of the posterior pharynx and esophagus. Other neck: No neck mass or adenopathy. Upper chest: No apical lung mass. Review of the MIP images confirms the above findings CTA HEAD FINDINGS Anterior circulation: Intracranial internal carotid arteries patent with calcified plaque causing mild stenosis. Anterior and middle cerebral arteries are patent. Posterior circulation: Intracranial vertebral arteries are patent. Posterior inferior cerebellar arteries are patent. Basilar artery is patent. Superior cerebellar arteries are patent. Posterior cerebral arteries are patent. Venous sinuses: As permitted by contrast timing, patent. Review of the MIP images confirms the above findings IMPRESSION: No acute intracranial hemorrhage or new loss of gray-white differentiation. Recent small cerebellar infarct is better seen on MRI. No hemodynamically significant stenosis or evidence of dissection. Electronically Signed   By: Macy Mis M.D.   On: 12/31/2019 13:31   DG Elbow Complete Right  Result Date: 12/27/2019 CLINICAL DATA:  Recent fall with right elbow pain, initial encounter EXAM: RIGHT ELBOW - COMPLETE 3+ VIEW COMPARISON:  None. FINDINGS: Considerable degenerative changes of the elbow joint are noted particularly in the articulation of the humerus and ulna. Soft tissue swelling is noted  posteriorly consistent with the recent injury. No joint effusion is seen. Large olecranon spurs are seen as well as some findings suggestive of olecranon bursitis. IMPRESSION: No acute fracture is noted. Soft tissue changes are seen consistent with the given clinical history. Electronically Signed   By: Inez Catalina M.D.   On: 12/27/2019 16:16   CT HEAD WO CONTRAST  Result Date: 01/01/2020 CLINICAL DATA:  Cerebellar infarct, coronary artery disease, chronic kidney disease, type II diabetes mellitus, hypertension, increased confusion EXAM: CT HEAD WITHOUT CONTRAST TECHNIQUE: Contiguous axial images were obtained from the base of the skull through the vertex without intravenous contrast. Sagittal and coronal MPR images reconstructed from axial data set. COMPARISON:  12/31/2019 FINDINGS: Brain: Scattered motion artifacts significantly limit exam despite repeating images, patient unable to follow commands. Generalized atrophy. Stable ventricular morphology. No midline shift or mass effect. No gross hemorrhage, mass or infarct identified within limitations of motion. Vascular: Mild atherosclerotic calcifications of internal carotid arteries at skull base Skull: No gross abnormality seen Sinuses/Orbits: Small mucosal retention cyst and mild mucosal thickening in sphenoid sinus Other: N/A IMPRESSION: Exam severely limited by patient motion artifacts. Generalized atrophy without definite acute intracranial abnormalities as above. Electronically Signed   By: Lavonia Dana M.D.   On: 01/01/2020 10:08   CT Head Wo Contrast  Result Date: 12/17/2019 CLINICAL DATA:  Fall. EXAM: CT HEAD WITHOUT CONTRAST TECHNIQUE: Contiguous axial images were obtained from the base of the skull through the vertex without intravenous contrast. COMPARISON:  None. FINDINGS: Brain: No evidence of acute infarction, hemorrhage, hydrocephalus, extra-axial collection or mass lesion/mass effect. Small remote bilateral cerebellar infarcts. Mild brain  atrophy. Vascular: Atherosclerotic calcification Skull: Right forehead laceration.  No calvarial fracture. Sinuses/Orbits: No evidence of injury IMPRESSION: 1. No evidence of intracranial injury. 2. Right forehead laceration without fracture. Electronically Signed   By: Monte Fantasia M.D.   On: 12/17/2019 04:30   CT ANGIO NECK W OR WO CONTRAST  Result Date: 12/31/2019 CLINICAL DATA:  Stroke, follow-up EXAM: CT ANGIOGRAPHY HEAD AND NECK TECHNIQUE: Multidetector CT imaging of  the head and neck was performed using the standard protocol during bolus administration of intravenous contrast. Multiplanar CT image reconstructions and MIPs were obtained to evaluate the vascular anatomy. Carotid stenosis measurements (when applicable) are obtained utilizing NASCET criteria, using the distal internal carotid diameter as the denominator. CONTRAST:  24mL OMNIPAQUE IOHEXOL 350 MG/ML SOLN COMPARISON:  12/17/2019 head CT, 12/29/2019 MRI FINDINGS: CT HEAD FINDINGS Brain: There is no acute intracranial hemorrhage. Small chronic cerebellar infarcts identified. More recent small right cerebellar infarct is better seen on MRI. Gray-white differentiation is preserved. Ventricles are stable in size. No extra-axial collection. Vascular: There is mild intracranial atherosclerotic calcification at the skull base. Skull: Unremarkable. Sinuses: Mild mucosal thickening. Orbits: Orbits are unremarkable. Review of the MIP images confirms the above findings CTA NECK FINDINGS Aortic arch: Mild calcified plaque. Great vessel origins are patent. Right carotid system: Patent. Mild calcified plaque at the ICA origin without measurable stenosis. Left carotid system: Patent. No measurable stenosis at the ICA origin. Vertebral arteries: Patent and codominant. Calcified plaque is present at the left vertebral artery origin with mild stenosis. Skeleton: Multilevel degenerative changes of the cervical spine. There are bulky anterior bridging osteophytes  with indentation of the posterior pharynx and esophagus. Other neck: No neck mass or adenopathy. Upper chest: No apical lung mass. Review of the MIP images confirms the above findings CTA HEAD FINDINGS Anterior circulation: Intracranial internal carotid arteries patent with calcified plaque causing mild stenosis. Anterior and middle cerebral arteries are patent. Posterior circulation: Intracranial vertebral arteries are patent. Posterior inferior cerebellar arteries are patent. Basilar artery is patent. Superior cerebellar arteries are patent. Posterior cerebral arteries are patent. Venous sinuses: As permitted by contrast timing, patent. Review of the MIP images confirms the above findings IMPRESSION: No acute intracranial hemorrhage or new loss of gray-white differentiation. Recent small cerebellar infarct is better seen on MRI. No hemodynamically significant stenosis or evidence of dissection. Electronically Signed   By: Macy Mis M.D.   On: 12/31/2019 13:31   CT Cervical Spine Wo Contrast  Addendum Date: 12/17/2019   ADDENDUM REPORT: 12/17/2019 09:36 ADDENDUM: Correction to impression #2, the second sentence should read in the upper CERVICAL levels there is also posterior longitudinal ligament ossification. Electronically Signed   By: Monte Fantasia M.D.   On: 12/17/2019 09:36   Result Date: 12/17/2019 CLINICAL DATA:  Fall with laceration. EXAM: CT CERVICAL SPINE WITHOUT CONTRAST TECHNIQUE: Multidetector CT imaging of the cervical spine was performed without intravenous contrast. Multiplanar CT image reconstructions were also generated. COMPARISON:  None. FINDINGS: Alignment: Normal Skull base and vertebrae: Negative for fracture. 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 There is ankylosis from the occiput to T2. 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 bone island in the C2 body. Soft tissues and spinal canal: No  prevertebral fluid or swelling. No visible canal hematoma. Lipoma in the posterior left para median neck the even more simple internal architecture than adjacent fat, up to 3 cm on axial slices. Disc levels:  Spondylitic changes as noted above. Upper chest: Negative IMPRESSION: 1. No acute finding. 2. 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 In the upper thoracic levels there is also posterior longitudinal ligament ossification. Electronically Signed: By: Monte Fantasia M.D. On: 12/17/2019 09:04   MR BRAIN WO CONTRAST  Result Date: 12/29/2019 CLINICAL DATA:  Acute neuro deficit, stroke suspected. Left leg symptoms. Recent fall sustaining a right femoral neck fracture  treated with arthroplasty. EXAM: MRI HEAD WITHOUT CONTRAST TECHNIQUE: Multiplanar, multiecho pulse sequences of the brain and surrounding structures were obtained without intravenous contrast. COMPARISON:  Head CT 12/17/2019 FINDINGS: Brain: There is a 6 mm acute right cerebellar infarct. Small chronic infarcts are present in the cerebellum bilaterally. No intracranial hemorrhage, mass, midline shift, or extra-axial fluid collection is identified. There is moderate cerebral atrophy. A few small foci of T2 hyperintensity in the cerebral white matter are nonspecific and not considered abnormal for age. Vascular: Major intracranial vascular flow voids are preserved. Skull and upper cervical spine: Unremarkable bone marrow signal. Prominent ligamentous thickening posterior to the dens without significant mass effect on the cervicomedullary junction. Sinuses/Orbits: Unremarkable orbits. Small right maxillary sinus mucous retention cyst. Mild scattered mucosal thickening in the paranasal sinuses, greatest in the left sphenoid sinus. Clear mastoid air cells. Other: None. IMPRESSION: 1. Small acute right cerebellar infarct. 2. Chronic bilateral cerebellar infarcts. Electronically Signed   By: Logan Bores M.D.   On:  12/29/2019 11:48   CT ABDOMEN PELVIS W CONTRAST  Result Date: 12/28/2019 CLINICAL DATA:  Abdominal trauma with gross hematuria. EXAM: CT ABDOMEN AND PELVIS WITH CONTRAST TECHNIQUE: Multidetector CT imaging of the abdomen and pelvis was performed using the standard protocol following bolus administration of intravenous contrast. CONTRAST:  81mL OMNIPAQUE IOHEXOL 300 MG/ML  SOLN COMPARISON:  None. FINDINGS: LOWER CHEST: No basilar pleural or apical pericardial effusion. HEPATOBILIARY: Normal hepatic contours. No intra- or extrahepatic biliary dilatation. Status post cholecystectomy. PANCREAS: Normal pancreas. No ductal dilatation or peripancreatic fluid collection. SPLEEN: Normal. ADRENALS/URINARY TRACT: The adrenal glands are normal. There is right-sided retroperitoneal stranding tracking from the right flank along the course of the right ureter, most evident at the level of the right common iliac bifurcation. The left ureter is normal. There is calcification and scarring of the left kidney upper pole. The urinary bladder is normal for degree of distention. There is a Foley catheter with the balloon inflated within the proximal penile urethra. STOMACH/BOWEL: There is no hiatal hernia. Normal duodenal course and caliber. No small bowel dilatation or inflammation. No focal colonic abnormality. Surgically absent. VASCULAR/LYMPHATIC: There is calcific atherosclerosis of the abdominal aorta. No abdominal or pelvic lymphadenopathy. REPRODUCTIVE: Markedly enlarged prostate measures 6.9 x 5.9 x 6.0 cm. MUSCULOSKELETAL. There is a comminuted fracture of the right femoral neck. There are bridging osteophytes of both sacroiliac joints. There are anterior lumbar vertebral enthesophytes and lower thoracic spinous process enthesophytes. There is moderate-to-severe foraminal stenosis bilaterally at L4-5 and L5-S1. OTHER: None. IMPRESSION: 1. Right-sided retroperitoneal stranding tracking from the right flank along the course of  the right ureter, most evident at the level of the right common iliac bifurcation. Given the recent fall, this might be a sequela of trauma. Ascending urinary tract infection might also cause this appearance. 2. Foley catheter balloon is inflated within the proximal penile urethra. Removal is recommended. 3. Comminuted fracture of the right femoral neck. 4. Markedly enlarged prostate. 5. Aortic Atherosclerosis (ICD10-I70.0). Electronically Signed   By: Ulyses Jarred M.D.   On: 12/28/2019 03:42   US RENAL  Result Date: 12/30/2019 CLINICAL DATA:  Hematuria EXAM: RENAL / URINARY TRACT ULTRASOUND COMPLETE COMPARISON:  None. FINDINGS: Right Kidney: Renal measurements: 12 x 4.2 x 5.0 cm = volume: 134 mL. Mildly echogenic cortex. Left Kidney: Renal measurements: 10.9 x 4.5 x 4.9 cm = volume: 128 mL. Limited evaluation. Increased cortical echogenicity in a mildly nodular contour. Bladder: Decompressed with a Foley catheter. Other: None. IMPRESSION:  1. Mildly echogenic cortex bilaterally suggesting the possibility medical renal disease. Evaluation of the left kidney is limited. 2. The bladder is decompressed with a Foley catheter. Electronically Signed   By: Dorise Bullion III M.D   On: 12/30/2019 12:19   US Carotid Bilateral (at [facility] and [facility] only)  Result Date: 12/29/2019 CLINICAL DATA:  Acute cerebellar infarct EXAM: BILATERAL CAROTID DUPLEX ULTRASOUND TECHNIQUE: Pearline Cables scale imaging, color Doppler and duplex ultrasound were performed of bilateral carotid and vertebral arteries in the neck. COMPARISON:  12/29/2019 FINDINGS: Criteria: Quantification of carotid stenosis is based on velocity parameters that correlate the residual internal carotid diameter with NASCET-based stenosis levels, using the diameter of the distal internal carotid lumen as the denominator for stenosis measurement. The following velocity measurements were obtained: RIGHT ICA: 101/15 cm/sec CCA: 0000000 cm/sec SYSTOLIC ICA/CCA RATIO:  1.3 ECA: 133  cm/sec LEFT ICA: 98/17 cm/sec CCA: 123456 cm/sec SYSTOLIC ICA/CCA RATIO:  1.0 ECA: 127 cm/sec RIGHT CAROTID ARTERY: Minor echogenic shadowing plaque formation. No hemodynamically significant right ICA stenosis, velocity elevation, or turbulent flow. Degree of narrowing less than 50%. RIGHT VERTEBRAL ARTERY:  Antegrade LEFT CAROTID ARTERY: Similar scattered minor echogenic plaque formation. No hemodynamically significant left ICA stenosis, velocity elevation, or turbulent flow. LEFT VERTEBRAL ARTERY:  Antegrade IMPRESSION: Minor carotid atherosclerosis. No hemodynamically significant ICA stenosis. Degree of narrowing less than 50% bilaterally by ultrasound criteria. Patent antegrade vertebral flow bilaterally Electronically Signed   By: Jerilynn Mages.  Shick M.D.   On: 12/29/2019 13:55   DG Chest Port 1 View  Result Date: 01/02/2020 CLINICAL DATA:  Shortness of breath. History of coronary artery disease, diabetes and hypertension. EXAM: PORTABLE CHEST 1 VIEW COMPARISON:  Radiographs 12/27/2019 and 01/01/2020. FINDINGS: 1314 hours. The heart size and mediastinal contours are stable. There is aortic and coronary artery atherosclerosis. There is interval improved aeration of the lung bases with mild residual atelectasis. No edema, confluent airspace opacity, pneumothorax or significant pleural effusion. The bones appear unchanged. IMPRESSION: Interval improved aeration of the lung bases with mild residual atelectasis. No acute findings. Electronically Signed   By: Richardean Sale M.D.   On: 01/02/2020 14:16   DG Chest Port 1 View  Result Date: 01/01/2020 CLINICAL DATA:  Metabolic encephalopathy, acute EXAM: PORTABLE CHEST 1 VIEW COMPARISON:  12/27/2019 FINDINGS: The heart size and mediastinal contours are within normal limits. Both lungs are clear. The visualized skeletal structures are unremarkable. IMPRESSION: No active disease. Electronically Signed   By: Kerby Moors M.D.   On: 01/01/2020 10:10   DG Chest Portable 1  View  Result Date: 12/27/2019 CLINICAL DATA:  Right hip pain, hypertension EXAM: PORTABLE CHEST 1 VIEW COMPARISON:  None. FINDINGS: Heart size is mildly enlarged. Aortic atherosclerosis. Both lungs are clear. The visualized skeletal structures are unremarkable. IMPRESSION: 1. No active cardiopulmonary disease. 2. Mild cardiomegaly. 3. Aortic atherosclerosis. Electronically Signed   By: Davina Poke D.O.   On: 12/27/2019 17:44   DG Knee Left Port  Result Date: 12/28/2019 CLINICAL DATA:  Left knee pain. EXAM: PORTABLE LEFT KNEE - 1-2 VIEW COMPARISON:  None. FINDINGS: No evidence of fracture, dislocation, or joint effusion. Advanced degenerative joint disease identified with marked medial compartment narrowing, subchondral sclerosis and marginal spur formation. No fractures or dislocations. IMPRESSION: 1. Advanced degenerative joint disease. 2. No acute findings. Electronically Signed   By: Kerby Moors M.D.   On: 12/28/2019 11:28   DG Knee Right Port  Result Date: 12/28/2019 CLINICAL DATA:  Right knee pain. EXAM: PORTABLE RIGHT  KNEE - 1-2 VIEW COMPARISON:  None. FINDINGS: No acute fracture is identified on this single AP radiograph. There is severe medial compartment joint space narrowing with mild genu varus deformity. Moderate medial and lateral compartment marginal osteophytosis is noted. There is mild diffuse soft tissue swelling. IMPRESSION: 1. Severe medial compartment osteoarthrosis. 2. No acute osseous abnormality identified. Electronically Signed   By: Logan Bores M.D.   On: 12/28/2019 11:42   ECHOCARDIOGRAM COMPLETE  Result Date: 12/30/2019   ECHOCARDIOGRAM REPORT   Patient Name:   DAVIAN AGREDANO Date of Exam: 12/29/2019 Medical Rec #:  [id number]        Height:       74.0 in Accession #:    [id number]       Weight:       234.0 lb Date of Birth:  01/02/1941        BSA:          2.32 m Patient Age:    32 years         BP:           135/56 mmHg Patient Gender: M                HR:           76  bpm. Exam Location:  [facility] Procedure: 2D Echo Indications:     STROKE 434.91/ I163.9  History:         Patient has no prior history of Echocardiogram examinations.  Sonographer:     Arville Go RDCS Referring Phys:  [place] Diagnosing Phys: Bartholome Bill MD IMPRESSIONS  1. Left ventricular ejection fraction, by visual estimation, is 55 to 60%. The left ventricle has normal function. Left ventricular septal wall thickness was mildly increased. There is borderline left ventricular hypertrophy.  2. The left ventricle has no regional wall motion abnormalities.  3. Global right ventricle has normal systolic function.The right ventricular size is normal. No increase in right ventricular wall thickness.  4. Left atrial size was normal.  5. Right atrial size was normal.  6. The mitral valve is grossly normal. Trivial mitral valve regurgitation.  7. The tricuspid valve is grossly normal.  8. The aortic valve is tricuspid. Aortic valve regurgitation is not visualized.  9. The pulmonic valve was grossly normal. Pulmonic valve regurgitation is not visualized. 10. The atrial septum is grossly normal. FINDINGS  Left Ventricle: Left ventricular ejection fraction, by visual estimation, is 55 to 60%. The left ventricle has normal function. The left ventricle has no regional wall motion abnormalities. There is borderline left ventricular hypertrophy. Right Ventricle: The right ventricular size is normal. No increase in right ventricular wall thickness. Global RV systolic function is has normal systolic function. Left Atrium: Left atrial size was normal in size. Right Atrium: Right atrial size was normal in size Pericardium: There is no evidence of pericardial effusion. Mitral Valve: The mitral valve is grossly normal. Trivial mitral valve regurgitation. Tricuspid Valve: The tricuspid valve is grossly normal. Tricuspid valve regurgitation is mild. Aortic Valve: The aortic valve is tricuspid. Aortic valve regurgitation  is not visualized. Aortic valve peak gradient measures 8.6 mmHg. Pulmonic Valve: The pulmonic valve was grossly normal. Pulmonic valve regurgitation is not visualized. Pulmonic regurgitation is not visualized. Aorta: The aortic root is normal in size and structure. IAS/Shunts: The atrial septum is grossly normal.  LEFT VENTRICLE PLAX 2D LVIDd:         4.29 cm  Diastology LVIDs:  3.05 cm  LV e' lateral:   7.29 cm/s LV PW:         1.23 cm  LV E/e' lateral: 9.5 LV IVS:        1.21 cm  LV e' medial:    7.51 cm/s LVOT diam:     2.30 cm  LV E/e' medial:  9.2 LV SV:         46 ml LV SV Index:   19.43 LVOT Area:     4.15 cm  RIGHT VENTRICLE RV Basal diam:  3.09 cm RV S prime:     11.40 cm/s TAPSE (M-mode): 2.1 cm LEFT ATRIUM           Index      RIGHT ATRIUM           Index LA diam:      2.30 cm 0.99 cm/m RA Area:     15.70 cm LA Vol (A2C): 18.9 ml 8.13 ml/m RA Volume:   41.10 ml  17.69 ml/m LA Vol (A4C): 12.0 ml 5.16 ml/m  AORTIC VALVE                PULMONIC VALVE AV Area (Vmax): 2.46 cm    PV Vmax:       1.03 m/s AV Vmax:        147.00 cm/s PV Peak grad:  4.2 mmHg AV Peak Grad:   8.6 mmHg LVOT Vmax:      87.20 cm/s LVOT Vmean:     57.100 cm/s LVOT VTI:       0.166 m  AORTA Ao Root diam: 3.70 cm Ao Asc diam:  3.30 cm MV E velocity: 69.40 cm/s 103 cm/s  TRICUSPID VALVE MV A velocity: 70.30 cm/s 70.3 cm/s TV Peak grad:   36.2 mmHg MV E/A ratio:  0.99       1.5       TV Vmax:        3.01 m/s                                      SHUNTS                                     Systemic VTI:  0.17 m                                     Systemic Diam: 2.30 cm  Bartholome Bill MD Electronically signed by Bartholome Bill MD Signature Date/Time: 12/30/2019/8:26:01 AM    Final    DG Hip Port Unilat With Pelvis 1V Right  Result Date: 12/28/2019 CLINICAL DATA:  79 year old male with a history of total hip replacement EXAM: DG HIP (WITH OR WITHOUT PELVIS) 1V PORT RIGHT COMPARISON:  12/27/2019 FINDINGS: Interval surgical changes of  right hip arthroplasty. Gas and swelling at the surgical bed. Surgical staples project within the soft tissues. Urinary catheter in position. Left hip with degenerative changes.  Bony pelvic ring intact. IMPRESSION: Early surgical changes of right hip arthroplasty without complicating features. Urinary catheter. Electronically Signed   By: Corrie Mckusick D.O.   On: 12/28/2019 11:30   DG Hip Unilat W or Wo Pelvis 2-3 Views Right  Result Date: 12/27/2019 CLINICAL DATA:  Fall. EXAM: DG  HIP (WITH OR WITHOUT PELVIS) 2-3V RIGHT COMPARISON:  No recent. FINDINGS: Diffuse severe osteopenia and degenerative change. Angulated fracture of the right femoral neck is noted. No evidence of dislocation. IMPRESSION: 1.  Angulated right femoral neck fracture. 2.  Diffuse severe osteopenia degenerative change. Electronically Signed   By: Marcello Moores  Register   On: 12/27/2019 16:16    Time Spent in minutes 35   Berle Mull M.D on 01/04/2020 at 6:52 PM

## 2020-01-04 NOTE — TOC Progression Note (Signed)
Transition of Care [facility]) - Progression Note    Patient Details  Name: Grant Mcdaniel MRN: [id number] Date of Birth: 1941-09-16  Transition of Care [facility]) CM/SW Contact  Jihan Mellette, Gardiner Rhyme, LCSW Phone Number: 01/04/2020, 10:39 AM  Clinical Narrative:   According to MD pt will be medically stable for transfer to [facility] on Sunday 1/17. Have [phone] until Monday 1/18. [facility] CM-[facility] to call with continued approval.  Kelly-Admissions reports will need new COVID test. MD to order for Sat. Pt is in agreement will also contact brother to update.    Expected Discharge Plan: Skilled Nursing Facility Barriers to Discharge: Ship broker, Continued Medical Work up  Expected Discharge Plan and Services Expected Discharge Plan: [place] In-house Referral: Clinical Social Work Discharge Planning Services: NA   Living arrangements for the past 2 months: Single Family Home                                       Social Determinants of Health (SDOH) Interventions    Readmission Risk Interventions No flowsheet data found.

## 2020-01-04 NOTE — Progress Notes (Signed)
Nurse in to check on pt shortly after lunch and noted his IV site was bleeding and under the transparent dressing had leaked out some down his arm.  He stated he did not know how this happened.  Nurse cleaned up the site and then attempted to flush the IV the saline came out from under the dressing.  IV was removed and this nurse attempted to start a new IV twice and was unsuccessful.  IV team has been consulted.  Will monitor.

## 2020-01-04 NOTE — Progress Notes (Signed)
Physical Therapy Treatment Patient Details Name: Grant Mcdaniel MRN: [id number] DOB: 27-Aug-1941 Today's Date: 01/04/2020    History of Present Illness Patient is a 79 year old male who was admitted to [facility] for a R hip hemiarthroplasty secondary to a right femoral neck fracture. Pt. had a cerbellar infarct. Pt. PMHx includes: CAD, CKD, DM with CKD stage III, Hypercholesterolemia, HTN, hypothyroid, PVD, and falls.    PT Comments    Pt is making good progress towards goals with ability to participate in increasing challenging there-ex. Pt continues to be very fatigued in PM, most of progress made in AM treatments. Will continue to progress as able.  Follow Up Recommendations  SNF     Equipment Recommendations  Rolling walker with 5" wheels;3in1 (PT)    Recommendations for Other Services       Precautions / Restrictions Precautions Precautions: Posterior Hip;Fall Precaution Booklet Issued: Yes (comment) Precaution Comments: WBAT Restrictions Weight Bearing Restrictions: Yes RLE Weight Bearing: Weight bearing as tolerated Other Position/Activity Restrictions: New CVA following Hip surgery    Mobility  Bed Mobility Overal bed mobility: Needs Assistance Bed Mobility: Supine to Sit     Supine to sit: Min assist     General bed mobility comments: able to reach up for railing to slide up towards [facility] with B UE and propping L LE on bed. Min assist for repositioning. Further OOB mobility deferred secondary to fatigue  Transfers Overall transfer level: Needs assistance Equipment used: Rolling walker (2 wheeled) Transfers: Sit to/from Stand Sit to Stand: Min assist         General transfer comment: bed elevated with cues for hand placement. Once standing, upright posture noted  Ambulation/Gait Ambulation/Gait assistance: Min assist Gait Distance (Feet): 60 Feet Assistive device: Rolling walker (2 wheeled) Gait Pattern/deviations: Step-through pattern     General Gait  Details: ambulated in hallway with reciprocal gait pattern. Safe technique performed.    Stairs             Wheelchair Mobility    Modified Rankin (Stroke Patients Only)       Balance Overall balance assessment: Needs assistance Sitting-balance support: Feet unsupported;No upper extremity supported Sitting balance-Leahy Scale: Good Sitting balance - Comments: upright posture   Standing balance support: Bilateral upper extremity supported Standing balance-Leahy Scale: Good                              Cognition Arousal/Alertness: Lethargic Behavior During Therapy: WFL for tasks assessed/performed Overall Cognitive Status: Within Functional Limits for tasks assessed                                        Exercises Other Exercises Other Exercises: supine ther-ex performed including B LE SAQ, AP, quad sets, SLRs, and hip abd/add. 15 reps with cga.    General Comments        Pertinent Vitals/Pain Pain Assessment: No/denies pain Pain Score: 1  Pain Location: R hip pain Pain Descriptors / Indicators: Operative site guarding Pain Intervention(s): Limited activity within patient's tolerance    Home Living                      Prior Function            PT Goals (current goals can now be found in the care plan section) Acute  Rehab PT Goals Patient Stated Goal: To regain independence PT Goal Formulation: With patient Time For Goal Achievement: 01/12/20 Potential to Achieve Goals: Fair Progress towards PT goals: Progressing toward goals    Frequency    BID      PT Plan Current plan remains appropriate    Co-evaluation              AM-PAC PT "6 Clicks" Mobility   Outcome Measure  Help needed turning from your back to your side while in a flat bed without using bedrails?: A Little Help needed moving from lying on your back to sitting on the side of a flat bed without using bedrails?: A Little Help needed  moving to and from a bed to a chair (including a wheelchair)?: A Little Help needed standing up from a chair using your arms (e.g., wheelchair or bedside chair)?: A Little Help needed to walk in hospital room?: A Little Help needed climbing 3-5 steps with a railing? : A Lot 6 Click Score: 17    End of Session Equipment Utilized During Treatment: Gait belt Activity Tolerance: Patient tolerated treatment well Patient left: in bed;with bed alarm set Nurse Communication: Mobility status PT Visit Diagnosis: Unsteadiness on feet (R26.81);Other abnormalities of gait and mobility (R26.89);Muscle weakness (generalized) (M62.81);History of falling (Z91.81);Difficulty in walking, not elsewhere classified (R26.2)     Time: [id number] PT Time Calculation (min) (ACUTE ONLY): 15 min  Charges:  $Gait Training: 8-22 mins $Therapeutic Exercise: 8-22 mins                     Greggory Stallion, PT, DPT [phone]    [name] 01/04/2020, 4:53 PM

## 2020-01-05 LAB — BASIC METABOLIC PANEL
Anion gap: 10 (ref 5–15)
BUN: 37 mg/dL — ABNORMAL HIGH (ref 8–23)
CO2: 23 mmol/L (ref 22–32)
Calcium: 10.2 mg/dL (ref 8.9–10.3)
Chloride: 106 mmol/L (ref 98–111)
Creatinine, Ser: 1.16 mg/dL (ref 0.61–1.24)
GFR calc Af Amer: >60 mL/min (ref >60)
GFR calc non Af Amer: >60 mL/min (ref >60)
Glucose, Bld: 139 mg/dL — ABNORMAL HIGH (ref 70–99)
Potassium: 3.6 mmol/L (ref 3.5–5.1)
Sodium: 139 mmol/L (ref 135–145)

## 2020-01-05 LAB — CBC
HCT: 26.4 % — ABNORMAL LOW (ref 39.0–52.0)
Hemoglobin: 8.6 g/dL — ABNORMAL LOW (ref 13.0–17.0)
MCH: 28.6 pg (ref 26.0–34.0)
MCHC: 32.6 g/dL (ref 30.0–36.0)
MCV: 87.7 fL (ref 80.0–100.0)
Platelets: 194 10*3/uL (ref 150–400)
RBC: 3.01 MIL/uL — ABNORMAL LOW (ref 4.22–5.81)
RDW: 14.6 % (ref 11.5–15.5)
WBC: 12.8 10*3/uL — ABNORMAL HIGH (ref 4.0–10.5)
nRBC: 0 % (ref 0.0–0.2)

## 2020-01-05 LAB — CULTURE, BLOOD (ROUTINE X 2): Special Requests: ADEQUATE

## 2020-01-05 LAB — MAGNESIUM: Magnesium: 2.1 mg/dL (ref 1.7–2.4)

## 2020-01-05 LAB — GLUCOSE, CAPILLARY
Glucose-Capillary: 141 mg/dL — ABNORMAL HIGH (ref 70–99)
Glucose-Capillary: 186 mg/dL — ABNORMAL HIGH (ref 70–99)
Glucose-Capillary: 145 mg/dL — ABNORMAL HIGH (ref 70–99)
Glucose-Capillary: 130 mg/dL — ABNORMAL HIGH (ref 70–99)

## 2020-01-05 MED ORDER — SACCHAROMYCES BOULARDII 250 MG PO CAPS
250.0000 mg | ORAL_CAPSULE | Freq: Two times a day (BID) | ORAL | Status: DC
  Administered 2020-01-05 – 2020-01-06 (×3): 250 mg via ORAL
  Filled 2020-01-05 (×4): qty 1

## 2020-01-05 MED ORDER — CIPROFLOXACIN HCL 250 MG PO TABS
500.0000 mg | ORAL_TABLET | Freq: Two times a day (BID) | ORAL | Status: DC
  Administered 2020-01-05 – 2020-01-06 (×2): 500 mg via ORAL
  Filled 2020-01-05 (×2): qty 2
  Filled 2020-01-05 (×2): qty 1

## 2020-01-05 NOTE — Progress Notes (Signed)
Physical Therapy Treatment Patient Details Name: Grant Mcdaniel MRN: [id number] DOB: [birth year] Today's Date: 01/05/2020    History of Present Illness Patient is a 79 year old male who was admitted to [facility] for a R hip hemiarthroplasty secondary to a right femoral neck fracture. Pt. had a cerbellar infarct. Pt. PMHx includes: CAD, CKD, DM with CKD stage III, Hypercholesterolemia, HTN, hypothyroid, PVD, and falls.    PT Comments    Pt ready for session after breakfast.  Participated in exercises as described below.  To edge of bed with mod a x 1.  Pt with improved motor planning and ability to follow directions.  Once sitting, he is able to remains upright x 15 minutes while awaiting +2 assist for mobility.  He is able to transfer to recliner at bedside with min/mod a x 2 with high dependence on walker.  Gait is deferred at this time due to +2 availability to assist needed for safety.   Follow Up Recommendations  SNF     Equipment Recommendations  Rolling walker with 5" wheels;3in1 (PT)    Recommendations for Other Services       Precautions / Restrictions Precautions Precautions: Posterior Hip;Fall Precaution Comments: WBAT Restrictions Weight Bearing Restrictions: Yes RLE Weight Bearing: Weight bearing as tolerated Other Position/Activity Restrictions: New CVA following Hip surgery    Mobility  Bed Mobility Overal bed mobility: Needs Assistance Bed Mobility: Supine to Sit     Supine to sit: Mod assist     General bed mobility comments: improved today with better motor planning and sequencing  Transfers Overall transfer level: Needs assistance Equipment used: Rolling walker (2 wheeled) Transfers: Sit to/from [employer] Transfers Sit to Stand: +2 physical assistance;Min assist;Mod assist            Ambulation/Gait   Gait Distance (Feet): 3 Feet Assistive device: Rolling walker (2 wheeled) Gait Pattern/deviations: Step-to pattern     General Gait  Details: gait deferred at this time due to availability of assistance.   Stairs             Wheelchair Mobility    Modified Rankin (Stroke Patients Only)       Balance Overall balance assessment: Needs assistance Sitting-balance support: Feet unsupported;No upper extremity supported Sitting balance-Leahy Scale: Good Sitting balance - Comments: upright posture   Standing balance support: Bilateral upper extremity supported Standing balance-Leahy Scale: Fair Standing balance comment: Mild posterior lean with standing. Heavy BUE support on RW.                            Cognition Arousal/Alertness: Awake/alert Behavior During Therapy: WFL for tasks assessed/performed Overall Cognitive Status: Within Functional Limits for tasks assessed                                        Exercises Other Exercises Other Exercises: Supine AAROM ankle pumps,heel slides, ab/add and SLR x 10, sitting LAQ ankle pumps and marches x 10  both BLE    General Comments        Pertinent Vitals/Pain Pain Assessment: No/denies pain    Home Living                      Prior Function            PT Goals (current goals can now be found in  the care plan section) Progress towards PT goals: Progressing toward goals    Frequency    BID      PT Plan Current plan remains appropriate    Co-evaluation              AM-PAC PT "6 Clicks" Mobility   Outcome Measure  Help needed turning from your back to your side while in a flat bed without using bedrails?: A Little Help needed moving from lying on your back to sitting on the side of a flat bed without using bedrails?: A Little Help needed moving to and from a bed to a chair (including a wheelchair)?: A Little Help needed standing up from a chair using your arms (e.g., wheelchair or bedside chair)?: A Little Help needed to walk in hospital room?: A Little Help needed climbing 3-5 steps with a  railing? : A Lot 6 Click Score: 17    End of Session Equipment Utilized During Treatment: Gait belt Activity Tolerance: Patient tolerated treatment well Patient left: in chair;with chair alarm set;with call bell/phone within reach Nurse Communication: Mobility status       Time: [id number] PT Time Calculation (min) (ACUTE ONLY): 23 min  Charges:  $Therapeutic Exercise: 8-22 mins $Therapeutic Activity: 8-22 mins                     Chesley Noon, PTA 01/05/20, 10:02 AM

## 2020-01-05 NOTE — Progress Notes (Signed)
Subjective:  Patient reports pain as mild.  Patient resting comfortably.  Objective:   VITALS:   Vitals:   01/04/20 0800 01/04/20 1554 01/05/20 0052 01/05/20 0756  BP: (!) 147/56 (!) 168/81 (!) 155/64 (!) 163/54  Pulse: 81 69 73 85  Resp: 17 17 20 18   Temp: 97.8 F (36.6 C) 98 F (36.7 C) 97.6 F (36.4 C) 98.2 F (36.8 C)  TempSrc: Oral Oral Oral Oral  SpO2: 96% 99% 95% 94%  Weight:      Height:        PHYSICAL EXAM:  Neurologically intact ABD soft Neurovascular intact Sensation intact distally Intact pulses distally Dorsiflexion/Plantar flexion intact Incision: dressing C/D/I No cellulitis present Compartment soft  LABS  Results for orders placed or performed during the hospital encounter of 12/27/19 (from the past 24 hour(s))  Procalcitonin     Status: None   Collection Time: 01/04/20  8:56 AM  Result Value Ref Range   Procalcitonin 72.71 ng/mL  CBC with Differential/Platelet     Status: Abnormal   Collection Time: 01/04/20  8:56 AM  Result Value Ref Range   WBC 20.6 (H) 4.0 - 10.5 K/uL   RBC 2.86 (L) 4.22 - 5.81 MIL/uL   Hemoglobin 8.3 (L) 13.0 - 17.0 g/dL   HCT 24.9 (L) 39.0 - 52.0 %   MCV 87.1 80.0 - 100.0 fL   MCH 29.0 26.0 - 34.0 pg   MCHC 33.3 30.0 - 36.0 g/dL   RDW 14.4 11.5 - 15.5 %   Platelets 179 150 - 400 K/uL   nRBC 0.0 0.0 - 0.2 %   Neutrophils Relative % 89 %   Neutro Abs 18.4 (H) 1.7 - 7.7 K/uL   Lymphocytes Relative 5 %   Lymphs Abs 1.0 0.7 - 4.0 K/uL   Monocytes Relative 4 %   Monocytes Absolute 0.8 0.1 - 1.0 K/uL   Eosinophils Relative 1 %   Eosinophils Absolute 0.2 0.0 - 0.5 K/uL   Basophils Relative 0 %   Basophils Absolute 0.1 0.0 - 0.1 K/uL   Immature Granulocytes 1 %   Abs Immature Granulocytes 0.23 (H) 0.00 - 0.07 K/uL  Comprehensive metabolic panel     Status: Abnormal   Collection Time: 01/04/20  8:56 AM  Result Value Ref Range   Sodium 137 135 - 145 mmol/L   Potassium 3.5 3.5 - 5.1 mmol/L   Chloride 105 98 - 111  mmol/L   CO2 22 22 - 32 mmol/L   Glucose, Bld 120 (H) 70 - 99 mg/dL   BUN 48 (H) 8 - 23 mg/dL   Creatinine, Ser 1.35 (H) 0.61 - 1.24 mg/dL   Calcium 9.7 8.9 - 10.3 mg/dL   Total Protein 5.9 (L) 6.5 - 8.1 g/dL   Albumin 2.6 (L) 3.5 - 5.0 g/dL   AST 45 (H) 15 - 41 U/L   ALT 36 0 - 44 U/L   Alkaline Phosphatase 187 (H) 38 - 126 U/L   Total Bilirubin 2.7 (H) 0.3 - 1.2 mg/dL   GFR calc non Af Amer 50 (L) >60 mL/min   GFR calc Af Amer 58 (L) >60 mL/min   Anion gap 10 5 - 15  Glucose, capillary     Status: Abnormal   Collection Time: 01/04/20 11:37 AM  Result Value Ref Range   Glucose-Capillary 155 (H) 70 - 99 mg/dL   Comment 1 Notify RN   Glucose, capillary     Status: Abnormal   Collection Time: 01/04/20  4:35  PM  Result Value Ref Range   Glucose-Capillary 116 (H) 70 - 99 mg/dL   Comment 1 Notify RN   Glucose, capillary     Status: Abnormal   Collection Time: 01/04/20 10:01 PM  Result Value Ref Range   Glucose-Capillary 125 (H) 70 - 99 mg/dL  Basic metabolic panel     Status: Abnormal   Collection Time: 01/05/20  6:13 AM  Result Value Ref Range   Sodium 139 135 - 145 mmol/L   Potassium 3.6 3.5 - 5.1 mmol/L   Chloride 106 98 - 111 mmol/L   CO2 23 22 - 32 mmol/L   Glucose, Bld 139 (H) 70 - 99 mg/dL   BUN 37 (H) 8 - 23 mg/dL   Creatinine, Ser 1.16 0.61 - 1.24 mg/dL   Calcium 10.2 8.9 - 10.3 mg/dL   GFR calc non Af Amer >60 >60 mL/min   GFR calc Af Amer >60 >60 mL/min   Anion gap 10 5 - 15  CBC     Status: Abnormal   Collection Time: 01/05/20  6:13 AM  Result Value Ref Range   WBC 12.8 (H) 4.0 - 10.5 K/uL   RBC 3.01 (L) 4.22 - 5.81 MIL/uL   Hemoglobin 8.6 (L) 13.0 - 17.0 g/dL   HCT 26.4 (L) 39.0 - 52.0 %   MCV 87.7 80.0 - 100.0 fL   MCH 28.6 26.0 - 34.0 pg   MCHC 32.6 30.0 - 36.0 g/dL   RDW 14.6 11.5 - 15.5 %   Platelets 194 150 - 400 K/uL   nRBC 0.0 0.0 - 0.2 %  Magnesium     Status: None   Collection Time: 01/05/20  6:13 AM  Result Value Ref Range   Magnesium 2.1  1.7 - 2.4 mg/dL  Glucose, capillary     Status: Abnormal   Collection Time: 01/05/20  7:53 AM  Result Value Ref Range   Glucose-Capillary 141 (H) 70 - 99 mg/dL    No results found.  Assessment/Plan: 8 Days Post-Op   Active Problems:   Closed hip fracture requiring operative repair, right, sequela   Complication of Foley catheter (HCC)   Leukocytosis   1. Advance diet 2. Up with therapy 3. Spoke with hospitalist Dr. Posey Pronto and Dr. Mack Guise will discontinue Lovenox and restart home plavix with aspirin. 4. Continue Pain control. 5. WBAT on RLE 6. Discharge per medicine to SNF 7. Follow up in one week for staple removal and x-ray in Dr. Harden Mo office. Call for appointment [phone]   Carlynn Spry , PA-C 01/05/2020, 8:02 AM

## 2020-01-05 NOTE — Progress Notes (Signed)
[facility] Progress Note  Patient: Grant Mcdaniel    [id number]  DOA: 12/27/2019     Date of Service: the patient was seen and examined on 01/05/2020  Chief Complaint  Patient presents with  . Fall   Brief hospital course: 79 year old male with history of coronary artery disease, diabetes mellitus, hypertension, hypothyroidism, PVD, chronic kidney disease stage IIIa presented to the hospital after tripping on his driveway and fell on his right side.  2 weeks back he also had a fall bruising his right eye and forehead.  At that time head CT done was negative for any bleeding or injury.  X-ray done in the ED showed right hip fracture.  Patient admitted to hospital service and underwent right hip hemiarthroplasty on 1/8. On 1/8 patient had malposition of the Foley catheter and later on as a hospital course progressed had gross hematuria. On 1/9, when physical therapist was evaluating the patient she noticed patient to have poor coordination in his left leg.  MRI brain was done showing acute small right cerebellar stroke.  Currently further plan is arranged for outpatient discharge to SNF possibly tomorrow.  Assessment and Plan: Closed hip fracture requiring operative repair, right, sequela SP right hip hemiarthroplasty.  12/28/2019 Pain better controlled. Continue as needed Vicodin and Robaxin. Continue bowel regimen.   Orthopedics recommends DVT prophylaxis with Plavix and aspirin only.  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  Noted for poor left leg coordination during PT evaluation postop.  On 12/29/2019 MRI shows right cerebellar stroke.   Suspected to be due to small vessel disease. 2D echo with normal EF and no wall motion abnormality or thrombus. Carotid ultrasound without significant stenosis. CT angiogram head and neck done negative for vertebral artery stenosis. Patient chronically on Plavix for PVD.   Aspirin Plavix and Lovenox were on hold due to gross hematuria.  No further bleeding on resumption of this medicines so far.  Neurology recommended dual antiplatelet therapy with aspirin and Plavix, Continue statin (LDL of 57).  Also recommends 30-day Holter monitoring, to rule out possible A. fib contributing to an embolic stroke.  Gram-negative sepsis. Proteus vulgaris bacteremia Patient's temperature was elevated.  Leukocytosis was significantly worsening. Significant bandemia seen as well. Patient's blood cultures were performed.  Which are positive for Proteus. Urine cultures were performed. Chest x-ray was performed which did not show any pneumonia. Patient started on IV vancomycin and cefepime for broad-spectrum coverage Later on transition to IV cefepime only. Based on the sensitivities patient is currently on ciprofloxacin. Ideally will require 7 to 10 days treatment course.  From improvement which started on 01/04/2020.  Acute metabolic encephalopathy (0000000) resolved Unclear etiology.   Head CT negative for acute findings.   Chest x-ray negative for infiltrate.    Complication of Foley catheter [facility]) Patient has BPH on Flomax.   Patient had Foley placed in with balloon blown up into the prostate.  It was removed and urology placed a new catheter Noted for persistent hematuria Urology consulted again who evaluated the patient on 1/11 and feels that Foley may be irritating the prostate and causing ongoing bleeding.  Foley catheter was removed. Patient currently has a condom catheter and does not have any further hematuria. Outpatient follow-up with urology in 1 week recommended.  Acute postoperative blood loss anemia Hemoglobin dropped to 7.7 with persistent hematuria.   Received 1 unit PRBC.   Aspirin Plavix were on hold.  H&H currently stable.    Hypothyroidism Continue Synthroid  History of CAD and peripheral vascular disease Continue statin.  Plavix on hold  for hematuria.  Resume on discharge  Essential hypertension Stable.  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  Continue sliding scale coverage  Diet: Cardiac diet DVT Prophylaxis: Subcutaneous Lovenox   Advance goals of care discussion: DNR  Family Communication: no family was present at bedside, at the time of interview.   Disposition:  Pt is from home, admitted with hip fracture, had acute stroke as well as gross hematuria secondary to Foley malposition, still recovering from sepsis, which precludes a safe discharge. Discharge to as SNF, when likely tomorrow on 01/06/2020 if medically stable.  Subjective: Denies any acute complaint other than being fatigued and tired.  No nausea no vomiting.  Physical Exam: General:  alert oriented to time, place, and person.  Appear in mild distress, affect appropriate Eyes: PERRL ENT: Oral Mucosa Clear, moist  Neck: no JVD,  Cardiovascular: S1 and S2 Present, no Murmur,  Respiratory: good respiratory effort, Bilateral Air entry equal and Decreased, no Crackles, no wheezes Abdomen: Bowel Sound present, Soft and no tenderness,  Skin: no rash Extremities: no Pedal edema, no calf tenderness Neurologic: without any new focal findings  Gait not checked due to patient safety concerns  Vitals:   01/04/20 1554 01/05/20 0052 01/05/20 0756 01/05/20 1646  BP: (!) 168/81 (!) 155/64 (!) 163/54 (!) 168/75  Pulse: 69 73 85 76  Resp: 17 20 18 18   Temp: 98 F (36.7 C) 97.6 F (36.4 C) 98.2 F (36.8 C) 97.7 F (36.5 C)  TempSrc: Oral Oral Oral Oral  SpO2: 99% 95% 94% 98%  Weight:      Height:        Intake/Output Summary (Last 24 hours) at 01/05/2020 1952 Last data filed at 01/05/2020 1900 Gross per 24 hour  Intake 1531.49 ml  Output 1600 ml  Net -68.51 ml   Filed Weights   12/27/19 1706  Weight: 106.1 kg    Data Reviewed: I have personally reviewed and interpreted daily labs, tele  strips, imagings as discussed above. I reviewed all nursing notes, pharmacy notes, vitals, pertinent old records I have discussed plan of care as described above with RN and patient/family.  CBC: Recent Labs  Lab 01/02/20 0459 01/02/20 0906 01/03/20 0418 01/04/20 0856 01/05/20 0613  WBC 39.5* 37.5* 26.6* 20.6* 12.8*  NEUTROABS  --  33.0* 22.3* 18.4*  --   HGB 8.2* 8.3* 7.8* 8.3* 8.6*  HCT 24.4* 24.9* 23.2* 24.9* 26.4*  MCV 87.1 87.4 86.2 87.1 87.7  PLT 177 179 155 179 [id number]   Basic Metabolic Panel: Recent Labs  Lab 01/01/20 1015 01/02/20 0906 01/03/20 0418 01/04/20 0856 01/05/20 0613  NA 133* 134* 134* 137 139  K 3.8 3.8 3.5 3.5 3.6  CL 99 98 100 105 106  CO2 20* 24 23 22 23   GLUCOSE 172* 118* 132* 120* 139*  BUN 36* 51* 61* 48* 37*  CREATININE 1.61* 1.80* 1.72* 1.35* 1.16  CALCIUM 9.4 9.3 9.2 9.7 10.2  MG  --   --  1.8  --  2.1    Studies: No results found.  Scheduled Meds: .  stroke: mapping our early stages of recovery book   Does not apply Once  . aspirin EC  81 mg Oral Daily  . atorvastatin  40 mg Oral q1800  . Chlorhexidine Gluconate Cloth  6 each  Topical Daily  . ciprofloxacin  500 mg Oral BID  . docusate sodium  100 mg Oral BID  . enoxaparin (LOVENOX) injection  40 mg Subcutaneous Q24H  . fluticasone  1 spray Each Nare Daily  . insulin aspart  0-5 Units Subcutaneous QHS  . insulin aspart  0-9 Units Subcutaneous TID WC  . levothyroxine  75 mcg Oral Q0600  . loratadine  10 mg Oral Daily  . saccharomyces boulardii  250 mg Oral BID  . senna  1 tablet Oral BID  . tamsulosin  0.4 mg Oral QPC breakfast   Continuous Infusions: . sodium chloride 250 mL (01/04/20 1052)  . lactated ringers 100 mL/hr at 01/03/20 0624  . methocarbamol (ROBAXIN) IV     PRN Meds: sodium chloride, acetaminophen, alum & mag hydroxide-simeth, bisacodyl, HYDROcodone-acetaminophen, HYDROcodone-acetaminophen, magnesium citrate, methocarbamol **OR** methocarbamol (ROBAXIN) IV, morphine  injection, ondansetron **OR** ondansetron (ZOFRAN) IV, polyethylene glycol  Time spent: 35 minutes  Author: Berle Mull, MD [facility] 01/05/2020 7:52 PM  To reach On-call, see care teams to locate the attending and reach out to them via [email or web address]. If 7PM-7AM, please contact night-coverage If you still have difficulty reaching the attending provider, please page the [facility] (Director on Call) for [facility] on amion for assistance.

## 2020-01-05 NOTE — Progress Notes (Signed)
Pharmacy Antibiotic Note  Grant Mcdaniel is a 79 y.o. male admitted on 12/27/2019. Patient being followed by urology. SCr continues to rise, but with improvement in hematuria. Pharmacy has been consulted Cipofloxacin dosing.  Plan: Patient being transitioned to ciprofloxacin 500mg  PO BID.   Height: 6\' 2"  (188 cm) Weight: 234 lb (106.1 kg) IBW/kg (Calculated) : 82.2  Temp (24hrs), Avg:97.9 F (36.6 C), Min:97.6 F (36.4 C), Max:98.2 F (36.8 C)  Recent Labs  Lab 01/01/20 0800 01/01/20 1015 01/02/20 0459 01/02/20 0906 01/03/20 0418 01/04/20 0856 01/05/20 0613  WBC   < >  --  39.5* 37.5* 26.6* 20.6* 12.8*  CREATININE  --  1.61*  --  1.80* 1.72* 1.35* 1.16   < > = values in this interval not displayed.    Estimated Creatinine Clearance: 69.2 mL/min (by C-G formula based on SCr of 1.16 mg/dL).    Allergies  Allergen Reactions  . Sulfa Antibiotics Nausea And Vomiting    Antimicrobials this admission: Vancomycin 1/13 >> 1/14 Cefepime 1/13 >> 1/15 Ciprofloxacin 1/16 >>  Dose adjustments this admission: NA  Microbiology results: 1/13 BCx: Proteus Vulgaris  1/13 UCx: insignificant growth 1/7 COVID19: negative   Thank you for allowing pharmacy to be a part of this patient's care.  Daley Gosse L, RPh 01/05/2020 10:40 AM

## 2020-01-05 NOTE — Progress Notes (Signed)
Physical Therapy Treatment Patient Details Name: Grant Mcdaniel MRN: [id number] DOB: 19-Nov-1941 Today's Date: 01/05/2020    History of Present Illness Patient is a 79 year old male who was admitted to [facility] for a R hip hemiarthroplasty secondary to a right femoral neck fracture. Pt. had a cerbellar infarct. Pt. PMHx includes: CAD, CKD, DM with CKD stage III, Hypercholesterolemia, HTN, hypothyroid, PVD, and falls.    PT Comments    Ready to try gait this pm.  Taken to hallway for ease and safety in recliner.  Attempted to stand +1 but feet sliding on floor and unable to stand despite grip socks.  +2 to stand with feet braced to prevent sliding.  Initial steps with heavy forward lean and +2 called (along with +1  recliner follow) and he was able to progress gait with close min a x 2 to min guard +2.  Total gait distance 37' with RW then transitioned back to bed with mod a x 1.  Overall tolerated gait well this pm despite fatigue.  He continues to require +2-+3 for safety with gait. SNF remains appropriate for discharge.   Follow Up Recommendations  SNF     Equipment Recommendations  Rolling walker with 5" wheels;3in1 (PT)    Recommendations for Other Services       Precautions / Restrictions Precautions Precautions: Posterior Hip;Fall Precaution Comments: WBAT Restrictions Weight Bearing Restrictions: Yes RLE Weight Bearing: Weight bearing as tolerated Other Position/Activity Restrictions: New CVA following Hip surgery    Mobility  Bed Mobility Overal bed mobility: Needs Assistance Bed Mobility: Sit to Supine     Supine to sit: Mod assist Sit to supine: Mod assist   General bed mobility comments: improved today with better motor planning and sequencing  Transfers Overall transfer level: Needs assistance Equipment used: Rolling walker (2 wheeled) Transfers: Sit to/from Stand Sit to Stand: Min assist;Mod assist;+2 physical assistance         General transfer comment:  feet sliding on floor and needed bracing by staff to stand despite grippie socks  Ambulation/Gait Ambulation/Gait assistance: Min guard;+2 physical assistance Gait Distance (Feet): 60 Feet Assistive device: Rolling walker (2 wheeled) Gait Pattern/deviations: Step-to pattern;Wide base of support Gait velocity: decreased   General Gait Details: attemtped gait wth +1 and recliner follow.  Needed +2 and recliner follow initially for safety due to forward lean with gait   Stairs             Wheelchair Mobility    Modified Rankin (Stroke Patients Only)       Balance Overall balance assessment: Needs assistance Sitting-balance support: Feet unsupported;No upper extremity supported Sitting balance-Leahy Scale: Good Sitting balance - Comments: upright posture   Standing balance support: Bilateral upper extremity supported Standing balance-Leahy Scale: Fair Standing balance comment: forward lean on walker with gait makes stepping precarious initially                            Cognition Arousal/Alertness: Awake/alert Behavior During Therapy: WFL for tasks assessed/performed Overall Cognitive Status: Within Functional Limits for tasks assessed                                        Exercises Other Exercises Other Exercises: Supine AAROM ankle pumps,heel slides, ab/add and SLR x 10, sitting LAQ ankle pumps and marches x 10  both BLE  General Comments        Pertinent Vitals/Pain Pain Assessment: Faces Faces Pain Scale: Hurts a little bit Pain Location: R hip pain Pain Descriptors / Indicators: Tightness Pain Intervention(s): Limited activity within patient's tolerance;Monitored during session;Repositioned    Home Living                      Prior Function            PT Goals (current goals can now be found in the care plan section) Progress towards PT goals: Progressing toward goals    Frequency    BID      PT  Plan Current plan remains appropriate    Co-evaluation              AM-PAC PT "6 Clicks" Mobility   Outcome Measure  Help needed turning from your back to your side while in a flat bed without using bedrails?: A Little Help needed moving from lying on your back to sitting on the side of a flat bed without using bedrails?: A Little Help needed moving to and from a bed to a chair (including a wheelchair)?: A Lot Help needed standing up from a chair using your arms (e.g., wheelchair or bedside chair)?: A Lot Help needed to walk in hospital room?: A Lot Help needed climbing 3-5 steps with a railing? : A Lot 6 Click Score: 14    End of Session Equipment Utilized During Treatment: Gait belt Activity Tolerance: Patient tolerated treatment well Patient left: in bed;with call bell/phone within reach;with bed alarm set Nurse Communication: Mobility status       Time: 1300-1320 PT Time Calculation (min) (ACUTE ONLY): 20 min  Charges:  $Gait Training: 8-22 mins $Therapeutic Exercise: 8-22 mins $Therapeutic Activity: 8-22 mins                    Chesley Noon, PTA 01/05/20, 1:28 PM

## 2020-01-05 NOTE — Progress Notes (Signed)
Occupational Therapy Treatment Patient Details Name: Grant Mcdaniel MRN: [id number] DOB: March 26, 1941 Today's Date: 01/05/2020    History of present illness Patient is a 79 year old male who was admitted to [facility] for a R hip hemiarthroplasty secondary to a right femoral neck fracture. Pt. had a cerbellar infarct. Pt. PMHx includes: CAD, CKD, DM with CKD stage III, Hypercholesterolemia, HTN, hypothyroid, PVD, and falls.   OT comments  Pt seen for OT tx this date to f/u re: safety with ADLs with special attention to hip precautions. Pt requires MIN/MOD A for bed mobility, demos G static sitting balance at EOB. Pt requires extended time to participate in LB dressing. Pt requires MOD A with AE. Will continue to progress with AE for independence. SNF still remains most appropriate d/c dispo.    Follow Up Recommendations  SNF    Equipment Recommendations  3 in 1 bedside commode    Recommendations for Other Services      Precautions / Restrictions Precautions Precautions: Posterior Hip;Fall Precaution Booklet Issued: Yes (comment) Precaution Comments: WBAT Restrictions Weight Bearing Restrictions: Yes RLE Weight Bearing: Weight bearing as tolerated Other Position/Activity Restrictions: New CVA following Hip surgery       Mobility Bed Mobility Overal bed mobility: Needs Assistance Bed Mobility: Supine to Sit;Sit to Supine     Supine to sit: Min assist Sit to supine: Mod assist      Transfers Overall transfer level: Needs assistance Equipment used: Rolling walker (2 wheeled) Transfers: Sit to/from Stand Sit to Stand: Min assist;Mod assist;+2 physical assistance         General transfer comment: feet sliding on floor and needed bracing by staff to stand despite grippie socks    Balance Overall balance assessment: Needs assistance Sitting-balance support: Feet unsupported;No upper extremity supported Sitting balance-Leahy Scale: Good     Standing balance support:  Bilateral upper extremity supported Standing balance-Leahy Scale: Fair Standing balance comment: forward lean on walker with gait makes stepping precarious initially                           ADL either performed or assessed with clinical judgement   ADL Overall ADL's : Needs assistance/impaired     Grooming: Wash/dry face;Oral care;Set up;Sitting               Lower Body Dressing: Moderate assistance;With adaptive equipment;Sitting/lateral leans                       Vision Baseline Vision/History: Wears glasses Wears Glasses: At all times Patient Visual Report: No change from baseline     Perception     Praxis      Cognition Arousal/Alertness: Awake/alert Behavior During Therapy: WFL for tasks assessed/performed Overall Cognitive Status: Within Functional Limits for tasks assessed                                          Exercises Other Exercises Other Exercises: OT facilitates pt education re: use of AE for LB ADLs. Pt demos good understandingg.   Shoulder Instructions       General Comments      Pertinent Vitals/ Pain       Pain Assessment: Faces Faces Pain Scale: Hurts a little bit Pain Location: R hip pain Pain Descriptors / Indicators: Tightness Pain Intervention(s): Limited activity within patient's tolerance;Monitored  during session  Home Living                                          Prior Functioning/Environment              Frequency  Min 2X/week        Progress Toward Goals  OT Goals(current goals can now be found in the care plan section)  Progress towards OT goals: Progressing toward goals  Acute Rehab OT Goals Patient Stated Goal: To regain independence  Plan Discharge plan remains appropriate    Co-evaluation                 AM-PAC OT "6 Clicks" Daily Activity     Outcome Measure   Help from another person eating meals?: None Help from another person  taking care of personal grooming?: A Little Help from another person toileting, which includes using toliet, bedpan, or urinal?: A Lot Help from another person bathing (including washing, rinsing, drying)?: A Lot Help from another person to put on and taking off regular upper body clothing?: A Little Help from another person to put on and taking off regular lower body clothing?: A Lot 6 Click Score: 16    End of Session    OT Visit Diagnosis: History of falling (Z91.81);Other abnormalities of gait and mobility (R26.89)   Activity Tolerance Patient tolerated treatment well   Patient Left with call bell/phone within reach;in bed;with bed alarm set   Nurse Communication          Time: [id number] OT Time Calculation (min): 39 min  Charges: OT General Charges $OT Visit: 1 Visit OT Treatments $Self Care/Home Management : 23-37 mins $Therapeutic Activity: 8-22 mins  Gerrianne Scale, [place], OTR/L ascom [phone] 01/05/20, 4:43 PM

## 2020-01-06 DIAGNOSIS — T839XXA Unspecified complication of genitourinary prosthetic device, implant and graft, initial encounter: Secondary | ICD-10-CM

## 2020-01-06 LAB — CBC
HCT: 27.1 % — ABNORMAL LOW (ref 39.0–52.0)
Hemoglobin: 8.6 g/dL — ABNORMAL LOW (ref 13.0–17.0)
MCH: 28.3 pg (ref 26.0–34.0)
MCHC: 31.7 g/dL (ref 30.0–36.0)
MCV: 89.1 fL (ref 80.0–100.0)
Platelets: 205 10*3/uL (ref 150–400)
RBC: 3.04 MIL/uL — ABNORMAL LOW (ref 4.22–5.81)
RDW: 14.7 % (ref 11.5–15.5)
WBC: 11.2 10*3/uL — ABNORMAL HIGH (ref 4.0–10.5)
nRBC: 0 % (ref 0.0–0.2)

## 2020-01-06 LAB — GLUCOSE, CAPILLARY
Glucose-Capillary: 140 mg/dL — ABNORMAL HIGH (ref 70–99)
Glucose-Capillary: 173 mg/dL — ABNORMAL HIGH (ref 70–99)
Glucose-Capillary: 121 mg/dL — ABNORMAL HIGH (ref 70–99)

## 2020-01-06 LAB — BASIC METABOLIC PANEL
Anion gap: 8 (ref 5–15)
BUN: 30 mg/dL — ABNORMAL HIGH (ref 8–23)
CO2: 25 mmol/L (ref 22–32)
Calcium: 10.3 mg/dL (ref 8.9–10.3)
Chloride: 106 mmol/L (ref 98–111)
Creatinine, Ser: 1.21 mg/dL (ref 0.61–1.24)
GFR calc Af Amer: >60 mL/min (ref >60)
GFR calc non Af Amer: 57 mL/min — ABNORMAL LOW (ref >60)
Glucose, Bld: 142 mg/dL — ABNORMAL HIGH (ref 70–99)
Potassium: 4 mmol/L (ref 3.5–5.1)
Sodium: 139 mmol/L (ref 135–145)

## 2020-01-06 LAB — SARS CORONAVIRUS 2 (TAT 6-24 HRS): SARS Coronavirus 2: NEGATIVE

## 2020-01-06 MED ORDER — AMLODIPINE BESYLATE 10 MG PO TABS
10.0000 mg | ORAL_TABLET | Freq: Every day | ORAL | Status: DC
  Administered 2020-01-06: 10 mg via ORAL
  Filled 2020-01-06: qty 1

## 2020-01-06 MED ORDER — BENAZEPRIL HCL 20 MG PO TABS
40.0000 mg | ORAL_TABLET | Freq: Every day | ORAL | Status: DC
  Administered 2020-01-06: 40 mg via ORAL
  Filled 2020-01-06: qty 2

## 2020-01-06 MED ORDER — CIPROFLOXACIN HCL 500 MG PO TABS: 500.0000 mg | ORAL_TABLET | Freq: Two times a day (BID) | ORAL | 0 refills | Status: AC

## 2020-01-06 MED ORDER — STROKE: EARLY STAGES OF RECOVERY BOOK: 1.0000 | Freq: Once | Status: AC

## 2020-01-06 NOTE — Progress Notes (Signed)
Patient is being discharged to [facility] this afternoon. IV removed. Made several attempts to contact facility but it rang 4 separate calls, this nurse was hung up on and last attempt I was on hold for 10 minutes. Printed AVS for [facility]. NT prepared patient for transport. EMS called.

## 2020-01-06 NOTE — Progress Notes (Signed)
EMS here to transport patient

## 2020-01-06 NOTE — TOC Progression Note (Signed)
Transition of Care [facility]) - Progression Note    Patient Details  Name: BIRDIE GROTHAUS MRN: [id number] Date of Birth: [birth year]  Transition of Care [facility] CM/SW Contact  [place], [place], [place] Phone Number: 01/06/2020, 10:58 AM  Clinical Narrative:    Patient to discharge today by EMS to [facility]. Patient to be transported by EMS. Patient to go into Room 6. Report to be called in to [phone].   Chrystal Land, LCSW Clinical Social Work [phone]   Expected Discharge Plan: Skilled Nursing Facility Barriers to Discharge: Ship broker, Continued Medical Work up  Expected Discharge Plan and Services Expected Discharge Plan: [place] In-house Referral: Clinical Social Work Discharge Planning Services: NA   Living arrangements for the past 2 months: Single Family Home                                       Social Determinants of Health (SDOH) Interventions    Readmission Risk Interventions No flowsheet data found.

## 2020-01-06 NOTE — Discharge Summary (Signed)
Physician Discharge Summary   Grant Mcdaniel  male DOB: 1941-06-03  [id number]  PCP: [facility] [place] date: 12/27/2019 Discharge date: 01/06/2020  Admitted From: home Disposition:  SNF CODE STATUS: DNR  Discharge Instructions    Diet - low sodium heart healthy   Complete by: As directed    Discharge instructions   Complete by: As directed    Follow up in one week for staple removal and x-ray in Dr. Harden Mo office. Call for appointment [phone].  Please present to cardiology clinic for placement of 30-day Holter Monitor.   Increase activity slowly   Complete by: As directed        Hospital Course:  For full details, please see H&P, progress notes, consult notes and ancillary notes.  Briefly,  Grant Mcdaniel is a 79 year old Caucasian male with history of coronary artery disease, diabetes mellitus, hypertension, hypothyroidism, PVD, chronic kidney disease stage IIIa who presented to the hospital after tripping on his driveway and fell on his right side. 2 weeks back he also had a fall bruising his right eye and forehead. At that time head CT done was negative for any bleeding or injury. X-ray done in the ED showed right hip fracture.   Patient was admitted to hospital service and underwent right hip hemiarthroplasty on 1/8.  On 1/8 patient had malposition of the Foley catheter and later on as a hospital course progressed had gross hematuria.  On 1/9, when physical therapist was evaluating the patient, she noticed patient to have poor coordination in his left leg. MRI brain was done showing acute small right cerebellar stroke.  Closed hip fracture requiring operative repair,  S/P right hip hemiarthroplasty on 12/28/2019 Prior to discharge, pt reported no pain.  Orthopedics recommended DVT prophylaxis with Plavix and aspirin only.  Pt will follow up in one week for staple removal and x-ray in Dr. Harden Mo office.  Acute right cerebellar  stroke ([name]) Noted for poor left leg coordination during PT evaluation postop.  On 12/29/2019 MRI showed right cerebellar stroke. Suspected to be due to small vessel disease.  2D echo with normal EF and no wall motion abnormality or thrombus. Carotid ultrasound without significant stenosis.  CT angiogram head and neck done negative for vertebral artery stenosis.  Patient chronically on Plavix for PVD which was held while inpatient due to gross hematuria, which has since resolved.  Pt is discharged on ASA and Plavix for stroke prevention, per neuro rec.  Pt's LDL was 57.  Also recommend 30-day Holter monitoring, to rule out possible A. fib contributing to an embolic stroke.  [place] cardiologist, Dr. Thereasa Solo clinic will arrange for placement of this Holter Monitor after discharge.  Sepsis 2/2 Proteus vulgaris bacteremia Pt had fever, Leukocytosis and significant bandemia.  Patient's blood cultures were performed, which were positive for Proteus.  Urine cx insignificant growth.  Chest x-ray was performed which did not show any pneumonia.  Patient started on IV vancomycin and cefepime for broad-spectrum coverage and later on transitioned to IV cefepime only.  Pt received 3 days of cefepime and then de-escalated to cipro based on culture sensitivities.  Pt will finish a 14-day course with Cipro until 01/14/20.  Acute metabolic encephalopathy (0000000) resolved Unclear etiology. Head CT negative for acute findings. Chest x-ray negative for infiltrate.   Complication of Foley catheter [facility]), resolved Patient has BPH on Flomax. Patient had Foley placed in with balloon blown up into the prostate.  It was removed and  urology placed a new catheter, however noted for persistent hematuria, so Urology consulted again who evaluated the patient on 1/11 and felt that Foley may be irritating the prostate and causing ongoing bleeding, so Foley catheter was removed.  Hematuria has since resolved, and pt has had no  issue with retention.    Acute postoperative blood loss anemia Hemoglobin dropped to 7.7 with persistent hematuria (Hgb 11.9 on presentation). Received 1 unit PRBC.  Hgb has been stable around 8's.  Hypothyroidism Continued Synthroid  History of CAD and peripheral vascular disease Continue statin. Plavix on hold for hematuria and resumed on discharge.  Essential hypertension Stable. Continue home meds.  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 Received sliding scale coverage while inpatient.  Pt discharged on home Tradjenta.   Discharge Diagnoses:  Active Problems:   Closed hip fracture requiring operative repair, right, sequela   Complication of Foley catheter ([place])   Leukocytosis    Discharge Instructions:  Allergies as of 01/06/2020      Reactions   Sulfa Antibiotics Nausea And Vomiting      Medication List    STOP taking these medications   cephALEXin 500 MG capsule Commonly known as: KEFLEX     TAKE these medications    stroke: mapping our early stages of recovery book Misc 1 each by Does not apply route once for 1 dose.   amLODipine-benazepril 10-40 MG capsule Commonly known as: LOTREL Take 1 capsule by mouth daily.   aspirin EC 81 MG tablet Take 81 mg by mouth at bedtime.   atorvastatin 40 MG tablet Commonly known as: LIPITOR Take 40 mg by mouth at bedtime.   bisoprolol-hydrochlorothiazide 10-6.25 MG tablet Commonly known as: ZIAC Take 1 tablet by mouth daily.   Cholecalciferol 25 MCG (1000 UT) capsule Take 1,000 Units by mouth daily.   ciprofloxacin 500 MG tablet Commonly known as: CIPRO Take 1 tablet (500 mg total) by mouth 2 (two) times daily for 8 days.   clopidogrel 75 MG tablet Commonly known as: PLAVIX Take 75 mg by mouth daily.   Coenzyme Q10 10 MG capsule Take 10 mg by mouth daily.   Fish Oil Triple Strength 1400 MG Caps Take 1 capsule by mouth daily.   fluticasone 50 MCG/ACT nasal  spray Commonly known as: FLONASE Place 1 spray into both nostrils daily.   levothyroxine 75 MCG tablet Commonly known as: SYNTHROID Take 75 mcg by mouth daily before breakfast.   loratadine 10 MG tablet Commonly known as: CLARITIN Take 10 mg by mouth daily.   multivitamin with minerals Tabs tablet Take 1 tablet by mouth daily.   tamsulosin 0.4 MG Caps capsule Commonly known as: FLOMAX Take 0.4 mg by mouth daily.   Tradjenta 5 MG Tabs tablet Generic drug: linagliptin Take 5 mg by mouth daily.        Contact information for follow-up providers    Thornton Park, MD. Schedule an appointment as soon as possible for a visit in 1 week(s).   Specialty: Orthopedic Surgery Why: staple removal and x-ray  Contact information: [place] [address] [phone]        Kate Sable, MD. Go to.   Specialties: Cardiology, Radiology Why: For Holter Monitor placement Contact information: [place] [address] [id number]            Contact information for after-discharge care    [facility] .  Service: Skilled Nursing Contact information: [place] [phone]                  Allergies  Allergen Reactions  . Sulfa Antibiotics Nausea And Vomiting     The results of significant diagnostics from this hospitalization (including imaging, microbiology, ancillary and laboratory) are listed below for reference.   Consultations:   Procedures/Studies: CT ANGIO HEAD W OR WO CONTRAST  Result Date: 12/31/2019 CLINICAL DATA:  Stroke, follow-up EXAM: CT ANGIOGRAPHY HEAD AND NECK TECHNIQUE: Multidetector CT imaging of the head and neck was performed using the standard protocol during bolus administration of intravenous contrast. Multiplanar CT image reconstructions and MIPs were obtained to evaluate the vascular anatomy. Carotid stenosis  measurements (when applicable) are obtained utilizing NASCET criteria, using the distal internal carotid diameter as the denominator. CONTRAST:  29mL OMNIPAQUE IOHEXOL 350 MG/ML SOLN COMPARISON:  12/17/2019 head CT, 12/29/2019 MRI FINDINGS: CT HEAD FINDINGS Brain: There is no acute intracranial hemorrhage. Small chronic cerebellar infarcts identified. More recent small right cerebellar infarct is better seen on MRI. Gray-white differentiation is preserved. Ventricles are stable in size. No extra-axial collection. Vascular: There is mild intracranial atherosclerotic calcification at the skull base. Skull: Unremarkable. Sinuses: Mild mucosal thickening. Orbits: Orbits are unremarkable. Review of the MIP images confirms the above findings CTA NECK FINDINGS Aortic arch: Mild calcified plaque. Great vessel origins are patent. Right carotid system: Patent. Mild calcified plaque at the ICA origin without measurable stenosis. Left carotid system: Patent. No measurable stenosis at the ICA origin. Vertebral arteries: Patent and codominant. Calcified plaque is present at the left vertebral artery origin with mild stenosis. Skeleton: Multilevel degenerative changes of the cervical spine. There are bulky anterior bridging osteophytes with indentation of the posterior pharynx and esophagus. Other neck: No neck mass or adenopathy. Upper chest: No apical lung mass. Review of the MIP images confirms the above findings CTA HEAD FINDINGS Anterior circulation: Intracranial internal carotid arteries patent with calcified plaque causing mild stenosis. Anterior and middle cerebral arteries are patent. Posterior circulation: Intracranial vertebral arteries are patent. Posterior inferior cerebellar arteries are patent. Basilar artery is patent. Superior cerebellar arteries are patent. Posterior cerebral arteries are patent. Venous sinuses: As permitted by contrast timing, patent. Review of the MIP images confirms the above findings  IMPRESSION: No acute intracranial hemorrhage or new loss of gray-white differentiation. Recent small cerebellar infarct is better seen on MRI. No hemodynamically significant stenosis or evidence of dissection. Electronically Signed   By: Macy Mis M.D.   On: 12/31/2019 13:31   DG Elbow Complete Right  Result Date: 12/27/2019 CLINICAL DATA:  Recent fall with right elbow pain, initial encounter EXAM: RIGHT ELBOW - COMPLETE 3+ VIEW COMPARISON:  None. FINDINGS: Considerable degenerative changes of the elbow joint are noted particularly in the articulation of the humerus and ulna. Soft tissue swelling is noted posteriorly consistent with the recent injury. No joint effusion is seen. Large olecranon spurs are seen as well as some findings suggestive of olecranon bursitis. IMPRESSION: No acute fracture is noted. Soft tissue changes are seen consistent with the given clinical history. Electronically Signed   By: Inez Catalina M.D.   On: 12/27/2019 16:16   CT HEAD WO CONTRAST  Result Date: 01/01/2020 CLINICAL DATA:  Cerebellar infarct, coronary artery disease, chronic kidney disease, type II diabetes mellitus, hypertension, increased confusion EXAM: CT HEAD WITHOUT CONTRAST TECHNIQUE: Contiguous axial images were obtained from the base of the skull through the vertex without  intravenous contrast. Sagittal and coronal MPR images reconstructed from axial data set. COMPARISON:  12/31/2019 FINDINGS: Brain: Scattered motion artifacts significantly limit exam despite repeating images, patient unable to follow commands. Generalized atrophy. Stable ventricular morphology. No midline shift or mass effect. No gross hemorrhage, mass or infarct identified within limitations of motion. Vascular: Mild atherosclerotic calcifications of internal carotid arteries at skull base Skull: No gross abnormality seen Sinuses/Orbits: Small mucosal retention cyst and mild mucosal thickening in sphenoid sinus Other: N/A IMPRESSION: Exam  severely limited by patient motion artifacts. Generalized atrophy without definite acute intracranial abnormalities as above. Electronically Signed   By: Lavonia Dana M.D.   On: 01/01/2020 10:08   CT Head Wo Contrast  Result Date: 12/17/2019 CLINICAL DATA:  Fall. EXAM: CT HEAD WITHOUT CONTRAST TECHNIQUE: Contiguous axial images were obtained from the base of the skull through the vertex without intravenous contrast. COMPARISON:  None. FINDINGS: Brain: No evidence of acute infarction, hemorrhage, hydrocephalus, extra-axial collection or mass lesion/mass effect. Small remote bilateral cerebellar infarcts. Mild brain atrophy. Vascular: Atherosclerotic calcification Skull: Right forehead laceration.  No calvarial fracture. Sinuses/Orbits: No evidence of injury IMPRESSION: 1. No evidence of intracranial injury. 2. Right forehead laceration without fracture. Electronically Signed   By: Monte Fantasia M.D.   On: 12/17/2019 04:30   CT ANGIO NECK W OR WO CONTRAST  Result Date: 12/31/2019 CLINICAL DATA:  Stroke, follow-up EXAM: CT ANGIOGRAPHY HEAD AND NECK TECHNIQUE: Multidetector CT imaging of the head and neck was performed using the standard protocol during bolus administration of intravenous contrast. Multiplanar CT image reconstructions and MIPs were obtained to evaluate the vascular anatomy. Carotid stenosis measurements (when applicable) are obtained utilizing NASCET criteria, using the distal internal carotid diameter as the denominator. CONTRAST:  46mL OMNIPAQUE IOHEXOL 350 MG/ML SOLN COMPARISON:  12/17/2019 head CT, 12/29/2019 MRI FINDINGS: CT HEAD FINDINGS Brain: There is no acute intracranial hemorrhage. Small chronic cerebellar infarcts identified. More recent small right cerebellar infarct is better seen on MRI. Gray-white differentiation is preserved. Ventricles are stable in size. No extra-axial collection. Vascular: There is mild intracranial atherosclerotic calcification at the skull base. Skull:  Unremarkable. Sinuses: Mild mucosal thickening. Orbits: Orbits are unremarkable. Review of the MIP images confirms the above findings CTA NECK FINDINGS Aortic arch: Mild calcified plaque. Great vessel origins are patent. Right carotid system: Patent. Mild calcified plaque at the ICA origin without measurable stenosis. Left carotid system: Patent. No measurable stenosis at the ICA origin. Vertebral arteries: Patent and codominant. Calcified plaque is present at the left vertebral artery origin with mild stenosis. Skeleton: Multilevel degenerative changes of the cervical spine. There are bulky anterior bridging osteophytes with indentation of the posterior pharynx and esophagus. Other neck: No neck mass or adenopathy. Upper chest: No apical lung mass. Review of the MIP images confirms the above findings CTA HEAD FINDINGS Anterior circulation: Intracranial internal carotid arteries patent with calcified plaque causing mild stenosis. Anterior and middle cerebral arteries are patent. Posterior circulation: Intracranial vertebral arteries are patent. Posterior inferior cerebellar arteries are patent. Basilar artery is patent. Superior cerebellar arteries are patent. Posterior cerebral arteries are patent. Venous sinuses: As permitted by contrast timing, patent. Review of the MIP images confirms the above findings IMPRESSION: No acute intracranial hemorrhage or new loss of gray-white differentiation. Recent small cerebellar infarct is better seen on MRI. No hemodynamically significant stenosis or evidence of dissection. Electronically Signed   By: Macy Mis M.D.   On: 12/31/2019 13:31   CT Cervical Spine Wo Contrast  Addendum Date: 12/17/2019   ADDENDUM REPORT: 12/17/2019 09:36 ADDENDUM: Correction to impression #2, the second sentence should read in the upper CERVICAL levels there is also posterior longitudinal ligament ossification. Electronically Signed   By: Monte Fantasia M.D.   On: 12/17/2019 09:36    Result Date: 12/17/2019 CLINICAL DATA:  Fall with laceration. EXAM: CT CERVICAL SPINE WITHOUT CONTRAST TECHNIQUE: Multidetector CT imaging of the cervical spine was performed without intravenous contrast. Multiplanar CT image reconstructions were also generated. COMPARISON:  None. FINDINGS: Alignment: Normal Skull base and vertebrae: Negative for fracture. 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 There is ankylosis from the occiput to T2. 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 bone island in the C2 body. Soft tissues and spinal canal: No prevertebral fluid or swelling. No visible canal hematoma. Lipoma in the posterior left para median neck the even more simple internal architecture than adjacent fat, up to 3 cm on axial slices. Disc levels:  Spondylitic changes as noted above. Upper chest: Negative IMPRESSION: 1. No acute finding. 2. 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 In the upper thoracic levels there is also posterior longitudinal ligament ossification. Electronically Signed: By: Monte Fantasia M.D. On: 12/17/2019 09:04   MR BRAIN WO CONTRAST  Result Date: 12/29/2019 CLINICAL DATA:  Acute neuro deficit, stroke suspected. Left leg symptoms. Recent fall sustaining a right femoral neck fracture treated with arthroplasty. EXAM: MRI HEAD WITHOUT CONTRAST TECHNIQUE: Multiplanar, multiecho pulse sequences of the brain and surrounding structures were obtained without intravenous contrast. COMPARISON:  Head CT 12/17/2019 FINDINGS: Brain: There is a 6 mm acute right cerebellar infarct. Small chronic infarcts are present in the cerebellum bilaterally. No intracranial hemorrhage, mass, midline shift, or extra-axial fluid collection is identified. There is moderate cerebral atrophy. A few small foci of T2 hyperintensity in the cerebral white matter are nonspecific and not considered  abnormal for age. Vascular: Major intracranial vascular flow voids are preserved. Skull and upper cervical spine: Unremarkable bone marrow signal. Prominent ligamentous thickening posterior to the dens without significant mass effect on the cervicomedullary junction. Sinuses/Orbits: Unremarkable orbits. Small right maxillary sinus mucous retention cyst. Mild scattered mucosal thickening in the paranasal sinuses, greatest in the left sphenoid sinus. Clear mastoid air cells. Other: None. IMPRESSION: 1. Small acute right cerebellar infarct. 2. Chronic bilateral cerebellar infarcts. Electronically Signed   By: Logan Bores M.D.   On: 12/29/2019 11:48   CT ABDOMEN PELVIS W CONTRAST  Result Date: 12/28/2019 CLINICAL DATA:  Abdominal trauma with gross hematuria. EXAM: CT ABDOMEN AND PELVIS WITH CONTRAST TECHNIQUE: Multidetector CT imaging of the abdomen and pelvis was performed using the standard protocol following bolus administration of intravenous contrast. CONTRAST:  55mL OMNIPAQUE IOHEXOL 300 MG/ML  SOLN COMPARISON:  None. FINDINGS: LOWER CHEST: No basilar pleural or apical pericardial effusion. HEPATOBILIARY: Normal hepatic contours. No intra- or extrahepatic biliary dilatation. Status post cholecystectomy. PANCREAS: Normal pancreas. No ductal dilatation or peripancreatic fluid collection. SPLEEN: Normal. ADRENALS/URINARY TRACT: The adrenal glands are normal. There is right-sided retroperitoneal stranding tracking from the right flank along the course of the right ureter, most evident at the level of the right common iliac bifurcation. The left ureter is normal. There is calcification and scarring of the left kidney upper pole. The urinary bladder is normal for degree of distention. There is a Foley catheter with the balloon inflated within the proximal penile urethra. STOMACH/BOWEL: There is no hiatal hernia. Normal  duodenal course and caliber. No small bowel dilatation or inflammation. No focal colonic  abnormality. Surgically absent. VASCULAR/LYMPHATIC: There is calcific atherosclerosis of the abdominal aorta. No abdominal or pelvic lymphadenopathy. REPRODUCTIVE: Markedly enlarged prostate measures 6.9 x 5.9 x 6.0 cm. MUSCULOSKELETAL. There is a comminuted fracture of the right femoral neck. There are bridging osteophytes of both sacroiliac joints. There are anterior lumbar vertebral enthesophytes and lower thoracic spinous process enthesophytes. There is moderate-to-severe foraminal stenosis bilaterally at L4-5 and L5-S1. OTHER: None. IMPRESSION: 1. Right-sided retroperitoneal stranding tracking from the right flank along the course of the right ureter, most evident at the level of the right common iliac bifurcation. Given the recent fall, this might be a sequela of trauma. Ascending urinary tract infection might also cause this appearance. 2. Foley catheter balloon is inflated within the proximal penile urethra. Removal is recommended. 3. Comminuted fracture of the right femoral neck. 4. Markedly enlarged prostate. 5. Aortic Atherosclerosis (ICD10-I70.0). Electronically Signed   By: Ulyses Jarred M.D.   On: 12/28/2019 03:42   US RENAL  Result Date: 12/30/2019 CLINICAL DATA:  Hematuria EXAM: RENAL / URINARY TRACT ULTRASOUND COMPLETE COMPARISON:  None. FINDINGS: Right Kidney: Renal measurements: 12 x 4.2 x 5.0 cm = volume: 134 mL. Mildly echogenic cortex. Left Kidney: Renal measurements: 10.9 x 4.5 x 4.9 cm = volume: 128 mL. Limited evaluation. Increased cortical echogenicity in a mildly nodular contour. Bladder: Decompressed with a Foley catheter. Other: None. IMPRESSION: 1. Mildly echogenic cortex bilaterally suggesting the possibility medical renal disease. Evaluation of the left kidney is limited. 2. The bladder is decompressed with a Foley catheter. Electronically Signed   By: Dorise Bullion III M.D   On: 12/30/2019 12:19   US Carotid Bilateral (at [facility] and [facility] only)  Result Date: 12/29/2019 CLINICAL  DATA:  Acute cerebellar infarct EXAM: BILATERAL CAROTID DUPLEX ULTRASOUND TECHNIQUE: Pearline Cables scale imaging, color Doppler and duplex ultrasound were performed of bilateral carotid and vertebral arteries in the neck. COMPARISON:  12/29/2019 FINDINGS: Criteria: Quantification of carotid stenosis is based on velocity parameters that correlate the residual internal carotid diameter with NASCET-based stenosis levels, using the diameter of the distal internal carotid lumen as the denominator for stenosis measurement. The following velocity measurements were obtained: RIGHT ICA: 101/15 cm/sec CCA: 0000000 cm/sec SYSTOLIC ICA/CCA RATIO:  1.3 ECA: 133 cm/sec LEFT ICA: 98/17 cm/sec CCA: 123456 cm/sec SYSTOLIC ICA/CCA RATIO:  1.0 ECA: 127 cm/sec RIGHT CAROTID ARTERY: Minor echogenic shadowing plaque formation. No hemodynamically significant right ICA stenosis, velocity elevation, or turbulent flow. Degree of narrowing less than 50%. RIGHT VERTEBRAL ARTERY:  Antegrade LEFT CAROTID ARTERY: Similar scattered minor echogenic plaque formation. No hemodynamically significant left ICA stenosis, velocity elevation, or turbulent flow. LEFT VERTEBRAL ARTERY:  Antegrade IMPRESSION: Minor carotid atherosclerosis. No hemodynamically significant ICA stenosis. Degree of narrowing less than 50% bilaterally by ultrasound criteria. Patent antegrade vertebral flow bilaterally Electronically Signed   By: Jerilynn Mages.  Shick M.D.   On: 12/29/2019 13:55   DG Chest Port 1 View  Result Date: 01/02/2020 CLINICAL DATA:  Shortness of breath. History of coronary artery disease, diabetes and hypertension. EXAM: PORTABLE CHEST 1 VIEW COMPARISON:  Radiographs 12/27/2019 and 01/01/2020. FINDINGS: 1314 hours. The heart size and mediastinal contours are stable. There is aortic and coronary artery atherosclerosis. There is interval improved aeration of the lung bases with mild residual atelectasis. No edema, confluent airspace opacity, pneumothorax or significant pleural  effusion. The bones appear unchanged. IMPRESSION: Interval improved aeration of the lung bases with mild residual  atelectasis. No acute findings. Electronically Signed   By: Richardean Sale M.D.   On: 01/02/2020 14:16   DG Chest Port 1 View  Result Date: 01/01/2020 CLINICAL DATA:  Metabolic encephalopathy, acute EXAM: PORTABLE CHEST 1 VIEW COMPARISON:  12/27/2019 FINDINGS: The heart size and mediastinal contours are within normal limits. Both lungs are clear. The visualized skeletal structures are unremarkable. IMPRESSION: No active disease. Electronically Signed   By: Kerby Moors M.D.   On: 01/01/2020 10:10   DG Chest Portable 1 View  Result Date: 12/27/2019 CLINICAL DATA:  Right hip pain, hypertension EXAM: PORTABLE CHEST 1 VIEW COMPARISON:  None. FINDINGS: Heart size is mildly enlarged. Aortic atherosclerosis. Both lungs are clear. The visualized skeletal structures are unremarkable. IMPRESSION: 1. No active cardiopulmonary disease. 2. Mild cardiomegaly. 3. Aortic atherosclerosis. Electronically Signed   By: Davina Poke D.O.   On: 12/27/2019 17:44   DG Knee Left Port  Result Date: 12/28/2019 CLINICAL DATA:  Left knee pain. EXAM: PORTABLE LEFT KNEE - 1-2 VIEW COMPARISON:  None. FINDINGS: No evidence of fracture, dislocation, or joint effusion. Advanced degenerative joint disease identified with marked medial compartment narrowing, subchondral sclerosis and marginal spur formation. No fractures or dislocations. IMPRESSION: 1. Advanced degenerative joint disease. 2. No acute findings. Electronically Signed   By: Kerby Moors M.D.   On: 12/28/2019 11:28   DG Knee Right Port  Result Date: 12/28/2019 CLINICAL DATA:  Right knee pain. EXAM: PORTABLE RIGHT KNEE - 1-2 VIEW COMPARISON:  None. FINDINGS: No acute fracture is identified on this single AP radiograph. There is severe medial compartment joint space narrowing with mild genu varus deformity. Moderate medial and lateral compartment marginal  osteophytosis is noted. There is mild diffuse soft tissue swelling. IMPRESSION: 1. Severe medial compartment osteoarthrosis. 2. No acute osseous abnormality identified. Electronically Signed   By: Logan Bores M.D.   On: 12/28/2019 11:42   ECHOCARDIOGRAM COMPLETE  Result Date: 12/30/2019   ECHOCARDIOGRAM REPORT   Patient Name:   LAZARO FIORENTINO Date of Exam: 12/29/2019 Medical Rec #:  [id number]        Height:       74.0 in Accession #:    [id number]       Weight:       234.0 lb Date of Birth:  [birth year]        BSA:          2.32 m Patient Age:    10 years         BP:           135/56 mmHg Patient Gender: M                HR:           76 bpm. Exam Location:  [facility] Procedure: 2D Echo Indications:     STROKE 434.91/ I163.9  History:         Patient has no prior history of Echocardiogram examinations.  Sonographer:     Arville Go RDCS Referring Phys:  [place] Diagnosing Phys: Bartholome Bill MD IMPRESSIONS  1. Left ventricular ejection fraction, by visual estimation, is 55 to 60%. The left ventricle has normal function. Left ventricular septal wall thickness was mildly increased. There is borderline left ventricular hypertrophy.  2. The left ventricle has no regional wall motion abnormalities.  3. Global right ventricle has normal systolic function.The right ventricular size is normal. No increase in right ventricular wall thickness.  4. Left atrial size was normal.  5. Right atrial size was normal.  6. The mitral valve is grossly normal. Trivial mitral valve regurgitation.  7. The tricuspid valve is grossly normal.  8. The aortic valve is tricuspid. Aortic valve regurgitation is not visualized.  9. The pulmonic valve was grossly normal. Pulmonic valve regurgitation is not visualized. 10. The atrial septum is grossly normal. FINDINGS  Left Ventricle: Left ventricular ejection fraction, by visual estimation, is 55 to 60%. The left ventricle has normal function. The left ventricle has no regional wall  motion abnormalities. There is borderline left ventricular hypertrophy. Right Ventricle: The right ventricular size is normal. No increase in right ventricular wall thickness. Global RV systolic function is has normal systolic function. Left Atrium: Left atrial size was normal in size. Right Atrium: Right atrial size was normal in size Pericardium: There is no evidence of pericardial effusion. Mitral Valve: The mitral valve is grossly normal. Trivial mitral valve regurgitation. Tricuspid Valve: The tricuspid valve is grossly normal. Tricuspid valve regurgitation is mild. Aortic Valve: The aortic valve is tricuspid. Aortic valve regurgitation is not visualized. Aortic valve peak gradient measures 8.6 mmHg. Pulmonic Valve: The pulmonic valve was grossly normal. Pulmonic valve regurgitation is not visualized. Pulmonic regurgitation is not visualized. Aorta: The aortic root is normal in size and structure. IAS/Shunts: The atrial septum is grossly normal.  LEFT VENTRICLE PLAX 2D LVIDd:         4.29 cm  Diastology LVIDs:         3.05 cm  LV e' lateral:   7.29 cm/s LV PW:         1.23 cm  LV E/e' lateral: 9.5 LV IVS:        1.21 cm  LV e' medial:    7.51 cm/s LVOT diam:     2.30 cm  LV E/e' medial:  9.2 LV SV:         46 ml LV SV Index:   19.43 LVOT Area:     4.15 cm  RIGHT VENTRICLE RV Basal diam:  3.09 cm RV S prime:     11.40 cm/s TAPSE (M-mode): 2.1 cm LEFT ATRIUM           Index      RIGHT ATRIUM           Index LA diam:      2.30 cm 0.99 cm/m RA Area:     15.70 cm LA Vol (A2C): 18.9 ml 8.13 ml/m RA Volume:   41.10 ml  17.69 ml/m LA Vol (A4C): 12.0 ml 5.16 ml/m  AORTIC VALVE                PULMONIC VALVE AV Area (Vmax): 2.46 cm    PV Vmax:       1.03 m/s AV Vmax:        147.00 cm/s PV Peak grad:  4.2 mmHg AV Peak Grad:   8.6 mmHg LVOT Vmax:      87.20 cm/s LVOT Vmean:     57.100 cm/s LVOT VTI:       0.166 m  AORTA Ao Root diam: 3.70 cm Ao Asc diam:  3.30 cm MV E velocity: 69.40 cm/s 103 cm/s  TRICUSPID VALVE MV  A velocity: 70.30 cm/s 70.3 cm/s TV Peak grad:   36.2 mmHg MV E/A ratio:  0.99       1.5       TV Vmax:        3.01 m/s  SHUNTS                                     Systemic VTI:  0.17 m                                     Systemic Diam: 2.30 cm  Bartholome Bill MD Electronically signed by Bartholome Bill MD Signature Date/Time: 12/30/2019/8:26:01 AM    Final    DG Hip Port Unilat With Pelvis 1V Right  Result Date: 12/28/2019 CLINICAL DATA:  79 year old male with a history of total hip replacement EXAM: DG HIP (WITH OR WITHOUT PELVIS) 1V PORT RIGHT COMPARISON:  12/27/2019 FINDINGS: Interval surgical changes of right hip arthroplasty. Gas and swelling at the surgical bed. Surgical staples project within the soft tissues. Urinary catheter in position. Left hip with degenerative changes.  Bony pelvic ring intact. IMPRESSION: Early surgical changes of right hip arthroplasty without complicating features. Urinary catheter. Electronically Signed   By: Corrie Mckusick D.O.   On: 12/28/2019 11:30   DG Hip Unilat W or Wo Pelvis 2-3 Views Right  Result Date: 12/27/2019 CLINICAL DATA:  Fall. EXAM: DG HIP (WITH OR WITHOUT PELVIS) 2-3V RIGHT COMPARISON:  No recent. FINDINGS: Diffuse severe osteopenia and degenerative change. Angulated fracture of the right femoral neck is noted. No evidence of dislocation. IMPRESSION: 1.  Angulated right femoral neck fracture. 2.  Diffuse severe osteopenia degenerative change. Electronically Signed   By: Marcello Moores  Register   On: 12/27/2019 16:16      Labs: BNP (last 3 results) No results for input(s): BNP in the last 8760 hours. Basic Metabolic Panel: Recent Labs  Lab 01/02/20 0906 01/03/20 0418 01/04/20 0856 01/05/20 0613 01/06/20 0521  NA 134* 134* 137 139 139  K 3.8 3.5 3.5 3.6 4.0  CL 98 100 105 106 106  CO2 24 23 22 23 25   GLUCOSE 118* 132* 120* 139* 142*  BUN 51* 61* 48* 37* 30*  CREATININE 1.80* 1.72* 1.35* 1.16 1.21  CALCIUM 9.3 9.2  9.7 10.2 10.3  MG  --  1.8  --  2.1  --    Liver Function Tests: Recent Labs  Lab 01/02/20 0906 01/03/20 0418 01/04/20 0856  AST 66* 53* 45*  ALT 41 35 36  ALKPHOS 173* 191* 187*  BILITOT 4.5* 3.8* 2.7*  PROT 6.1* 5.7* 5.9*  ALBUMIN 2.7* 2.6* 2.6*   No results for input(s): LIPASE, AMYLASE in the last 168 hours. No results for input(s): AMMONIA in the last 168 hours. CBC: Recent Labs  Lab 01/02/20 0906 01/03/20 0418 01/04/20 0856 01/05/20 0613 01/06/20 0521  WBC 37.5* 26.6* 20.6* 12.8* 11.2*  NEUTROABS 33.0* 22.3* 18.4*  --   --   HGB 8.3* 7.8* 8.3* 8.6* 8.6*  HCT 24.9* 23.2* 24.9* 26.4* 27.1*  MCV 87.4 86.2 87.1 87.7 89.1  PLT 179 155 179 194 205   Cardiac Enzymes: No results for input(s): CKTOTAL, CKMB, CKMBINDEX, TROPONINI in the last 168 hours. BNP: Invalid input(s): POCBNP CBG: Recent Labs  Lab 01/05/20 1229 01/05/20 1644 01/05/20 2106 01/06/20 0849 01/06/20 1205  GLUCAP 186* 145* 130* 140* 173*   D-Dimer No results for input(s): DDIMER in the last 72 hours. Hgb A1c No results for input(s): HGBA1C in the last 72 hours. Lipid Profile No results for input(s): CHOL, HDL, LDLCALC, TRIG, CHOLHDL, LDLDIRECT  in the last 72 hours. Thyroid function studies No results for input(s): TSH, T4TOTAL, T3FREE, THYROIDAB in the last 72 hours.  Invalid input(s): FREET3 Anemia work up No results for input(s): VITAMINB12, FOLATE, FERRITIN, TIBC, IRON, RETICCTPCT in the last 72 hours. Urinalysis    Component Value Date/Time   COLORURINE AMBER (A) 01/01/2020 2315   APPEARANCEUR CLOUDY (A) 01/01/2020 2315   LABSPEC 1.025 01/01/2020 2315   PHURINE 8.0 01/01/2020 2315   GLUCOSEU NEGATIVE 01/01/2020 2315   HGBUR MODERATE (A) 01/01/2020 2315   BILIRUBINUR NEGATIVE 01/01/2020 2315   KETONESUR NEGATIVE 01/01/2020 2315   PROTEINUR 100 (A) 01/01/2020 2315   NITRITE NEGATIVE 01/01/2020 2315   LEUKOCYTESUR MODERATE (A) 01/01/2020 2315   Sepsis Labs Invalid input(s):  PROCALCITONIN,  WBC,  LACTICIDVEN Microbiology Recent Results (from the past 240 hour(s))  SARS CORONAVIRUS 2 (TAT 6-24 HRS) Nasopharyngeal Nasopharyngeal Swab     Status: None   Collection Time: 12/27/19  5:19 PM   Specimen: Nasopharyngeal Swab  Result Value Ref Range Status   SARS Coronavirus 2 NEGATIVE NEGATIVE Final    Comment: (NOTE) SARS-CoV-2 target nucleic acids are NOT DETECTED. The SARS-CoV-2 RNA is generally detectable in upper and lower respiratory specimens during the acute phase of infection. Negative results do not preclude SARS-CoV-2 infection, do not rule out co-infections with other pathogens, and should not be used as the sole basis for treatment or other patient management decisions. Negative results must be combined with clinical observations, patient history, and epidemiological information. The expected result is Negative. Fact Sheet for Patients: SugarRoll.be Fact Sheet for Healthcare Providers: https://www.woods-mathews.com/ This test is not yet approved or cleared by the [place] FDA and  has been authorized for detection and/or diagnosis of SARS-CoV-2 by FDA under an Emergency Use Authorization (EUA). This EUA will remain  in effect (meaning this test can be used) for the duration of the COVID-19 declaration under Section 56 4(b)(1) of the Act, 21 U.S.C. section 360bbb-3(b)(1), unless the authorization is terminated or revoked sooner. Performed at [facility], [place] [address] [place], [place] [address]   SARS Coronavirus 2 by RT PCR (hospital order, performed in [place] hospital lab)     Status: None   Collection Time: 12/27/19 10:17 PM  Result Value Ref Range Status   SARS Coronavirus 2 NEGATIVE NEGATIVE Final    Comment: (NOTE) SARS-CoV-2 target nucleic acids are NOT DETECTED. The SARS-CoV-2 RNA is generally detectable in upper and lower respiratory specimens during the acute phase of  infection. The lowest concentration of SARS-CoV-2 viral copies this assay can detect is 250 copies / mL. A negative result does not preclude SARS-CoV-2 infection and should not be used as the sole basis for treatment or other patient management decisions.  A negative result may occur with improper specimen collection / handling, submission of specimen other than nasopharyngeal swab, presence of viral mutation(s) within the areas targeted by this assay, and inadequate number of viral copies (<250 copies / mL). A negative result must be combined with clinical observations, patient history, and epidemiological information. Fact Sheet for Patients:   StrictlyIdeas.no Fact Sheet for Healthcare Providers: BankingDealers.co.za This test is not yet approved or cleared  by the [place] FDA and has been authorized for detection and/or diagnosis of SARS-CoV-2 by FDA under an Emergency Use Authorization (EUA).  This EUA will remain in effect (meaning this test can be used) for the duration of the COVID-19 declaration under Section 564(b)(1) of the Act, 21 U.S.C. section  360bbb-3(b)(1), unless the authorization is terminated or revoked sooner. Performed at [facility], [address] [place], [place] [address]   CULTURE, BLOOD (ROUTINE X 2) w Reflex to ID Panel     Status: Abnormal   Collection Time: 01/02/20  1:24 PM   Specimen: BLOOD  Result Value Ref Range Status   Specimen Description   Final    BLOOD BLOOD RIGHT HAND Performed at [facility], [address], [place], [place] [address]    Special Requests   Final    BOTTLES DRAWN AEROBIC AND ANAEROBIC Blood Culture results may not be optimal due to an inadequate volume of blood received in culture bottles Performed at [facility], [address], [place], [place] [address]    Culture  Setup Time   Final    GRAM NEGATIVE RODS IN BOTH AEROBIC AND ANAEROBIC  BOTTLES CRITICAL RESULT CALLED TO, READ BACK BY AND VERIFIED WITH: [place] ON 01/03/20 RWW Performed at [facility], [place] [place], [place] [address]    Culture (A)  Final    PROTEUS VULGARIS SUSCEPTIBILITIES PERFORMED ON PREVIOUS CULTURE WITHIN THE LAST 5 DAYS. Performed at [facility], [place] [address] [place], [place] [address]    Report Status 01/05/2020 FINAL  Final  CULTURE, BLOOD (ROUTINE X 2) w Reflex to ID Panel     Status: Abnormal   Collection Time: 01/02/20  1:38 PM   Specimen: BLOOD  Result Value Ref Range Status   Specimen Description   Final    BLOOD BLOOD LEFT HAND Performed at [facility], [address] [place], [place] [address]    Special Requests   Final    BOTTLES DRAWN AEROBIC AND ANAEROBIC Blood Culture adequate volume Performed at [facility], [address] [place], [place] [address]    Culture  Setup Time   Final    IN BOTH AEROBIC AND ANAEROBIC BOTTLES GRAM NEGATIVE RODS CRITICAL RESULT CALLED TO, READ BACK BY AND VERIFIED WITH: [place] ON 01/03/20 RWW Performed at [facility], [place] [address] [place], [place] [address]    Culture PROTEUS VULGARIS (A)  Final   Report Status 01/05/2020 FINAL  Final   Organism ID, Bacteria PROTEUS VULGARIS  Final      Susceptibility   Proteus vulgaris - MIC*    AMPICILLIN >=32 RESISTANT Resistant     CEFAZOLIN >=64 RESISTANT Resistant     CEFEPIME 0.25 SENSITIVE Sensitive     CEFTAZIDIME <=1 SENSITIVE Sensitive     CIPROFLOXACIN <=0.25 SENSITIVE Sensitive     GENTAMICIN <=1 SENSITIVE Sensitive     IMIPENEM 4 SENSITIVE Sensitive     TRIMETH/SULFA <=20 SENSITIVE Sensitive     AMPICILLIN/SULBACTAM >=32 RESISTANT Resistant     PIP/TAZO <=4 SENSITIVE Sensitive     * PROTEUS VULGARIS  Blood Culture ID Panel (Reflexed)     Status: Abnormal   Collection Time: 01/02/20  1:38 PM  Result Value Ref Range Status   Enterococcus species NOT  DETECTED NOT DETECTED Final   Listeria monocytogenes NOT DETECTED NOT DETECTED Final   Staphylococcus species NOT DETECTED NOT DETECTED Final   Staphylococcus aureus (BCID) NOT DETECTED NOT DETECTED Final   Streptococcus species NOT DETECTED NOT DETECTED Final   Streptococcus agalactiae NOT DETECTED NOT DETECTED Final   Streptococcus pneumoniae NOT DETECTED NOT DETECTED Final   Streptococcus pyogenes NOT DETECTED NOT DETECTED Final   Acinetobacter baumannii NOT DETECTED  NOT DETECTED Final   Enterobacteriaceae species DETECTED (A) NOT DETECTED Final    Comment: Enterobacteriaceae represent a large family of gram-negative bacteria, not a single organism. CRITICAL RESULT CALLED TO, READ BACK BY AND VERIFIED WITH: [place] ON 01/03/20 RWW    Enterobacter cloacae complex NOT DETECTED NOT DETECTED Final   Escherichia coli NOT DETECTED NOT DETECTED Final   Klebsiella oxytoca NOT DETECTED NOT DETECTED Final   Klebsiella pneumoniae NOT DETECTED NOT DETECTED Final   Proteus species DETECTED (A) NOT DETECTED Final    Comment: CRITICAL RESULT CALLED TO, READ BACK BY AND VERIFIED WITH: DAVID BESANTI AT 0410 ON 01/03/20 RWW    Serratia marcescens NOT DETECTED NOT DETECTED Final   Carbapenem resistance NOT DETECTED NOT DETECTED Final   Haemophilus influenzae NOT DETECTED NOT DETECTED Final   Neisseria meningitidis NOT DETECTED NOT DETECTED Final   Pseudomonas aeruginosa NOT DETECTED NOT DETECTED Final   Candida albicans NOT DETECTED NOT DETECTED Final   Candida glabrata NOT DETECTED NOT DETECTED Final   Candida krusei NOT DETECTED NOT DETECTED Final   Candida parapsilosis NOT DETECTED NOT DETECTED Final   Candida tropicalis NOT DETECTED NOT DETECTED Final    Comment: Performed at [facility], [address] [place], [place] [address]  Urine Culture     Status: Abnormal   Collection Time: 01/03/20  1:50 AM   Specimen: Urine, Random  Result Value Ref Range Status   Specimen  Description   Final    URINE, RANDOM Performed at [facility], [address], [place], [place] [address]    Special Requests   Final    NONE Performed at [facility], [address], [place], [place] [address]    Culture (A)  Final    <10,000 COLONIES/mL INSIGNIFICANT GROWTH Performed at [facility], [place] [address] [place], [place] [address]    Report Status 01/04/2020 FINAL  Final  SARS CORONAVIRUS 2 (TAT 6-24 HRS) Nasopharyngeal Nasopharyngeal Swab     Status: None   Collection Time: 01/05/20  6:38 PM   Specimen: Nasopharyngeal Swab  Result Value Ref Range Status   SARS Coronavirus 2 NEGATIVE NEGATIVE Final    Comment: (NOTE) SARS-CoV-2 target nucleic acids are NOT DETECTED. The SARS-CoV-2 RNA is generally detectable in upper and lower respiratory specimens during the acute phase of infection. Negative results do not preclude SARS-CoV-2 infection, do not rule out co-infections with other pathogens, and should not be used as the sole basis for treatment or other patient management decisions. Negative results must be combined with clinical observations, patient history, and epidemiological information. The expected result is Negative. Fact Sheet for Patients: SugarRoll.be Fact Sheet for Healthcare Providers: https://www.woods-mathews.com/ This test is not yet approved or cleared by the [place] FDA and  has been authorized for detection and/or diagnosis of SARS-CoV-2 by FDA under an Emergency Use Authorization (EUA). This EUA will remain  in effect (meaning this test can be used) for the duration of the COVID-19 declaration under Section 56 4(b)(1) of the Act, 21 U.S.C. section 360bbb-3(b)(1), unless the authorization is terminated or revoked sooner. Performed at [facility], [place] [address], [place], [place] [address]      Total time spend on discharging this patient, including the last  patient exam, discussing the hospital stay, instructions for ongoing care as it relates to all pertinent caregivers, as well as preparing the medical discharge records, prescriptions, and/or referrals as applicable, is 45 minutes.  Enzo Bi, MD  [facility] 01/06/2020, 12:25 PM  If 7PM-7AM, please contact night-coverage

## 2020-01-07 ENCOUNTER — Ambulatory Visit: Payer: Self-pay | Admitting: Physician Assistant

## 2020-01-07 ENCOUNTER — Telehealth: Payer: Self-pay

## 2020-01-07 ENCOUNTER — Encounter: Payer: Self-pay | Admitting: Physician Assistant

## 2020-01-07 DIAGNOSIS — I639 Cerebral infarction, unspecified: Secondary | ICD-10-CM

## 2020-01-07 NOTE — Telephone Encounter (Signed)
Patients sister in law calling in regarding the prior phone calls. Patient is currently at [facility] as of last night  Please call when able

## 2020-01-07 NOTE — Telephone Encounter (Addendum)
Spoke with the patients sister in Engineer, materials. Advised Grant Mcdaniel that it is recommended that the patient wear a cardiac monitor after his recent CVA to r/o arrhythmia as the cause.  Grant Mcdaniel that the monitor will need to be mailed to an address where someone can receive it and then take it to the patient @ the SNF to be applied.  Grant Mcdaniel provided her and her husbands address. Grant Mcdaniel  [place], [place] [address] Grant Mcdaniel that she will receive a call from [employer] to verify the mailing address again. Once received they will take the monitor to [facility] so that it can be applied to the patient.  Monitor order placed on [employer] website.

## 2020-01-07 NOTE — Telephone Encounter (Signed)
Per Dr. Garen Lah. Patient was d/c from [facility] on 01/06/20 to a SNF [facility] [phone]. Patient will need a 14 day zio XT for dx CVA.  Attempted to contact [facility] x 4.  1st and 2nd attempt the call was answered by the receptionist and d/c when being transferred to the nurse. 3rd attempt held the line for > 5 mins, phone rings out.  4th attempt the line rings out held >5 min.  Called the patient's emergency contact his brother Grant Mcdaniel. lmtcb.

## 2020-01-25 NOTE — Telephone Encounter (Addendum)
Contacted the patients sister in law Wells Guiles to follow up on whether the zio monitor that was ordered to be mailed to their home was received and taken to the patients SNF to be applied to the patient. lmtcb if assistance is needed.

## 2020-01-28 NOTE — Telephone Encounter (Signed)
Wells Guiles calling in stating husband took monitor to patients SNF ([facility]) approximately two weeks ago.   Please advise if needed

## 2020-01-29 NOTE — Telephone Encounter (Signed)
Sounds like if its been 14 days, then it can be removed and mailed back.  Attempted to reach Grant Mcdaniel to see if they had removed the monitor and mailed it back to [name] yet. No answer. Left message to call us back.

## 2020-01-30 NOTE — Telephone Encounter (Signed)
Received call back from [facility]. She apologized because she did not realize I was a nurse calling from a doctor's office. She checked patient and he was not wearing a monitor at this time. She will relook in the patient's room to see if the [name] monitor is in there. They have other patient's wearing [name]'s so she is familiar with it. She needs order to place the monitor faxed to [phone]. She will let the family know if they do not find the monitor.

## 2020-01-30 NOTE — Telephone Encounter (Signed)
Sister says they are unable to get in touch of the patient who is at [facility] at this time. Patient is having cognitive issues since his fall and hospitalization. He is not able to comprehend things as he did prior. She and patient's brother have trouble trying to get in touch with the staff and the patient. They do not know if the patient is wearing or wore the monitor.   Attempted to call [facility]. Spoke with the nurse [facility] and she said she would need permission from [place] ([facility] and patient's brother). Called Wells Guiles back and let her know. She will have Rosanna Randy call.

## 2020-01-30 NOTE — Telephone Encounter (Signed)
Patients sister calling back  Can be reached at [phone] before 3 pm  Please advise

## 2020-01-30 NOTE — Telephone Encounter (Signed)
Order stating to apply [name] monitor to patient for 14 days faxed to [facility] [phone].

## 2020-03-04 NOTE — Telephone Encounter (Signed)
The patients zio-monitor was never returned by the patients skilled nursing facility and is deemed "lost" on the [name] website. He was never scheduled for f/u with Cardiology after his d/c from [facility] in Jan 2021.  Message fwd to Dr. Garen Lah to advise on whether f/u with Cardiology is needed.

## 2020-03-06 NOTE — Telephone Encounter (Signed)
Kate Sable, MD  You 20 minutes ago (2:27 PM)   Patient has a history of CVA. He can follow-up with [place] to establish care so cardiac monitor be placed to evaluate presence of atrial fibrillation. Thank you   Routing comment    Message fwd to scheduling to contact the patient or SNF to schedule an appointment with Dr. Garen Lah.

## 2020-03-07 ENCOUNTER — Telehealth: Payer: Self-pay

## 2020-03-07 NOTE — Telephone Encounter (Signed)
Attempted to schedule with [facility] .  Patient dc home.    Spoke with patient and he declined to schedule at this time but wants a call back next week.   A referral will be placed .  Who is the ordering md ?

## 2020-03-07 NOTE — Telephone Encounter (Signed)
-----   Message from Lamar Laundry, RN sent at 03/06/2020  2:48 PM EDT ----- Regarding: Patient needs to an appt to establish care Please contact this patient or [facility] to schedule an appt with Dr. Garen Lah to establish care.

## 2020-03-10 NOTE — Telephone Encounter (Signed)
Routing to scheduling

## 2020-03-10 NOTE — Telephone Encounter (Signed)
Attempted to schedule.  LMOV to call office.  ° °

## 2020-05-09 ENCOUNTER — Ambulatory Visit: Payer: Medicare Other | Admitting: Cardiology

## 2020-05-23 ENCOUNTER — Other Ambulatory Visit (INDEPENDENT_AMBULATORY_CARE_PROVIDER_SITE_OTHER): Payer: Self-pay | Admitting: Nurse Practitioner

## 2020-05-23 DIAGNOSIS — I8390 Asymptomatic varicose veins of unspecified lower extremity: Secondary | ICD-10-CM

## 2020-05-26 ENCOUNTER — Ambulatory Visit (INDEPENDENT_AMBULATORY_CARE_PROVIDER_SITE_OTHER): Payer: Medicare Other | Admitting: Nurse Practitioner

## 2020-05-26 ENCOUNTER — Ambulatory Visit (INDEPENDENT_AMBULATORY_CARE_PROVIDER_SITE_OTHER): Payer: Medicare Other

## 2020-05-26 ENCOUNTER — Encounter (INDEPENDENT_AMBULATORY_CARE_PROVIDER_SITE_OTHER): Payer: Self-pay

## 2020-05-26 ENCOUNTER — Ambulatory Visit (INDEPENDENT_AMBULATORY_CARE_PROVIDER_SITE_OTHER): Payer: Medicare Other | Admitting: Podiatry

## 2020-05-26 ENCOUNTER — Encounter: Payer: Self-pay | Admitting: Podiatry

## 2020-05-26 ENCOUNTER — Encounter (INDEPENDENT_AMBULATORY_CARE_PROVIDER_SITE_OTHER): Payer: Self-pay | Admitting: Nurse Practitioner

## 2020-05-26 ENCOUNTER — Other Ambulatory Visit: Payer: Self-pay

## 2020-05-26 ENCOUNTER — Ambulatory Visit: Payer: Self-pay | Admitting: Podiatry

## 2020-05-26 VITALS — BP 161/76 | HR 121 | Ht 74.0 in | Wt 212.0 lb

## 2020-05-26 DIAGNOSIS — E1122 Type 2 diabetes mellitus with diabetic chronic kidney disease: Secondary | ICD-10-CM | POA: Diagnosis not present

## 2020-05-26 DIAGNOSIS — E119 Type 2 diabetes mellitus without complications: Secondary | ICD-10-CM

## 2020-05-26 DIAGNOSIS — B351 Tinea unguium: Secondary | ICD-10-CM

## 2020-05-26 DIAGNOSIS — N183 Chronic kidney disease, stage 3 unspecified: Secondary | ICD-10-CM

## 2020-05-26 DIAGNOSIS — I10 Essential (primary) hypertension: Secondary | ICD-10-CM | POA: Diagnosis not present

## 2020-05-26 DIAGNOSIS — I739 Peripheral vascular disease, unspecified: Secondary | ICD-10-CM

## 2020-05-26 DIAGNOSIS — E785 Hyperlipidemia, unspecified: Secondary | ICD-10-CM | POA: Diagnosis not present

## 2020-05-26 DIAGNOSIS — I890 Lymphedema, not elsewhere classified: Secondary | ICD-10-CM

## 2020-05-26 DIAGNOSIS — L03119 Cellulitis of unspecified part of limb: Secondary | ICD-10-CM

## 2020-05-26 DIAGNOSIS — D689 Coagulation defect, unspecified: Secondary | ICD-10-CM | POA: Insufficient documentation

## 2020-05-26 DIAGNOSIS — M79674 Pain in right toe(s): Secondary | ICD-10-CM

## 2020-05-26 DIAGNOSIS — I8390 Asymptomatic varicose veins of unspecified lower extremity: Secondary | ICD-10-CM

## 2020-05-26 DIAGNOSIS — N400 Enlarged prostate without lower urinary tract symptoms: Secondary | ICD-10-CM

## 2020-05-26 DIAGNOSIS — M79675 Pain in left toe(s): Secondary | ICD-10-CM

## 2020-05-26 HISTORY — DX: Type 2 diabetes mellitus without complications: E11.9

## 2020-05-26 HISTORY — DX: Coagulation defect, unspecified: D68.9

## 2020-05-26 HISTORY — DX: Tinea unguium: B35.1

## 2020-05-26 HISTORY — DX: Benign prostatic hyperplasia without lower urinary tract symptoms: N40.0

## 2020-05-26 MED ORDER — CEPHALEXIN 500 MG PO CAPS: 500.0000 mg | ORAL_CAPSULE | Freq: Four times a day (QID) | ORAL | 0 refills | Status: DC

## 2020-05-26 NOTE — Progress Notes (Signed)
This patient returns to my office for at risk foot care.  This patient requires this care by a professional since this patient will be at risk due to having diagnosis of pvd, coagulation defect  and CKD stage 3. He is taking plavix.   He is accompanied by caregiver from his home.This patient is wearing unna boots both feet/legs.  This patient is unable to cut nails himself since the patient cannot reach his nails.These nails are painful walking and wearing shoes.  This patient presents for at risk foot care today.  General Appearance  Alert, conversant and in no acute stress.  Vascular  Deferred due to unna boots.  Neurologic  Deferred due to unna boots  Nails Thick disfigured discolored nails with subungual debris  from hallux to fifth toes bilaterally. No evidence of bacterial infection or drainage bilaterally.  Orthopedic  No limitations of motion  feet .  No crepitus or effusions noted.  No bony pathology or digital deformities noted.  Skin  normotropic skin with no porokeratosis noted bilaterally.  No signs of infections or ulcers noted.     Onychomycosis  Pain in right toes  Pain in left toes  Consent was obtained for treatment procedures.   Mechanical debridement of nails 1-5  bilaterally performed with a nail nipper.  Filed with dremel without incident.    Return office visit  3 months                    Told patient to return for periodic foot care and evaluation due to potential at risk complications.   Gardiner Barefoot DPM

## 2020-05-26 NOTE — Progress Notes (Signed)
Subjective:    Patient ID: Grant Mcdaniel, male    DOB: September 13, 1941, 79 y.o.   MRN: [id number] Chief Complaint  Patient presents with  . New Patient (Initial Visit)     Varicose Veins W/ pain is 2016    Patient is seen for evaluation of leg pain and swelling associated with new onset ulceration. The patient first noticed the swelling remotely. The swelling is associated with pain and discoloration. The pain and swelling worsens with prolonged dependency and improves with elevation. The pain is unrelated to activity.  The patient notes that in the morning the legs are better but the leg symptoms worsened throughout the course of the day. The patient has also noted a progressive worsening of the discoloration in the ankle and shin area.   The patient notes that an ulcer has developed acutely without specific trauma and since it occurred it has been very slow to heal.  There is a moderate amount of drainage associated with the open area.  The wound is also very painful.  The patient denies claudication symptoms or rest pain symptoms.  The patient denies DJD and LS spine disease.  The patient has not had any past angiography, interventions or vascular surgery.  Elevation makes the leg symptoms better, dependency makes them much worse. The patient denies any recent changes in medications.  The patient was previously seen here for the same issue in 2016.  Prior to that he has been wearing medical grade 1 compression.  The patient denies a history of DVT or PE. There is no prior history of phlebitis. There is no history of primary lymphedema.  No history of malignancies. No history of trauma or groin or pelvic surgery. There is no history of radiation treatment to the groin or pelvis   Today the patient underwent noninvasive studies which showed no evidence of DVT or superficial venous thrombosis bilaterally.  There did appear to be enlarged lymph nodes in the bilateral groin areas.  The  patient has evidence of superficial venous reflux in the great saphenous vein at the saphenofemoral junction in the left lower extremity.  The right lower extremity has no evidence of venous reflux.  The bilateral lower extremities had no evidence of deep venous insufficiency.     Review of Systems  Cardiovascular: Positive for leg swelling.  Skin: Positive for color change, rash and wound.  All other systems reviewed and are negative.      Objective:   Physical Exam Vitals reviewed.  HENT:     Head: Normocephalic.  Cardiovascular:     Rate and Rhythm: Normal rate and regular rhythm.  Pulmonary:     Effort: Pulmonary effort is normal.     Breath sounds: Normal breath sounds.  Musculoskeletal:        General: Normal range of motion.     Right lower leg: 2+ Pitting Edema present.     Left lower leg: 2+ Pitting Edema present.  Skin:    Findings: Erythema and rash present.  Neurological:     Mental Status: He is alert and oriented to person, place, and time.  Psychiatric:        Mood and Affect: Mood normal.        Behavior: Behavior normal.        Thought Content: Thought content normal.        Judgment: Judgment normal.     BP (!) 161/76   Pulse (!) 121   Ht 6\' 2"  (1.88 m)  Wt 212 lb (96.2 kg)   BMI 27.22 kg/m   Past Medical History:  Diagnosis Date  . CAD (coronary artery disease)   . 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 Diabetes mellitus without complication ([place])   . Hypercholesterolemia   . Hypertension   . Hypertension   . Hypothyroid   . PVD (peripheral vascular disease) ([place])   . Venous insufficiency     Social History   Socioeconomic History  . Marital status: Married    Spouse name: Not on file  . Number of children: Not on file  . Years of education: Not on file  . Highest education level: Not on file  Occupational History  . Not on file  Tobacco Use  . Smoking status: Never Smoker  . Smokeless tobacco: Never Used    Substance and Sexual Activity  . Alcohol use: Never  . Drug use: Never  . Sexual activity: Not on file  Other Topics Concern  . Not on file  Social History Narrative  . Not on file   Social Determinants of Health   Financial Resource Strain:   . Difficulty of Paying Living Expenses:   Food Insecurity:   . Worried About Charity fundraiser in the Last Year:   . Arboriculturist in the Last Year:   Transportation Needs:   . Film/video editor (Medical):   Marland Kitchen Lack of Transportation (Non-Medical):   Physical Activity:   . Days of Exercise per Week:   . Minutes of Exercise per Session:   Stress:   . Feeling of Stress :   Social Connections:   . Frequency of Communication with Friends and Family:   . Frequency of Social Gatherings with Friends and Family:   . Attends Religious Services:   . Active Member of Clubs or Organizations:   . Attends Archivist Meetings:   Marland Kitchen Marital Status:   Intimate Partner Violence:   . Fear of Current or Ex-Partner:   . Emotionally Abused:   Marland Kitchen Physically Abused:   . Sexually Abused:     Past Surgical History:  Procedure Laterality Date  . galbladder    . HIP ARTHROPLASTY Right 12/28/2019   Procedure: ARTHROPLASTY BIPOLAR HIP (HEMIARTHROPLASTY);  Surgeon: Thornton Park, MD;  Location: [facility] ORS;  Service: Orthopedics;  Laterality: Right;    Family History  Problem Relation Age of Onset  . Cancer Mother   . CAD Father     Allergies  Allergen Reactions  . Sulfa Antibiotics Nausea And Vomiting       Assessment & Plan:   1. Cellulitis of lower extremity, unspecified laterality Patient has evidence of cellulitis bilaterally.  In addition to the antibiotics prescribed, we will place the patient in bilateral Unna wraps.  The patient is instructed that these should remain in place for a week and they are not to get wet.  The patient will present to the office on a weekly basis to have this change.  We will have the patient  return in 4 weeks to follow-up with evaluation of his bilateral leg swelling and edema. - cephALEXin (KEFLEX) 500 MG capsule; Take 1 capsule (500 mg total) by mouth 4 (four) times daily.  Dispense: 28 capsule; Refill: 0  2. Lymphedema I have had a long discussion with the patient regarding swelling and why it  causes symptoms.  Patient will begin wearing graduated compression stockings class 1 (20-30 mmHg) on a daily basis a  prescription was given. The patient will  beginning wearing the stockings first thing in the morning and removing them in the evening. The patient is instructed specifically not to sleep in the stockings, once he has transition from [employer] wraps. In addition, behavioral modification will be initiated.  This will include frequent elevation, use of over the counter pain medications and exercise such as walking.  I have reviewed systemic causes for chronic edema such as liver, kidney and cardiac etiologies.  The patient denies problems with these organ systems.    Consideration for a lymph pump will also be made based upon the effectiveness of conservative therapy.  This would help to improve the edema control and prevent sequela such as ulcers and infections     3. Essential hypertension Continue antihypertensive medications as already ordered, these medications have been reviewed and there are no changes at this time.   4. Hyperlipidemia, unspecified hyperlipidemia type Continue statin as ordered and reviewed, no changes at this time    Current Outpatient Medications on File Prior to Visit  Medication Sig Dispense Refill  . amLODipine-benazepril (LOTREL) 10-40 MG capsule Take 1 capsule by mouth daily.     Marland Kitchen aspirin EC 81 MG tablet Take 81 mg by mouth at bedtime.     Marland Kitchen atorvastatin (LIPITOR) 40 MG tablet Take 40 mg by mouth at bedtime.     . clopidogrel (PLAVIX) 75 MG tablet Take 75 mg by mouth daily.    . Coenzyme Q10 10 MG capsule Take 10 mg by mouth daily.     Marland Kitchen  levothyroxine (SYNTHROID) 75 MCG tablet Take 75 mcg by mouth daily before breakfast.     . linagliptin (TRADJENTA) 5 MG TABS tablet Take 5 mg by mouth daily.     . Multiple Vitamin (MULTIVITAMIN WITH MINERALS) TABS tablet Take 1 tablet by mouth daily.    . Omega-3 Fatty Acids (FISH OIL TRIPLE STRENGTH) 1400 MG CAPS Take 1 capsule by mouth daily.    . tamsulosin (FLOMAX) 0.4 MG CAPS capsule Take 0.4 mg by mouth daily.     . vitamin B-12 (CYANOCOBALAMIN) 500 MCG tablet Take 500 mcg by mouth daily.    . bisoprolol-hydrochlorothiazide (ZIAC) 10-6.25 MG tablet Take 1 tablet by mouth daily.    . Cholecalciferol 25 MCG (1000 UT) capsule Take 1,000 Units by mouth daily.     . fluticasone (FLONASE) 50 MCG/ACT nasal spray Place 1 spray into both nostrils daily.    . hydrochlorothiazide (MICROZIDE) 12.5 MG capsule Take 12.5 mg by mouth daily.    Marland Kitchen loratadine (CLARITIN) 10 MG tablet Take 10 mg by mouth daily.     No current facility-administered medications on file prior to visit.    There are no Patient Instructions on file for this visit. No follow-ups on file.   Kris Hartmann, NP

## 2020-05-27 ENCOUNTER — Ambulatory Visit: Payer: Medicare Other | Admitting: Cardiology

## 2020-05-28 ENCOUNTER — Encounter: Payer: Self-pay | Admitting: Cardiology

## 2020-06-02 ENCOUNTER — Other Ambulatory Visit: Payer: Self-pay

## 2020-06-02 ENCOUNTER — Encounter (INDEPENDENT_AMBULATORY_CARE_PROVIDER_SITE_OTHER): Payer: Self-pay

## 2020-06-02 ENCOUNTER — Ambulatory Visit (INDEPENDENT_AMBULATORY_CARE_PROVIDER_SITE_OTHER): Payer: Medicare Other | Admitting: Nurse Practitioner

## 2020-06-02 VITALS — BP 147/66 | HR 114 | Resp 16 | Wt 212.0 lb

## 2020-06-02 DIAGNOSIS — I890 Lymphedema, not elsewhere classified: Secondary | ICD-10-CM

## 2020-06-02 NOTE — Progress Notes (Signed)
History of Present Illness  There is no documented history at this time  Assessments & Plan   There are no diagnoses linked to this encounter.    Additional instructions  Subjective:  Patient presents with venous ulcer of the Bilateral lower extremity.    Procedure:  3 layer unna wrap was placed Bilateral lower extremity.   Plan:   Follow up in one week.  

## 2020-06-09 ENCOUNTER — Ambulatory Visit (INDEPENDENT_AMBULATORY_CARE_PROVIDER_SITE_OTHER): Payer: Medicare Other | Admitting: Nurse Practitioner

## 2020-06-09 ENCOUNTER — Other Ambulatory Visit: Payer: Self-pay

## 2020-06-09 VITALS — BP 151/76 | HR 116 | Ht 72.0 in | Wt 207.0 lb

## 2020-06-09 DIAGNOSIS — I890 Lymphedema, not elsewhere classified: Secondary | ICD-10-CM

## 2020-06-09 NOTE — Progress Notes (Signed)
History of Present Illness  There is no documented history at this time  Assessments & Plan   There are no diagnoses linked to this encounter.    Additional instructions  Subjective:  Patient presents with venous ulcer of the Bilateral lower extremity.    Procedure:  3 layer unna wrap was placed Bilateral lower extremity.   Plan:   Follow up in one week.  

## 2020-06-10 ENCOUNTER — Encounter (INDEPENDENT_AMBULATORY_CARE_PROVIDER_SITE_OTHER): Payer: Self-pay | Admitting: Nurse Practitioner

## 2020-06-16 ENCOUNTER — Other Ambulatory Visit: Payer: Self-pay

## 2020-06-16 ENCOUNTER — Ambulatory Visit (INDEPENDENT_AMBULATORY_CARE_PROVIDER_SITE_OTHER): Payer: Medicare Other | Admitting: Nurse Practitioner

## 2020-06-16 ENCOUNTER — Encounter (INDEPENDENT_AMBULATORY_CARE_PROVIDER_SITE_OTHER): Payer: Self-pay | Admitting: Nurse Practitioner

## 2020-06-16 VITALS — BP 154/76 | HR 111 | Ht 74.0 in | Wt 209.0 lb

## 2020-06-16 DIAGNOSIS — I890 Lymphedema, not elsewhere classified: Secondary | ICD-10-CM

## 2020-06-16 NOTE — Progress Notes (Signed)
History of Present Illness  There is no documented history at this time  Assessments & Plan   There are no diagnoses linked to this encounter.    Additional instructions  Subjective:  Patient presents with venous ulcer of the Bilateral lower extremity.    Procedure:  3 layer unna wrap was placed Bilateral lower extremity.   Plan:   Follow up in one week.  

## 2020-06-24 ENCOUNTER — Ambulatory Visit (INDEPENDENT_AMBULATORY_CARE_PROVIDER_SITE_OTHER): Payer: Medicare Other | Admitting: Nurse Practitioner

## 2020-06-24 ENCOUNTER — Encounter (INDEPENDENT_AMBULATORY_CARE_PROVIDER_SITE_OTHER): Payer: Self-pay | Admitting: Nurse Practitioner

## 2020-06-24 ENCOUNTER — Other Ambulatory Visit: Payer: Self-pay

## 2020-06-24 VITALS — BP 149/77 | HR 97 | Resp 16 | Wt 210.6 lb

## 2020-06-24 DIAGNOSIS — E785 Hyperlipidemia, unspecified: Secondary | ICD-10-CM | POA: Diagnosis not present

## 2020-06-24 DIAGNOSIS — I890 Lymphedema, not elsewhere classified: Secondary | ICD-10-CM | POA: Diagnosis not present

## 2020-06-24 DIAGNOSIS — I10 Essential (primary) hypertension: Secondary | ICD-10-CM | POA: Diagnosis not present

## 2020-06-24 NOTE — Progress Notes (Signed)
Subjective:    Patient ID: Grant Mcdaniel, male    DOB: 07/29/1941, 79 y.o.   MRN: [id number] Chief Complaint  Patient presents with  . Follow-up    unna check    Patient is seen for evaluation of leg pain and swelling associated with new onset ulceration. The patient first noticed the swelling remotely. The swelling is associated with pain and discoloration. The pain and swelling worsens with prolonged dependency and improves with elevation. The pain is unrelated to activity.  The patient notes that in the morning the legs are better but the leg symptoms worsened throughout the course of the day. The patient has also noted a progressive worsening of the discoloration in the ankle and shin area.   The patient has been in bilateral Unna wraps for the last 4 weeks and the swelling is greatly improved and all of the previous ulcerations have completely healed.  The patient states that his lower extremities do feel much better following Unna wrap however the patient is also ready to be out of Unna wraps.  The patient does have a lymphedema pump at home, as well as medical grade 1 compression stockings.  He denies any fever, chills, nausea or vomiting since the patient's last office visit.     Review of Systems  Cardiovascular: Positive for leg swelling.  Neurological: Positive for weakness.  All other systems reviewed and are negative.      Objective:   Physical Exam Vitals reviewed.  HENT:     Head: Normocephalic.  Cardiovascular:     Rate and Rhythm: Normal rate and regular rhythm.     Pulses: Normal pulses.     Heart sounds: Normal heart sounds.  Pulmonary:     Effort: Pulmonary effort is normal.     Breath sounds: Normal breath sounds.  Musculoskeletal:     Right lower leg: 1+ Edema present.     Left lower leg: 1+ Edema present.  Skin:    General: Skin is warm and dry.     Capillary Refill: Capillary refill takes less than 2 seconds.  Neurological:     Mental Status: He  is alert and oriented to person, place, and time.     Motor: Weakness present.  Psychiatric:        Mood and Affect: Mood normal.        Behavior: Behavior normal.        Thought Content: Thought content normal.        Judgment: Judgment normal.     BP (!) 149/77 (BP Location: Right Arm)   Pulse 97   Resp 16   Wt 210 lb 9.6 oz (95.5 kg)   BMI 27.04 kg/m   Past Medical History:  Diagnosis Date  . CAD (coronary artery disease)   . 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 Diabetes mellitus without complication ([place])   . Hypercholesterolemia   . Hypertension   . Hypertension   . Hypothyroid   . PVD (peripheral vascular disease) ([place])   . Venous insufficiency     Social History   Socioeconomic History  . Marital status: Married    Spouse name: Not on file  . Number of children: Not on file  . Years of education: Not on file  . Highest education level: Not on file  Occupational History  . Not on file  Tobacco Use  . Smoking status: Never Smoker  . Smokeless tobacco: Never Used  Substance and  Sexual Activity  . Alcohol use: Never  . Drug use: Never  . Sexual activity: Not on file  Other Topics Concern  . Not on file  Social History Narrative  . Not on file   Social Determinants of Health   Financial Resource Strain:   . Difficulty of Paying Living Expenses:   Food Insecurity:   . Worried About Charity fundraiser in the Last Year:   . Arboriculturist in the Last Year:   Transportation Needs:   . Film/video editor (Medical):   Marland Kitchen Lack of Transportation (Non-Medical):   Physical Activity:   . Days of Exercise per Week:   . Minutes of Exercise per Session:   Stress:   . Feeling of Stress :   Social Connections:   . Frequency of Communication with Friends and Family:   . Frequency of Social Gatherings with Friends and Family:   . Attends Religious Services:   . Active Member of Clubs or Organizations:   . Attends Archivist  Meetings:   Marland Kitchen Marital Status:   Intimate Partner Violence:   . Fear of Current or Ex-Partner:   . Emotionally Abused:   Marland Kitchen Physically Abused:   . Sexually Abused:     Past Surgical History:  Procedure Laterality Date  . galbladder    . HIP ARTHROPLASTY Right 12/28/2019   Procedure: ARTHROPLASTY BIPOLAR HIP (HEMIARTHROPLASTY);  Surgeon: Thornton Park, MD;  Location: [facility] ORS;  Service: Orthopedics;  Laterality: Right;    Family History  Problem Relation Age of Onset  . Cancer Mother   . CAD Father     Allergies  Allergen Reactions  . Sulfa Antibiotics Nausea And Vomiting       Assessment & Plan:   1. Lymphedema  No surgery or intervention at this point in time.    I have reviewed my discussion with the patient regarding lymphedema and why it  causes symptoms.  Patient will continue wearing graduated compression stockings class 1 (20-30 mmHg) on a daily basis a prescription was given. The patient is reminded to put the stockings on first thing in the morning and removing them in the evening. The patient is instructed specifically not to sleep in the stockings.   In addition, behavioral modification throughout the day will be continued.  This will include frequent elevation (such as in a recliner), use of over the counter pain medications as needed and exercise such as walking.  I have reviewed systemic causes for chronic edema such as liver, kidney and cardiac etiologies and there does not appear to be any significant changes in these organ systems over the past year.  The patient is under the impression that these organ systems are all stable and unchanged.    The patient will continue aggressive use of the  lymph pump.  This will continue to improve the edema control and prevent sequela such as ulcers and infections.   To the patient's most recent episode of cellulitis in blistering, we will have the patient follow-up in 3 months with no studies.   2. Essential  hypertension Continue antihypertensive medications as already ordered, these medications have been reviewed and there are no changes at this time.   3. Hyperlipidemia, unspecified hyperlipidemia type Continue statin as ordered and reviewed, no changes at this time    Current Outpatient Medications on File Prior to Visit  Medication Sig Dispense Refill  . amLODipine-benazepril (LOTREL) 10-40 MG capsule Take 1 capsule by mouth daily.     Marland Kitchen  aspirin EC 81 MG tablet Take 81 mg by mouth at bedtime.     Marland Kitchen atorvastatin (LIPITOR) 40 MG tablet Take 40 mg by mouth at bedtime.     . bisoprolol-hydrochlorothiazide (ZIAC) 10-6.25 MG tablet Take 1 tablet by mouth daily.    . Cholecalciferol 25 MCG (1000 UT) capsule Take 1,000 Units by mouth daily.     . clopidogrel (PLAVIX) 75 MG tablet Take 75 mg by mouth daily.    . Coenzyme Q10 10 MG capsule Take 10 mg by mouth daily.     . fluticasone (FLONASE) 50 MCG/ACT nasal spray Place 1 spray into both nostrils daily.    . hydrochlorothiazide (MICROZIDE) 12.5 MG capsule Take 12.5 mg by mouth daily.    Marland Kitchen levothyroxine (SYNTHROID) 75 MCG tablet Take 75 mcg by mouth daily before breakfast.     . linagliptin (TRADJENTA) 5 MG TABS tablet Take 5 mg by mouth daily.     Marland Kitchen loratadine (CLARITIN) 10 MG tablet Take 10 mg by mouth daily.    . Multiple Vitamin (MULTIVITAMIN WITH MINERALS) TABS tablet Take 1 tablet by mouth daily.    . Omega-3 Fatty Acids (FISH OIL TRIPLE STRENGTH) 1400 MG CAPS Take 1 capsule by mouth daily.    . tamsulosin (FLOMAX) 0.4 MG CAPS capsule Take 0.4 mg by mouth daily.     . vitamin B-12 (CYANOCOBALAMIN) 500 MCG tablet Take 500 mcg by mouth daily.    . cephALEXin (KEFLEX) 500 MG capsule Take 1 capsule (500 mg total) by mouth 4 (four) times daily. (Patient not taking: Reported on 06/24/2020) 28 capsule 0   No current facility-administered medications on file prior to visit.    There are no Patient Instructions on file for this visit. No  follow-ups on file.   Kris Hartmann, NP

## 2020-06-25 ENCOUNTER — Telehealth (INDEPENDENT_AMBULATORY_CARE_PROVIDER_SITE_OTHER): Payer: Self-pay | Admitting: Nurse Practitioner

## 2020-06-25 NOTE — Telephone Encounter (Signed)
Called stating that he as advised to call if any ulcers appeared on his legs, and he called to make [place] aware that they did. Patient was last seen yesterday 06-24-20 with an unna boot check (FB). Please advise.

## 2020-06-26 ENCOUNTER — Ambulatory Visit (INDEPENDENT_AMBULATORY_CARE_PROVIDER_SITE_OTHER): Payer: Medicare Other | Admitting: Nurse Practitioner

## 2020-06-26 ENCOUNTER — Encounter (INDEPENDENT_AMBULATORY_CARE_PROVIDER_SITE_OTHER): Payer: Self-pay | Admitting: Nurse Practitioner

## 2020-06-26 ENCOUNTER — Other Ambulatory Visit: Payer: Self-pay

## 2020-06-26 VITALS — BP 159/78 | HR 112 | Ht 74.0 in | Wt 208.0 lb

## 2020-06-26 DIAGNOSIS — L97909 Non-pressure chronic ulcer of unspecified part of unspecified lower leg with unspecified severity: Secondary | ICD-10-CM | POA: Diagnosis not present

## 2020-06-26 DIAGNOSIS — I83009 Varicose veins of unspecified lower extremity with ulcer of unspecified site: Secondary | ICD-10-CM | POA: Diagnosis not present

## 2020-06-26 NOTE — Telephone Encounter (Signed)
Patient can be schedule to come in for unna wraps

## 2020-06-26 NOTE — Progress Notes (Signed)
History of Present Illness  There is no documented history at this time  Assessments & Plan   There are no diagnoses linked to this encounter.    Additional instructions  Subjective:  Patient presents with venous ulcer of the Bilateral lower extremity.    Procedure:  3 layer unna wrap was placed Bilateral lower extremity.   Plan:   Follow up in one week.  

## 2020-07-03 ENCOUNTER — Encounter (INDEPENDENT_AMBULATORY_CARE_PROVIDER_SITE_OTHER): Payer: Self-pay

## 2020-07-03 ENCOUNTER — Ambulatory Visit (INDEPENDENT_AMBULATORY_CARE_PROVIDER_SITE_OTHER): Payer: Medicare Other | Admitting: Nurse Practitioner

## 2020-07-03 ENCOUNTER — Other Ambulatory Visit: Payer: Self-pay

## 2020-07-03 ENCOUNTER — Encounter (INDEPENDENT_AMBULATORY_CARE_PROVIDER_SITE_OTHER): Payer: Medicare Other

## 2020-07-03 VITALS — BP 140/60 | HR 75 | Resp 18

## 2020-07-03 DIAGNOSIS — I83009 Varicose veins of unspecified lower extremity with ulcer of unspecified site: Secondary | ICD-10-CM

## 2020-07-03 DIAGNOSIS — L97909 Non-pressure chronic ulcer of unspecified part of unspecified lower leg with unspecified severity: Secondary | ICD-10-CM

## 2020-07-03 NOTE — Progress Notes (Signed)
History of Present Illness  There is no documented history at this time  Assessments & Plan   There are no diagnoses linked to this encounter.    Additional instructions  Subjective:  Patient presents with venous ulcer of the Bilateral lower extremity.    Procedure:  3 layer unna wrap was placed Bilateral lower extremity.   Plan:   Follow up in one week.  

## 2020-07-10 ENCOUNTER — Other Ambulatory Visit: Payer: Self-pay

## 2020-07-10 ENCOUNTER — Encounter (INDEPENDENT_AMBULATORY_CARE_PROVIDER_SITE_OTHER): Payer: Self-pay | Admitting: Nurse Practitioner

## 2020-07-10 ENCOUNTER — Ambulatory Visit (INDEPENDENT_AMBULATORY_CARE_PROVIDER_SITE_OTHER): Payer: Medicare Other | Admitting: Nurse Practitioner

## 2020-07-10 VITALS — BP 186/82 | HR 116 | Resp 18 | Ht 74.0 in | Wt 209.0 lb

## 2020-07-10 DIAGNOSIS — L97909 Non-pressure chronic ulcer of unspecified part of unspecified lower leg with unspecified severity: Secondary | ICD-10-CM

## 2020-07-10 DIAGNOSIS — I83009 Varicose veins of unspecified lower extremity with ulcer of unspecified site: Secondary | ICD-10-CM

## 2020-07-10 NOTE — Progress Notes (Signed)
History of Present Illness  There is no documented history at this time  Assessments & Plan   There are no diagnoses linked to this encounter.    Additional instructions  Subjective:  Patient presents with venous ulcer of the Bilateral lower extremity.    Procedure:  3 layer unna wrap was placed Bilateral lower extremity.   Plan:   Follow up in one week.  

## 2020-07-17 ENCOUNTER — Encounter (INDEPENDENT_AMBULATORY_CARE_PROVIDER_SITE_OTHER): Payer: Self-pay | Admitting: Nurse Practitioner

## 2020-07-17 ENCOUNTER — Other Ambulatory Visit: Payer: Self-pay

## 2020-07-17 ENCOUNTER — Ambulatory Visit (INDEPENDENT_AMBULATORY_CARE_PROVIDER_SITE_OTHER): Payer: Medicare Other | Admitting: Nurse Practitioner

## 2020-07-17 VITALS — BP 166/67 | HR 114 | Resp 18 | Ht 74.0 in | Wt 212.0 lb

## 2020-07-17 DIAGNOSIS — I10 Essential (primary) hypertension: Secondary | ICD-10-CM | POA: Diagnosis not present

## 2020-07-17 DIAGNOSIS — E119 Type 2 diabetes mellitus without complications: Secondary | ICD-10-CM

## 2020-07-17 DIAGNOSIS — I890 Lymphedema, not elsewhere classified: Secondary | ICD-10-CM

## 2020-07-21 ENCOUNTER — Telehealth (INDEPENDENT_AMBULATORY_CARE_PROVIDER_SITE_OTHER): Payer: Self-pay | Admitting: Nurse Practitioner

## 2020-07-21 ENCOUNTER — Encounter (INDEPENDENT_AMBULATORY_CARE_PROVIDER_SITE_OTHER): Payer: Self-pay | Admitting: Nurse Practitioner

## 2020-07-21 NOTE — Progress Notes (Signed)
Subjective:    Patient ID: Grant Mcdaniel, male    DOB: 1941-04-07, 79 y.o.   MRN: [id number] Chief Complaint  Patient presents with  . Follow-up    unna boot check    Patient presents today for evaluation of bilateral lower extremity edema.  The patient was recently seen several weeks ago and removed from [place] wraps due to great control of his swelling.  However about 2 days later the patient began to have blisters and worsening edema had to be placed back into the Unna wraps.  Today the patient does not have significant edema or blisters on his bilateral lower extremities, however he does have extensive swelling extending above the Unna wraps up to his thighs.  Patient denies worsening shortness of breath but he continues to have blisters as long as there is no compression.  The patient was supposed to have follow-up with his cardiologist however he is uncertain what happened with the timing of the follow-up.  He denies any chest pain shortness of breath.   Review of Systems  Cardiovascular: Positive for leg swelling.  Skin: Positive for wound.  Neurological: Positive for weakness.       Objective:   Physical Exam Vitals reviewed.  HENT:     Head: Normocephalic.  Cardiovascular:     Rate and Rhythm: Normal rate and regular rhythm.     Pulses: Normal pulses.  Pulmonary:     Effort: Pulmonary effort is normal.     Breath sounds: Normal breath sounds.  Musculoskeletal:     Right lower leg: 2+ Pitting Edema (Extending above knee bilaterally) present.     Left lower leg: 2+ Pitting Edema present.  Skin:    Capillary Refill: Capillary refill takes less than 2 seconds.  Neurological:     Mental Status: He is alert and oriented to person, place, and time.     Motor: Weakness present.     Gait: Gait abnormal.  Psychiatric:        Mood and Affect: Mood normal.        Behavior: Behavior normal.        Thought Content: Thought content normal.        Judgment: Judgment normal.      BP (!) 166/67 (BP Location: Right Arm)   Pulse (!) 114   Resp 18   Ht 6\' 2"  (1.88 m)   Wt (!) 212 lb (96.2 kg)   BMI 27.22 kg/m   Past Medical History:  Diagnosis Date  . CAD (coronary artery disease)   . 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 Diabetes mellitus without complication ([place])   . Hypercholesterolemia   . Hypertension   . Hypertension   . Hypothyroid   . PVD (peripheral vascular disease) ([place])   . Venous insufficiency     Social History   Socioeconomic History  . Marital status: Married    Spouse name: Not on file  . Number of children: Not on file  . Years of education: Not on file  . Highest education level: Not on file  Occupational History  . Not on file  Tobacco Use  . Smoking status: Never Smoker  . Smokeless tobacco: Never Used  Substance and Sexual Activity  . Alcohol use: Never  . Drug use: Never  . Sexual activity: Not on file  Other Topics Concern  . Not on file  Social History Narrative  . Not on file   Social Determinants of  Health   Financial Resource Strain:   . Difficulty of Paying Living Expenses:   Food Insecurity:   . Worried About Charity fundraiser in the Last Year:   . Arboriculturist in the Last Year:   Transportation Needs:   . Film/video editor (Medical):   Marland Kitchen Lack of Transportation (Non-Medical):   Physical Activity:   . Days of Exercise per Week:   . Minutes of Exercise per Session:   Stress:   . Feeling of Stress :   Social Connections:   . Frequency of Communication with Friends and Family:   . Frequency of Social Gatherings with Friends and Family:   . Attends Religious Services:   . Active Member of Clubs or Organizations:   . Attends Archivist Meetings:   Marland Kitchen Marital Status:   Intimate Partner Violence:   . Fear of Current or Ex-Partner:   . Emotionally Abused:   Marland Kitchen Physically Abused:   . Sexually Abused:     Past Surgical History:  Procedure Laterality Date  .  galbladder    . HIP ARTHROPLASTY Right 12/28/2019   Procedure: ARTHROPLASTY BIPOLAR HIP (HEMIARTHROPLASTY);  Surgeon: Thornton Park, MD;  Location: [facility] ORS;  Service: Orthopedics;  Laterality: Right;    Family History  Problem Relation Age of Onset  . Cancer Mother   . CAD Father     Allergies  Allergen Reactions  . Sulfa Antibiotics Nausea And Vomiting       Assessment & Plan:   1. Lymphedema No surgery or intervention at this point in time.    I have had a long discussion with the patient regarding venous insufficiency and why it  causes symptoms, specifically venous ulceration . I have discussed with the patient the chronic skin changes that accompany venous insufficiency and the long term sequela such as infection and recurring  ulceration.  Patient will be placed in [employer] which will be changed weekly drainage permitting.  In addition, behavioral modification including several periods of elevation of the lower extremities during the day will be continued. Achieving a position with the ankles at heart level was stressed to the patient  We will have patient return to the office in 4 weeks for evaluation of progression of lower extremity edema..  We will also try to coordinate a follow-up with the patient's cardiologist for possible diuresis.  The patient has extensive edema which is likely complicating his lymphedema.   2. Type 2 diabetes mellitus without complication, without long-term current use of insulin (HCC) Continue hypoglycemic medications as already ordered, these medications have been reviewed and there are no changes at this time.  Hgb A1C to be monitored as already arranged by primary service   3. Essential hypertension Continue antihypertensive medications as already ordered, these medications have been reviewed and there are no changes at this time.    Current Outpatient Medications on File Prior to Visit  Medication Sig Dispense Refill  .  amLODipine-benazepril (LOTREL) 10-40 MG capsule Take 1 capsule by mouth daily.     Marland Kitchen aspirin EC 81 MG tablet Take 81 mg by mouth at bedtime.     Marland Kitchen atorvastatin (LIPITOR) 40 MG tablet Take 40 mg by mouth at bedtime.     . bisoprolol-hydrochlorothiazide (ZIAC) 10-6.25 MG tablet Take 1 tablet by mouth daily.    . cephALEXin (KEFLEX) 500 MG capsule Take 1 capsule (500 mg total) by mouth 4 (four) times daily. 28 capsule 0  . Cholecalciferol 25  MCG (1000 UT) capsule Take 1,000 Units by mouth daily.     . clopidogrel (PLAVIX) 75 MG tablet Take 75 mg by mouth daily.    . Coenzyme Q10 10 MG capsule Take 10 mg by mouth daily.     . fluticasone (FLONASE) 50 MCG/ACT nasal spray Place 1 spray into both nostrils daily.    . hydrochlorothiazide (MICROZIDE) 12.5 MG capsule Take 12.5 mg by mouth daily.    Marland Kitchen levothyroxine (SYNTHROID) 75 MCG tablet Take 75 mcg by mouth daily before breakfast.     . linagliptin (TRADJENTA) 5 MG TABS tablet Take 5 mg by mouth daily.     Marland Kitchen loratadine (CLARITIN) 10 MG tablet Take 10 mg by mouth daily.    . Multiple Vitamin (MULTIVITAMIN WITH MINERALS) TABS tablet Take 1 tablet by mouth daily.    . Omega-3 Fatty Acids (FISH OIL TRIPLE STRENGTH) 1400 MG CAPS Take 1 capsule by mouth daily.    . tamsulosin (FLOMAX) 0.4 MG CAPS capsule Take 0.4 mg by mouth daily.     . vitamin B-12 (CYANOCOBALAMIN) 500 MCG tablet Take 500 mcg by mouth daily.     No current facility-administered medications on file prior to visit.    There are no Patient Instructions on file for this visit. No follow-ups on file.   Kris Hartmann, NP

## 2020-07-21 NOTE — Telephone Encounter (Signed)
Call Dr Garen Lah office and the patient will need a referral sent.

## 2020-07-22 ENCOUNTER — Other Ambulatory Visit (INDEPENDENT_AMBULATORY_CARE_PROVIDER_SITE_OTHER): Payer: Self-pay | Admitting: Nurse Practitioner

## 2020-07-22 DIAGNOSIS — M7989 Other specified soft tissue disorders: Secondary | ICD-10-CM

## 2020-07-24 ENCOUNTER — Ambulatory Visit (INDEPENDENT_AMBULATORY_CARE_PROVIDER_SITE_OTHER): Payer: Medicare Other | Admitting: Nurse Practitioner

## 2020-07-24 ENCOUNTER — Encounter (INDEPENDENT_AMBULATORY_CARE_PROVIDER_SITE_OTHER): Payer: Self-pay

## 2020-07-24 ENCOUNTER — Other Ambulatory Visit: Payer: Self-pay

## 2020-07-24 VITALS — BP 155/65 | HR 114 | Resp 16 | Wt 212.6 lb

## 2020-07-24 DIAGNOSIS — I890 Lymphedema, not elsewhere classified: Secondary | ICD-10-CM | POA: Diagnosis not present

## 2020-07-24 NOTE — Progress Notes (Signed)
History of Present Illness  There is no documented history at this time  Assessments & Plan   There are no diagnoses linked to this encounter.    Additional instructions  Subjective:  Patient presents with venous ulcer of the Bilateral lower extremity.    Procedure:  3 layer unna wrap was placed Bilateral lower extremity.   Plan:   Follow up in one week.  

## 2020-07-28 ENCOUNTER — Encounter (INDEPENDENT_AMBULATORY_CARE_PROVIDER_SITE_OTHER): Payer: Self-pay | Admitting: Nurse Practitioner

## 2020-07-31 ENCOUNTER — Ambulatory Visit (INDEPENDENT_AMBULATORY_CARE_PROVIDER_SITE_OTHER): Payer: Medicare Other | Admitting: Nurse Practitioner

## 2020-07-31 ENCOUNTER — Encounter (INDEPENDENT_AMBULATORY_CARE_PROVIDER_SITE_OTHER): Payer: Self-pay

## 2020-07-31 ENCOUNTER — Other Ambulatory Visit: Payer: Self-pay

## 2020-07-31 VITALS — BP 152/66 | HR 112 | Resp 16 | Wt 214.0 lb

## 2020-07-31 DIAGNOSIS — I890 Lymphedema, not elsewhere classified: Secondary | ICD-10-CM | POA: Diagnosis not present

## 2020-07-31 NOTE — Progress Notes (Signed)
History of Present Illness  There is no documented history at this time  Assessments & Plan   There are no diagnoses linked to this encounter.    Additional instructions  Subjective:  Patient presents with venous ulcer of the Bilateral lower extremity.    Procedure:  3 layer unna wrap was placed Bilateral lower extremity.   Plan:   Follow up in one week.  

## 2020-08-07 ENCOUNTER — Other Ambulatory Visit: Payer: Self-pay

## 2020-08-07 ENCOUNTER — Ambulatory Visit (INDEPENDENT_AMBULATORY_CARE_PROVIDER_SITE_OTHER): Payer: Medicare Other | Admitting: Nurse Practitioner

## 2020-08-07 VITALS — BP 153/79 | HR 114 | Ht 74.0 in | Wt 214.0 lb

## 2020-08-07 DIAGNOSIS — I890 Lymphedema, not elsewhere classified: Secondary | ICD-10-CM | POA: Diagnosis not present

## 2020-08-07 NOTE — Progress Notes (Signed)
History of Present Illness  There is no documented history at this time  Assessments & Plan   There are no diagnoses linked to this encounter.    Additional instructions  Subjective:  Patient presents with venous ulcer of the Bilateral lower extremity.    Procedure:  3 layer unna wrap was placed Bilateral lower extremity.   Plan:   Follow up in one week.  

## 2020-08-08 ENCOUNTER — Encounter (INDEPENDENT_AMBULATORY_CARE_PROVIDER_SITE_OTHER): Payer: Self-pay | Admitting: Nurse Practitioner

## 2020-08-14 ENCOUNTER — Ambulatory Visit (INDEPENDENT_AMBULATORY_CARE_PROVIDER_SITE_OTHER): Payer: Medicare Other | Admitting: Nurse Practitioner

## 2020-08-14 ENCOUNTER — Other Ambulatory Visit: Payer: Self-pay

## 2020-08-14 ENCOUNTER — Encounter (INDEPENDENT_AMBULATORY_CARE_PROVIDER_SITE_OTHER): Payer: Self-pay | Admitting: Nurse Practitioner

## 2020-08-14 VITALS — BP 148/68 | HR 98 | Resp 16 | Wt 216.0 lb

## 2020-08-14 DIAGNOSIS — I890 Lymphedema, not elsewhere classified: Secondary | ICD-10-CM

## 2020-08-14 DIAGNOSIS — N183 Chronic kidney disease, stage 3 unspecified: Secondary | ICD-10-CM

## 2020-08-14 DIAGNOSIS — E1122 Type 2 diabetes mellitus with diabetic chronic kidney disease: Secondary | ICD-10-CM | POA: Diagnosis not present

## 2020-08-14 DIAGNOSIS — I10 Essential (primary) hypertension: Secondary | ICD-10-CM

## 2020-08-14 NOTE — Progress Notes (Signed)
Subjective:    Patient ID: Grant Mcdaniel, male    DOB: 22-Sep-1941, 79 y.o.   MRN: [id number] Chief Complaint  Patient presents with  . Follow-up    unna check    Patient presents today for evaluation of bilateral lower extremity edema.  The patient has been in wraps for the last several weeks.  Previously the patient was removed from [place] wraps however within 2 days the patient began to have blistering and worsening edema and needed to have his Unna wraps replaced.  Today, the swelling is fairly well controlled however there is extensive swelling above the level of the Unna wraps, extending from his knee to his thighs.  The patient denies any chest pain or shortness of breath however as soon as there is no compression he begins to swell and have blisters.  Previous echocardiogram in early 2021 did not show evidence of heart failure or obvious abnormalities.  However, there was concern the patient may have had episodes of atrial fibrillation.  The patient was supposed to follow-up with cardiologist in March however he was unable to at that time.  He also has a previous history of stage III chronic kidney disease.  He denies any fever, chills, nausea, vomiting or diarrhea.  He is tolerating as well.     Review of Systems  Cardiovascular: Positive for leg swelling.  Neurological: Positive for weakness.       Objective:   Physical Exam Vitals reviewed.  HENT:     Head: Normocephalic.  Cardiovascular:     Rate and Rhythm: Normal rate and regular rhythm.     Pulses: Normal pulses.     Heart sounds: Normal heart sounds.  Pulmonary:     Effort: Pulmonary effort is normal.     Breath sounds: Normal breath sounds.  Musculoskeletal:     Right lower leg: Edema present.     Left lower leg: Edema present.  Skin:    General: Skin is warm and dry.     Findings: Erythema present.  Neurological:     Mental Status: He is alert and oriented to person, place, and time.     Motor: Weakness  present.  Psychiatric:        Mood and Affect: Mood normal.        Behavior: Behavior normal.        Thought Content: Thought content normal.        Judgment: Judgment normal.     Resp 16   Wt 216 lb (98 kg)   BMI 27.73 kg/m   Past Medical History:  Diagnosis Date  . CAD (coronary artery disease)   . 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 Diabetes mellitus without complication ([place])   . Hypercholesterolemia   . Hypertension   . Hypertension   . Hypothyroid   . PVD (peripheral vascular disease) ([place])   . Venous insufficiency     Social History   Socioeconomic History  . Marital status: Married    Spouse name: Not on file  . Number of children: Not on file  . Years of education: Not on file  . Highest education level: Not on file  Occupational History  . Not on file  Tobacco Use  . Smoking status: Never Smoker  . Smokeless tobacco: Never Used  Substance and Sexual Activity  . Alcohol use: Never  . Drug use: Never  . Sexual activity: Not on file  Other Topics Concern  .  Not on file  Social History Narrative  . Not on file   Social Determinants of Health   Financial Resource Strain:   . Difficulty of Paying Living Expenses: Not on file  Food Insecurity:   . Worried About Charity fundraiser in the Last Year: Not on file  . Ran Out of Food in the Last Year: Not on file  Transportation Needs:   . Lack of Transportation (Medical): Not on file  . Lack of Transportation (Non-Medical): Not on file  Physical Activity:   . Days of Exercise per Week: Not on file  . Minutes of Exercise per Session: Not on file  Stress:   . Feeling of Stress : Not on file  Social Connections:   . Frequency of Communication with Friends and Family: Not on file  . Frequency of Social Gatherings with Friends and Family: Not on file  . Attends Religious Services: Not on file  . Active Member of Clubs or Organizations: Not on file  . Attends Archivist  Meetings: Not on file  . Marital Status: Not on file  Intimate Partner Violence:   . Fear of Current or Ex-Partner: Not on file  . Emotionally Abused: Not on file  . Physically Abused: Not on file  . Sexually Abused: Not on file    Past Surgical History:  Procedure Laterality Date  . galbladder    . HIP ARTHROPLASTY Right 12/28/2019   Procedure: ARTHROPLASTY BIPOLAR HIP (HEMIARTHROPLASTY);  Surgeon: Thornton Park, MD;  Location: [facility] ORS;  Service: Orthopedics;  Laterality: Right;    Family History  Problem Relation Age of Onset  . Cancer Mother   . CAD Father     Allergies  Allergen Reactions  . Sulfa Antibiotics Nausea And Vomiting       Assessment & Plan:   1. Lymphedema We will place the patient back in wraps today.  The patient has an upcoming appointment with cardiology for evaluation.  Based upon the patient's progression of swelling, diuretics may be helpful but due to the patient's numerous hypertension issues in addition to chronic kidney disease, it was felt that it would be best to allow cardiology to make adjustments.  The patient will continue to come to the office on a weekly basis to have his Unna wraps changed.  We will reevaluate the patient's lower extremity edema in 4 weeks.  The patient is also advised to continue usage of his lymphedema pump.  2. Essential hypertension Continue antihypertensive medications as already ordered, these medications have been reviewed and there are no changes at this time.   3. CKD stage 3 due to type 2 diabetes mellitus ([place]) This also can be contributing to the patient's worsening lower extremity edema.   Current Outpatient Medications on File Prior to Visit  Medication Sig Dispense Refill  . amLODipine-benazepril (LOTREL) 10-40 MG capsule Take 1 capsule by mouth daily.     Marland Kitchen aspirin EC 81 MG tablet Take 81 mg by mouth at bedtime.     Marland Kitchen atorvastatin (LIPITOR) 40 MG tablet Take 40 mg by mouth at bedtime.     .  bisoprolol-hydrochlorothiazide (ZIAC) 10-6.25 MG tablet Take 1 tablet by mouth daily.    . Cholecalciferol 25 MCG (1000 UT) capsule Take 1,000 Units by mouth daily.     . clopidogrel (PLAVIX) 75 MG tablet Take 75 mg by mouth daily.    . Coenzyme Q10 10 MG capsule Take 10 mg by mouth daily.     Marland Kitchen  fluticasone (FLONASE) 50 MCG/ACT nasal spray Place 1 spray into both nostrils daily.    . hydrochlorothiazide (MICROZIDE) 12.5 MG capsule Take 12.5 mg by mouth daily.    Marland Kitchen levothyroxine (SYNTHROID) 75 MCG tablet Take 75 mcg by mouth daily before breakfast.     . linagliptin (TRADJENTA) 5 MG TABS tablet Take 5 mg by mouth daily.     Marland Kitchen loratadine (CLARITIN) 10 MG tablet Take 10 mg by mouth daily.    . Multiple Vitamin (MULTIVITAMIN WITH MINERALS) TABS tablet Take 1 tablet by mouth daily.    . Omega-3 Fatty Acids (FISH OIL TRIPLE STRENGTH) 1400 MG CAPS Take 1 capsule by mouth daily.    . tamsulosin (FLOMAX) 0.4 MG CAPS capsule Take 0.4 mg by mouth daily.     . vitamin B-12 (CYANOCOBALAMIN) 500 MCG tablet Take 500 mcg by mouth daily.    . cephALEXin (KEFLEX) 500 MG capsule Take 1 capsule (500 mg total) by mouth 4 (four) times daily. (Patient not taking: Reported on 08/14/2020) 28 capsule 0   No current facility-administered medications on file prior to visit.    There are no Patient Instructions on file for this visit. No follow-ups on file.   Kris Hartmann, NP

## 2020-08-15 ENCOUNTER — Encounter (INDEPENDENT_AMBULATORY_CARE_PROVIDER_SITE_OTHER): Payer: Self-pay | Admitting: Nurse Practitioner

## 2020-08-21 ENCOUNTER — Other Ambulatory Visit: Payer: Self-pay

## 2020-08-21 ENCOUNTER — Ambulatory Visit (INDEPENDENT_AMBULATORY_CARE_PROVIDER_SITE_OTHER): Payer: Medicare Other | Admitting: Cardiology

## 2020-08-21 ENCOUNTER — Encounter: Payer: Self-pay | Admitting: Cardiology

## 2020-08-21 ENCOUNTER — Encounter (INDEPENDENT_AMBULATORY_CARE_PROVIDER_SITE_OTHER): Payer: Self-pay

## 2020-08-21 ENCOUNTER — Ambulatory Visit (INDEPENDENT_AMBULATORY_CARE_PROVIDER_SITE_OTHER): Payer: Medicare Other | Admitting: Nurse Practitioner

## 2020-08-21 VITALS — BP 115/69 | HR 90 | Resp 16 | Wt 216.0 lb

## 2020-08-21 VITALS — BP 156/84 | HR 110 | Ht 74.0 in | Wt 214.6 lb

## 2020-08-21 DIAGNOSIS — I2510 Atherosclerotic heart disease of native coronary artery without angina pectoris: Secondary | ICD-10-CM | POA: Diagnosis not present

## 2020-08-21 DIAGNOSIS — I890 Lymphedema, not elsewhere classified: Secondary | ICD-10-CM | POA: Diagnosis not present

## 2020-08-21 DIAGNOSIS — I10 Essential (primary) hypertension: Secondary | ICD-10-CM | POA: Diagnosis not present

## 2020-08-21 DIAGNOSIS — R600 Localized edema: Secondary | ICD-10-CM | POA: Diagnosis not present

## 2020-08-21 MED ORDER — FUROSEMIDE 40 MG PO TABS: 40.0000 mg | ORAL_TABLET | Freq: Every day | ORAL | 5 refills | Status: DC

## 2020-08-21 NOTE — Progress Notes (Signed)
History of Present Illness  There is no documented history at this time  Assessments & Plan   There are no diagnoses linked to this encounter.    Additional instructions  Subjective:  Patient presents with venous ulcer of the Bilateral lower extremity.    Procedure:  3 layer unna wrap was placed Bilateral lower extremity.   Plan:   Follow up in one week.  

## 2020-08-21 NOTE — Patient Instructions (Signed)
Medication Instructions:  Your physician has recommended you make the following change in your medication:   1.  STOP taking your . 2.  START taking furosemide (LASIX) 40 MG tablet: Take 1 tablet (40 mg total) by mouth daily.  *If you need a refill on your cardiac medications before your next appointment, please call your pharmacy*   Lab Work: None Ordered If you have labs (blood work) drawn today and your tests are completely normal, you will receive your results only by: Marland Kitchen MyChart Message (if you have MyChart) OR . A paper copy in the mail If you have any lab test that is abnormal or we need to change your treatment, we will call you to review the results.   Testing/Procedures: None Ordered   Follow-Up: At [facility], you and your health needs are our priority.  As part of our continuing mission to provide you with exceptional heart care, we have created designated Provider Care Teams.  These Care Teams include your primary Cardiologist (physician) and Advanced Practice Providers (APPs -  Physician Assistants and Nurse Practitioners) who all work together to provide you with the care you need, when you need it.  We recommend signing up for the patient portal called "MyChart".  Sign up information is provided on this After Visit Summary.  MyChart is used to connect with patients for Virtual Visits (Telemedicine).  Patients are able to view lab/test results, encounter notes, upcoming appointments, etc.  Non-urgent messages can be sent to your provider as well.   To learn more about what you can do with MyChart, go to [email or web address].    Your next appointment:   6 week(s)  The format for your next appointment:   In Person  Provider:   Kate Sable, MD   Other Instructions

## 2020-08-21 NOTE — Progress Notes (Signed)
Cardiology Office Note:    Date:  08/21/2020   ID:  Grant Mcdaniel, DOB 1941/02/07, MRN [id number]  PCP:  Marguerita Merles, MD  [facility] Cardiologist:  Kate Sable, MD  [place] Electrophysiologist:  None   Referring MD: Kris Hartmann, NP   Chief Complaint  Patient presents with  . Other    referred by Vasc Surgery     History of Present Illness:    Grant Mcdaniel is a 79 y.o. male with a hx of CAD (PCI/DES to [place] in 2004 at [facility]) hypertension, CKD, diabetes, lymphedema, who presents due to edema.  Patient being followed the vein clinic due to lower extremity edema.  Unna boots, wraps have been used to help with patient's symptoms.  This has not been very successful.  Worsening edema around the knees when noted.  There is need for diuretic therapy/Lasix to help with patient's edema.  He denies any chest pain or shortness of breath since his stent placement in 2004.  Compliant aspirin, Plavix, statin.  Patient also has stage III CKD, has appointment with nephrologist in about 2 weeks.  Echocardiogram on 12/2019 showed normal systolic and diastolic function, EF 55 to 60%.  Past Medical History:  Diagnosis Date  . CAD (coronary artery disease)   . 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 Diabetes mellitus without complication ([place])   . Hypercholesterolemia   . Hypertension   . Hypertension   . Hypothyroid   . PVD (peripheral vascular disease) ([place])   . Venous insufficiency     Past Surgical History:  Procedure Laterality Date  . galbladder    . HIP ARTHROPLASTY Right 12/28/2019   Procedure: ARTHROPLASTY BIPOLAR HIP (HEMIARTHROPLASTY);  Surgeon: Thornton Park, MD;  Location: [facility] ORS;  Service: Orthopedics;  Laterality: Right;    Current Medications: Current Meds  Medication Sig  . amLODipine-benazepril (LOTREL) 10-40 MG capsule Take 1 capsule by mouth daily.   Marland Kitchen aspirin EC 81 MG tablet Take 81 mg by mouth at bedtime.   Marland Kitchen atorvastatin  (LIPITOR) 40 MG tablet Take 40 mg by mouth at bedtime.   . bisoprolol-hydrochlorothiazide (ZIAC) 10-6.25 MG tablet Take 1 tablet by mouth daily.  . Cholecalciferol 25 MCG (1000 UT) capsule Take 1,000 Units by mouth daily.   . clopidogrel (PLAVIX) 75 MG tablet Take 75 mg by mouth daily.  . Coenzyme Q10 10 MG capsule Take 10 mg by mouth daily.   . fluticasone (FLONASE) 50 MCG/ACT nasal spray Place 1 spray into both nostrils daily.  Marland Kitchen levothyroxine (SYNTHROID) 75 MCG tablet Take 75 mcg by mouth daily before breakfast.   . linagliptin (TRADJENTA) 5 MG TABS tablet Take 5 mg by mouth daily.   Marland Kitchen loratadine (CLARITIN) 10 MG tablet Take 10 mg by mouth daily.  . Multiple Vitamin (MULTIVITAMIN WITH MINERALS) TABS tablet Take 1 tablet by mouth daily.  . Omega-3 Fatty Acids (FISH OIL TRIPLE STRENGTH) 1400 MG CAPS Take 1 capsule by mouth daily.  . tamsulosin (FLOMAX) 0.4 MG CAPS capsule Take 0.4 mg by mouth daily.   . vitamin B-12 (CYANOCOBALAMIN) 500 MCG tablet Take 500 mcg by mouth daily.  . [DISCONTINUED] hydrochlorothiazide (MICROZIDE) 12.5 MG capsule Take 12.5 mg by mouth daily.     Allergies:   Sulfa antibiotics   Social History   Socioeconomic History  . Marital status: Married    Spouse name: Not on file  . Number of children: Not on file  . Years  of education: Not on file  . Highest education level: Not on file  Occupational History  . Not on file  Tobacco Use  . Smoking status: Never Smoker  . Smokeless tobacco: Never Used  Substance and Sexual Activity  . Alcohol use: Never  . Drug use: Never  . Sexual activity: Not on file  Other Topics Concern  . Not on file  Social History Narrative  . Not on file   Social Determinants of Health   Financial Resource Strain:   . Difficulty of Paying Living Expenses: Not on file  Food Insecurity:   . Worried About Charity fundraiser in the Last Year: Not on file  . Ran Out of Food in the Last Year: Not on file  Transportation Needs:   .  Lack of Transportation (Medical): Not on file  . Lack of Transportation (Non-Medical): Not on file  Physical Activity:   . Days of Exercise per Week: Not on file  . Minutes of Exercise per Session: Not on file  Stress:   . Feeling of Stress : Not on file  Social Connections:   . Frequency of Communication with Friends and Family: Not on file  . Frequency of Social Gatherings with Friends and Family: Not on file  . Attends Religious Services: Not on file  . Active Member of Clubs or Organizations: Not on file  . Attends Archivist Meetings: Not on file  . Marital Status: Not on file     Family History: The patient's family history includes CAD in his father; Cancer in his mother.  ROS:   Please see the history of present illness.     All other systems reviewed and are negative.  EKGs/Labs/Other Studies Reviewed:    The following studies were reviewed today:   EKG:  EKG is  ordered today.  The ekg ordered today demonstrates sinus tachycardia  Recent Labs: 01/04/2020: ALT 36 01/05/2020: Magnesium 2.1 01/06/2020: BUN 30; Creatinine, Ser 1.21; Hemoglobin 8.6; Platelets 205; Potassium 4.0; Sodium 139  Recent Lipid Panel    Component Value Date/Time   CHOL 113 12/30/2019 0341   TRIG 129 12/30/2019 0341   HDL 30 (L) 12/30/2019 0341   CHOLHDL 3.8 12/30/2019 0341   VLDL 26 12/30/2019 0341   LDLCALC 57 12/30/2019 0341    Physical Exam:    VS:  BP (!) 156/84   Pulse (!) 110   Ht 6\' 2"  (1.88 m)   Wt 214 lb 9.6 oz (97.3 kg)   SpO2 98%   BMI 27.55 kg/m     Wt Readings from Last 3 Encounters:  08/21/20 214 lb 9.6 oz (97.3 kg)  08/14/20 216 lb (98 kg)  08/07/20 214 lb (97.1 kg)     GEN:  Well nourished, well developed in no acute distress HEENT: Normal NECK: No JVD; No carotid bruits LYMPHATICS: No lymphadenopathy CARDIAC: RRR, no murmurs, rubs, gallops RESPIRATORY:  Clear to auscultation without rales, wheezing or rhonchi  ABDOMEN: Soft, non-tender,  non-distended MUSCULOSKELETAL:  2+ edema around the knees SKIN: Warm and dry NEUROLOGIC:  Alert and oriented x 3 PSYCHIATRIC:  Normal affect   ASSESSMENT:    1. Coronary artery disease involving native coronary artery of native heart without angina pectoris   2. Edema leg   3. Essential hypertension    PLAN:    In order of problems listed above:  1. Patient with history of CAD status post PCI with drug-eluting stent to first obtuse marginal 2004.  Currently  denies symptoms of chest pain.  Continue aspirin, Plavix, Lipitor. 2. Lower extremity edema noted.  Stop HCTZ, start Lasix 40 mg daily to help with edema.  Etiology for edema multifactorial including component of lymphedema, CKD.  Last echocardiogram showed normal systolic function, diastolic function also normal.  Will like to get input from nephrology as to Lasix usage as this may worsen renal symptoms.  This was discussed in detail with patient and family.  They have upcoming appointment with nephrologist in about 2 weeks. 3. History of hypertension, continue current BP meds.  Lasix as above.  Follow-up in 6 weeks.  Total encounter time 60 minutes  Greater than 50% was spent in counseling and coordination of care with the patient   Medication Adjustments/Labs and Tests Ordered: Current medicines are reviewed at length with the patient today.  Concerns regarding medicines are outlined above.  Orders Placed This Encounter  Procedures  . EKG 12-Lead   Meds ordered this encounter  Medications  . furosemide (LASIX) 40 MG tablet    Sig: Take 1 tablet (40 mg total) by mouth daily.    Dispense:  30 tablet    Refill:  5    Patient Instructions  Medication Instructions:  Your physician has recommended you make the following change in your medication:   1.  STOP taking your . 2.  START taking furosemide (LASIX) 40 MG tablet: Take 1 tablet (40 mg total) by mouth daily.  *If you need a refill on your cardiac medications before  your next appointment, please call your pharmacy*   Lab Work: None Ordered If you have labs (blood work) drawn today and your tests are completely normal, you will receive your results only by: Marland Kitchen MyChart Message (if you have MyChart) OR . A paper copy in the mail If you have any lab test that is abnormal or we need to change your treatment, we will call you to review the results.   Testing/Procedures: None Ordered   Follow-Up: At [facility], you and your health needs are our priority.  As part of our continuing mission to provide you with exceptional heart care, we have created designated Provider Care Teams.  These Care Teams include your primary Cardiologist (physician) and Advanced Practice Providers (APPs -  Physician Assistants and Nurse Practitioners) who all work together to provide you with the care you need, when you need it.  We recommend signing up for the patient portal called "MyChart".  Sign up information is provided on this After Visit Summary.  MyChart is used to connect with patients for Virtual Visits (Telemedicine).  Patients are able to view lab/test results, encounter notes, upcoming appointments, etc.  Non-urgent messages can be sent to your provider as well.   To learn more about what you can do with MyChart, go to [email or web address].    Your next appointment:   6 week(s)  The format for your next appointment:   In Person  Provider:   Kate Sable, MD   Other Instructions      Signed, Kate Sable, MD  08/21/2020 12:45 PM    [place]

## 2020-08-25 ENCOUNTER — Encounter (INDEPENDENT_AMBULATORY_CARE_PROVIDER_SITE_OTHER): Payer: Self-pay | Admitting: Nurse Practitioner

## 2020-08-28 ENCOUNTER — Ambulatory Visit: Payer: Medicare Other | Admitting: Podiatry

## 2020-08-28 ENCOUNTER — Ambulatory Visit (INDEPENDENT_AMBULATORY_CARE_PROVIDER_SITE_OTHER): Payer: Medicare Other | Admitting: Nurse Practitioner

## 2020-08-28 ENCOUNTER — Other Ambulatory Visit: Payer: Self-pay

## 2020-08-28 VITALS — BP 155/68 | HR 109 | Ht 74.0 in | Wt 217.0 lb

## 2020-08-28 DIAGNOSIS — I890 Lymphedema, not elsewhere classified: Secondary | ICD-10-CM | POA: Diagnosis not present

## 2020-08-28 NOTE — Progress Notes (Signed)
History of Present Illness  There is no documented history at this time  Assessments & Plan   There are no diagnoses linked to this encounter.    Additional instructions  Subjective:  Patient presents with venous ulcer of the Bilateral lower extremity.    Procedure:  3 layer unna wrap was placed Bilateral lower extremity.   Plan:   Follow up in one week.  

## 2020-09-01 ENCOUNTER — Encounter (INDEPENDENT_AMBULATORY_CARE_PROVIDER_SITE_OTHER): Payer: Self-pay | Admitting: Nurse Practitioner

## 2020-09-04 ENCOUNTER — Ambulatory Visit (INDEPENDENT_AMBULATORY_CARE_PROVIDER_SITE_OTHER): Payer: Medicare Other | Admitting: Nurse Practitioner

## 2020-09-04 ENCOUNTER — Encounter (INDEPENDENT_AMBULATORY_CARE_PROVIDER_SITE_OTHER): Payer: Self-pay | Admitting: Nurse Practitioner

## 2020-09-04 ENCOUNTER — Ambulatory Visit (INDEPENDENT_AMBULATORY_CARE_PROVIDER_SITE_OTHER): Payer: Medicare Other | Admitting: Podiatry

## 2020-09-04 ENCOUNTER — Other Ambulatory Visit: Payer: Self-pay

## 2020-09-04 ENCOUNTER — Encounter: Payer: Self-pay | Admitting: Podiatry

## 2020-09-04 VITALS — BP 132/65 | HR 109 | Resp 18 | Ht 74.0 in | Wt 221.0 lb

## 2020-09-04 DIAGNOSIS — E1122 Type 2 diabetes mellitus with diabetic chronic kidney disease: Secondary | ICD-10-CM | POA: Diagnosis not present

## 2020-09-04 DIAGNOSIS — I10 Essential (primary) hypertension: Secondary | ICD-10-CM

## 2020-09-04 DIAGNOSIS — D689 Coagulation defect, unspecified: Secondary | ICD-10-CM

## 2020-09-04 DIAGNOSIS — I890 Lymphedema, not elsewhere classified: Secondary | ICD-10-CM

## 2020-09-04 DIAGNOSIS — E119 Type 2 diabetes mellitus without complications: Secondary | ICD-10-CM | POA: Diagnosis not present

## 2020-09-04 DIAGNOSIS — I739 Peripheral vascular disease, unspecified: Secondary | ICD-10-CM

## 2020-09-04 DIAGNOSIS — B351 Tinea unguium: Secondary | ICD-10-CM

## 2020-09-04 DIAGNOSIS — E785 Hyperlipidemia, unspecified: Secondary | ICD-10-CM | POA: Diagnosis not present

## 2020-09-04 DIAGNOSIS — M79674 Pain in right toe(s): Secondary | ICD-10-CM

## 2020-09-04 DIAGNOSIS — N183 Chronic kidney disease, stage 3 unspecified: Secondary | ICD-10-CM

## 2020-09-04 DIAGNOSIS — M79675 Pain in left toe(s): Secondary | ICD-10-CM

## 2020-09-04 NOTE — Progress Notes (Signed)
This patient returns to my office for at risk foot care.  This patient requires this care by a professional since this patient will be at risk due to having diagnosis of pvd, coagulation defect  and CKD stage 3. He is taking plavix.   This patient is wearing unna boots both feet/legs.  This patient is unable to cut nails himself since the patient cannot reach his nails.These nails are painful walking and wearing shoes.  This patient presents for at risk foot care today.  General Appearance  Alert, conversant and in no acute stress.  Vascular  Deferred due to unna boots.  Neurologic  Deferred due to unna boots  Nails Thick disfigured discolored nails with subungual debris  from hallux to fifth toes bilaterally. No evidence of bacterial infection or drainage bilaterally.  Orthopedic  No limitations of motion  feet .  No crepitus or effusions noted.  No bony pathology or digital deformities noted.  Skin  normotropic skin with no porokeratosis noted bilaterally.  No signs of infections or ulcers noted.     Onychomycosis  Pain in right toes  Pain in left toes  Consent was obtained for treatment procedures.   Mechanical debridement of nails 1-5  bilaterally performed with a nail nipper.  Filed with dremel without incident.    Return office visit  3 months                    Told patient to return for periodic foot care and evaluation due to potential at risk complications.   Rosaleen Mazer DPM  

## 2020-09-09 ENCOUNTER — Encounter (INDEPENDENT_AMBULATORY_CARE_PROVIDER_SITE_OTHER): Payer: Self-pay | Admitting: Nurse Practitioner

## 2020-09-09 NOTE — Progress Notes (Signed)
Subjective:    Patient ID: Grant Mcdaniel, male    DOB: 1941/11/14, 79 y.o.   MRN: [id number] Chief Complaint  Patient presents with  . Follow-up    unna boot check    Patient's lower extremity edema is improving with diuretics however he still continues to have some swelling present in the thigh area.  The lower extremity edema is also improved.  The patient has been trying to utilize lymphedema pump on a more regular basis.  No new wounds or ulcerations.  No signs or symptoms of cellulitis.  He denies any fever, chills, nausea, vomiting or diarrhea.  He denies any complication with wearing medical grade 1 compression socks.   Review of Systems  Cardiovascular: Positive for leg swelling.  All other systems reviewed and are negative.      Objective:   Physical Exam Vitals reviewed.  HENT:     Head: Normocephalic.  Cardiovascular:     Rate and Rhythm: Normal rate and regular rhythm.     Pulses: Normal pulses.     Heart sounds: Normal heart sounds.  Pulmonary:     Effort: Pulmonary effort is normal.     Breath sounds: Normal breath sounds.  Musculoskeletal:     Right lower leg: 2+ Pitting Edema present.     Left lower leg: 2+ Pitting Edema present.  Neurological:     Mental Status: He is alert and oriented to person, place, and time.  Psychiatric:        Mood and Affect: Mood normal.        Behavior: Behavior normal.        Thought Content: Thought content normal.        Judgment: Judgment normal.     BP 132/65 (BP Location: Right Arm)   Pulse (!) 109   Resp 18   Ht 6\' 2"  (1.88 m)   Wt 221 lb (100.2 kg)   BMI 28.37 kg/m   Past Medical History:  Diagnosis Date  . CAD (coronary artery disease)   . 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 Diabetes mellitus without complication ([place])   . Hypercholesterolemia   . Hypertension   . Hypertension   . Hypothyroid   . PVD (peripheral vascular disease) ([place])   . Venous insufficiency     Social  History   Socioeconomic History  . Marital status: Married    Spouse name: Not on file  . Number of children: Not on file  . Years of education: Not on file  . Highest education level: Not on file  Occupational History  . Not on file  Tobacco Use  . Smoking status: Never Smoker  . Smokeless tobacco: Never Used  Substance and Sexual Activity  . Alcohol use: Never  . Drug use: Never  . Sexual activity: Not on file  Other Topics Concern  . Not on file  Social History Narrative  . Not on file   Social Determinants of Health   Financial Resource Strain:   . Difficulty of Paying Living Expenses: Not on file  Food Insecurity:   . Worried About Charity fundraiser in the Last Year: Not on file  . Ran Out of Food in the Last Year: Not on file  Transportation Needs:   . Lack of Transportation (Medical): Not on file  . Lack of Transportation (Non-Medical): Not on file  Physical Activity:   . Days of Exercise per Week: Not on file  .  Minutes of Exercise per Session: Not on file  Stress:   . Feeling of Stress : Not on file  Social Connections:   . Frequency of Communication with Friends and Family: Not on file  . Frequency of Social Gatherings with Friends and Family: Not on file  . Attends Religious Services: Not on file  . Active Member of Clubs or Organizations: Not on file  . Attends Archivist Meetings: Not on file  . Marital Status: Not on file  Intimate Partner Violence:   . Fear of Current or Ex-Partner: Not on file  . Emotionally Abused: Not on file  . Physically Abused: Not on file  . Sexually Abused: Not on file    Past Surgical History:  Procedure Laterality Date  . galbladder    . HIP ARTHROPLASTY Right 12/28/2019   Procedure: ARTHROPLASTY BIPOLAR HIP (HEMIARTHROPLASTY);  Surgeon: Thornton Park, MD;  Location: [facility] ORS;  Service: Orthopedics;  Laterality: Right;    Family History  Problem Relation Age of Onset  . Cancer Mother   . CAD Father      Allergies  Allergen Reactions  . Sulfa Antibiotics Nausea And Vomiting       Assessment & Plan:   1. Lymphedema  No surgery or intervention at this point in time.    I have reviewed my discussion with the patient regarding lymphedema and why it  causes symptoms.  Patient will continue wearing graduated compression stockings class 1 (20-30 mmHg) on a daily basis a prescription was given. The patient is reminded to put the stockings on first thing in the morning and removing them in the evening. The patient is instructed specifically not to sleep in the stockings.   In addition, behavioral modification throughout the day will be continued.  This will include frequent elevation (such as in a recliner), use of over the counter pain medications as needed and exercise such as walking.  The patient will continue aggressive use of the  lymph pump.  This will continue to improve the edema control and prevent sequela such as ulcers and infections.   Patient will remain in [place] wraps at this time.  He will present to the office on a weekly basis to have his wraps changed.  Patient is advised to continue with aggressive use of his lymphedema pump.  Patient is also advised to find medical grade 1 compression stockings that will fit as at next interaction we will see about a trial out of his compression wraps.   2. Essential hypertension Continue antihypertensive medications as already ordered, these medications have been reviewed and there are no changes at this time.   3. Hyperlipidemia, unspecified hyperlipidemia type Continue statin as ordered and reviewed, no changes at this time    Current Outpatient Medications on File Prior to Visit  Medication Sig Dispense Refill  . amLODipine-benazepril (LOTREL) 10-40 MG capsule Take 1 capsule by mouth daily.     Marland Kitchen aspirin EC 81 MG tablet Take 81 mg by mouth at bedtime.     Marland Kitchen atorvastatin (LIPITOR) 40 MG tablet Take 40 mg by mouth at bedtime.      . bisoprolol-hydrochlorothiazide (ZIAC) 10-6.25 MG tablet Take 1 tablet by mouth daily.    . Cholecalciferol 25 MCG (1000 UT) capsule Take 1,000 Units by mouth daily.     . clopidogrel (PLAVIX) 75 MG tablet Take 75 mg by mouth daily.    . Coenzyme Q10 10 MG capsule Take 10 mg by mouth daily.     Marland Kitchen  fluticasone (FLONASE) 50 MCG/ACT nasal spray Place 1 spray into both nostrils daily.    . furosemide (LASIX) 40 MG tablet Take 1 tablet (40 mg total) by mouth daily. 30 tablet 5  . levothyroxine (SYNTHROID) 75 MCG tablet Take 75 mcg by mouth daily before breakfast.     . linagliptin (TRADJENTA) 5 MG TABS tablet Take 5 mg by mouth daily.     Marland Kitchen loratadine (CLARITIN) 10 MG tablet Take 10 mg by mouth daily.    . Multiple Vitamin (MULTIVITAMIN WITH MINERALS) TABS tablet Take 1 tablet by mouth daily.    . Omega-3 Fatty Acids (FISH OIL TRIPLE STRENGTH) 1400 MG CAPS Take 1 capsule by mouth daily.    . tamsulosin (FLOMAX) 0.4 MG CAPS capsule Take 0.4 mg by mouth daily.     . vitamin B-12 (CYANOCOBALAMIN) 500 MCG tablet Take 500 mcg by mouth daily.     No current facility-administered medications on file prior to visit.    There are no Patient Instructions on file for this visit. No follow-ups on file.   Kris Hartmann, NP

## 2020-09-11 ENCOUNTER — Ambulatory Visit (INDEPENDENT_AMBULATORY_CARE_PROVIDER_SITE_OTHER): Payer: Medicare Other | Admitting: Nurse Practitioner

## 2020-09-11 ENCOUNTER — Other Ambulatory Visit: Payer: Self-pay

## 2020-09-11 VITALS — BP 146/66 | HR 112 | Ht 74.0 in | Wt 218.0 lb

## 2020-09-11 DIAGNOSIS — I890 Lymphedema, not elsewhere classified: Secondary | ICD-10-CM | POA: Diagnosis not present

## 2020-09-11 NOTE — Progress Notes (Signed)
History of Present Illness  There is no documented history at this time  Assessments & Plan   There are no diagnoses linked to this encounter.    Additional instructions  Subjective:  Patient presents with venous ulcer of the Bilateral lower extremity.    Procedure:  3 layer unna wrap was placed Bilateral lower extremity.   Plan:   Follow up in one week.  

## 2020-09-12 ENCOUNTER — Encounter (INDEPENDENT_AMBULATORY_CARE_PROVIDER_SITE_OTHER): Payer: Self-pay | Admitting: Nurse Practitioner

## 2020-09-18 ENCOUNTER — Ambulatory Visit (INDEPENDENT_AMBULATORY_CARE_PROVIDER_SITE_OTHER): Payer: Medicare Other | Admitting: Nurse Practitioner

## 2020-09-18 ENCOUNTER — Other Ambulatory Visit: Payer: Self-pay

## 2020-09-18 ENCOUNTER — Encounter (INDEPENDENT_AMBULATORY_CARE_PROVIDER_SITE_OTHER): Payer: Self-pay | Admitting: Nurse Practitioner

## 2020-09-18 VITALS — BP 138/53 | HR 74 | Ht 74.0 in | Wt 212.0 lb

## 2020-09-18 DIAGNOSIS — I890 Lymphedema, not elsewhere classified: Secondary | ICD-10-CM | POA: Diagnosis not present

## 2020-09-18 NOTE — Progress Notes (Signed)
History of Present Illness  There is no documented history at this time  Assessments & Plan   There are no diagnoses linked to this encounter.    Additional instructions  Subjective:  Patient presents with venous ulcer of the Bilateral lower extremity.    Procedure:  3 layer unna wrap was placed Bilateral lower extremity.   Plan:   Follow up in one week.  

## 2020-09-24 ENCOUNTER — Ambulatory Visit (INDEPENDENT_AMBULATORY_CARE_PROVIDER_SITE_OTHER): Payer: Medicare Other | Admitting: Nurse Practitioner

## 2020-09-25 ENCOUNTER — Ambulatory Visit (INDEPENDENT_AMBULATORY_CARE_PROVIDER_SITE_OTHER): Payer: Medicare Other | Admitting: Nurse Practitioner

## 2020-09-25 ENCOUNTER — Other Ambulatory Visit: Payer: Self-pay

## 2020-09-25 ENCOUNTER — Encounter (INDEPENDENT_AMBULATORY_CARE_PROVIDER_SITE_OTHER): Payer: Self-pay | Admitting: Nurse Practitioner

## 2020-09-25 VITALS — BP 150/68 | HR 112 | Ht 74.0 in | Wt 218.0 lb

## 2020-09-25 DIAGNOSIS — I10 Essential (primary) hypertension: Secondary | ICD-10-CM | POA: Diagnosis not present

## 2020-09-25 DIAGNOSIS — I890 Lymphedema, not elsewhere classified: Secondary | ICD-10-CM

## 2020-09-25 DIAGNOSIS — E785 Hyperlipidemia, unspecified: Secondary | ICD-10-CM | POA: Diagnosis not present

## 2020-09-29 ENCOUNTER — Other Ambulatory Visit: Payer: Self-pay

## 2020-09-29 ENCOUNTER — Ambulatory Visit (INDEPENDENT_AMBULATORY_CARE_PROVIDER_SITE_OTHER): Payer: Medicare Other | Admitting: Nurse Practitioner

## 2020-09-29 VITALS — BP 134/68 | HR 92 | Resp 16 | Wt 215.6 lb

## 2020-09-29 DIAGNOSIS — I890 Lymphedema, not elsewhere classified: Secondary | ICD-10-CM | POA: Diagnosis not present

## 2020-09-29 NOTE — Progress Notes (Signed)
History of Present Illness  There is no documented history at this time  Assessments & Plan   There are no diagnoses linked to this encounter.    Additional instructions  Subjective:  Patient presents with venous ulcer of the Bilateral lower extremity.    Procedure:  3 layer unna wrap was placed Bilateral lower extremity.   Plan:   Follow up in one week.  

## 2020-09-30 ENCOUNTER — Encounter (INDEPENDENT_AMBULATORY_CARE_PROVIDER_SITE_OTHER): Payer: Self-pay | Admitting: Nurse Practitioner

## 2020-09-30 NOTE — Progress Notes (Signed)
Subjective:    Patient ID: Grant Mcdaniel, male    DOB: [birth year], 79 y.o.   MRN: [id number] Chief Complaint  Patient presents with  . Follow-up    BIL unna boot check    Patient returns today for evaluation of his bilateral lower extremity lymphedema.  The patient continues with his diuretics and the swelling above his knees has greatly decreased.  The lower extremities that have been in Unna wraps appear much improved.  There is very little swelling below his knee.  There are no blisters or ulcerations.  Overall the patient states that his legs feel well.  The patient also continue to utilize his lymphedema pump.   Review of Systems  Cardiovascular: Positive for leg swelling.  Musculoskeletal: Positive for gait problem.  All other systems reviewed and are negative.      Objective:   Physical Exam Vitals reviewed.  HENT:     Head: Normocephalic.  Cardiovascular:     Rate and Rhythm: Normal rate.     Pulses: Normal pulses.  Pulmonary:     Effort: Pulmonary effort is normal.  Skin:    General: Skin is warm and dry.  Neurological:     Mental Status: He is alert and oriented to person, place, and time.     Motor: Weakness present.     Gait: Gait abnormal.  Psychiatric:        Mood and Affect: Mood normal.        Behavior: Behavior normal.        Thought Content: Thought content normal.        Judgment: Judgment normal.     BP (!) 150/68   Pulse (!) 112   Ht 6\' 2"  (1.88 m)   Wt 218 lb (98.9 kg)   BMI 27.99 kg/m   Past Medical History:  Diagnosis Date  . CAD (coronary artery disease)   . CKD (chronic kidney disease) stage 3, GFR 30-59 ml/min ([facility])   . Diabetes mellitus without complication ([employer])   . Hypercholesterolemia   . Hypertension   . Hypertension   . Hypothyroid   . PVD (peripheral vascular disease) ([place])   . Venous insufficiency     Social History   Socioeconomic History  . Marital status: Married    Spouse name: Not on file  . Number of  children: Not on file  . Years of education: Not on file  . Highest education level: Not on file  Occupational History  . Not on file  Tobacco Use  . Smoking status: Never Smoker  . Smokeless tobacco: Never Used  Substance and Sexual Activity  . Alcohol use: Never  . Drug use: Never  . Sexual activity: Not on file  Other Topics Concern  . Not on file  Social History Narrative  . Not on file   Social Determinants of Health   Financial Resource Strain:   . Difficulty of Paying Living Expenses: Not on file  Food Insecurity:   . Worried About Charity fundraiser in the Last Year: Not on file  . Ran Out of Food in the Last Year: Not on file  Transportation Needs:   . Lack of Transportation (Medical): Not on file  . Lack of Transportation (Non-Medical): Not on file  Physical Activity:   . Days of Exercise per Week: Not on file  . Minutes of Exercise per Session: Not on file  Stress:   . Feeling of Stress : Not on file  Social  Connections:   . Frequency of Communication with Friends and Family: Not on file  . Frequency of Social Gatherings with Friends and Family: Not on file  . Attends Religious Services: Not on file  . Active Member of Clubs or Organizations: Not on file  . Attends Archivist Meetings: Not on file  . Marital Status: Not on file  Intimate Partner Violence:   . Fear of Current or Ex-Partner: Not on file  . Emotionally Abused: Not on file  . Physically Abused: Not on file  . Sexually Abused: Not on file    Past Surgical History:  Procedure Laterality Date  . galbladder    . HIP ARTHROPLASTY Right 12/28/2019   Procedure: ARTHROPLASTY BIPOLAR HIP (HEMIARTHROPLASTY);  Surgeon: Thornton Park, MD;  Location: [facility] ORS;  Service: Orthopedics;  Laterality: Right;    Family History  Problem Relation Age of Onset  . Cancer Mother   . CAD Father     Allergies  Allergen Reactions  . Sulfa Antibiotics Nausea And Vomiting       Assessment & Plan:    1. Lymphedema The patient has no wounds or ulcerations.  Previously it was discussed with patient to obtain medical grade 1 compression socks for transition.  At this point we will remove the patient from the wraps to see how he is able to tolerate utilizing medical grade 1 compression stockings.  Patient is advised that if he has new ulcers or blisters to contact her office as we may need to replace Unna wraps.  Otherwise we will have the patient back in 4 weeks to check his progress.  2. Essential hypertension Continue antihypertensive medications as already ordered, these medications have been reviewed and there are no changes at this time.   3. Hyperlipidemia, unspecified hyperlipidemia type Continue statin as ordered and reviewed, no changes at this time   Current Outpatient Medications on File Prior to Visit  Medication Sig Dispense Refill  . amLODipine-benazepril (LOTREL) 10-40 MG capsule Take 1 capsule by mouth daily.     Marland Kitchen aspirin EC 81 MG tablet Take 81 mg by mouth at bedtime.     Marland Kitchen atorvastatin (LIPITOR) 40 MG tablet Take 40 mg by mouth at bedtime.     . bisoprolol-hydrochlorothiazide (ZIAC) 10-6.25 MG tablet Take 1 tablet by mouth daily.    . Cholecalciferol 25 MCG (1000 UT) capsule Take 1,000 Units by mouth daily.     . clopidogrel (PLAVIX) 75 MG tablet Take 75 mg by mouth daily.    . Coenzyme Q10 10 MG capsule Take 10 mg by mouth daily.     . fluticasone (FLONASE) 50 MCG/ACT nasal spray Place 1 spray into both nostrils daily.    . furosemide (LASIX) 40 MG tablet Take 1 tablet (40 mg total) by mouth daily. 30 tablet 5  . levothyroxine (SYNTHROID) 75 MCG tablet Take 75 mcg by mouth daily before breakfast.     . linagliptin (TRADJENTA) 5 MG TABS tablet Take 5 mg by mouth daily.     Marland Kitchen loratadine (CLARITIN) 10 MG tablet Take 10 mg by mouth daily.    . Multiple Vitamin (MULTIVITAMIN WITH MINERALS) TABS tablet Take 1 tablet by mouth daily.    . Omega-3 Fatty Acids (FISH OIL  TRIPLE STRENGTH) 1400 MG CAPS Take 1 capsule by mouth daily.    . tamsulosin (FLOMAX) 0.4 MG CAPS capsule Take 0.4 mg by mouth daily.     . vitamin B-12 (CYANOCOBALAMIN) 500 MCG tablet Take 500 mcg  by mouth daily.     No current facility-administered medications on file prior to visit.    There are no Patient Instructions on file for this visit. No follow-ups on file.   Kris Hartmann, NP

## 2020-10-01 ENCOUNTER — Encounter (INDEPENDENT_AMBULATORY_CARE_PROVIDER_SITE_OTHER): Payer: Self-pay | Admitting: Nurse Practitioner

## 2020-10-03 ENCOUNTER — Ambulatory Visit: Payer: Medicare Other | Admitting: Cardiology

## 2020-10-06 ENCOUNTER — Ambulatory Visit (INDEPENDENT_AMBULATORY_CARE_PROVIDER_SITE_OTHER): Payer: Medicare Other | Admitting: Nurse Practitioner

## 2020-10-06 ENCOUNTER — Other Ambulatory Visit: Payer: Self-pay

## 2020-10-06 VITALS — BP 127/64 | HR 112 | Resp 16 | Wt 218.0 lb

## 2020-10-06 DIAGNOSIS — I83009 Varicose veins of unspecified lower extremity with ulcer of unspecified site: Secondary | ICD-10-CM | POA: Diagnosis not present

## 2020-10-06 DIAGNOSIS — L97909 Non-pressure chronic ulcer of unspecified part of unspecified lower leg with unspecified severity: Secondary | ICD-10-CM | POA: Diagnosis not present

## 2020-10-06 NOTE — Progress Notes (Signed)
History of Present Illness  There is no documented history at this time  Assessments & Plan   There are no diagnoses linked to this encounter.    Additional instructions  Subjective:  Patient presents with venous ulcer of the Bilateral lower extremity.    Procedure:  3 layer unna wrap was placed Bilateral lower extremity.   Plan:   Follow up in one week.  

## 2020-10-09 ENCOUNTER — Encounter (INDEPENDENT_AMBULATORY_CARE_PROVIDER_SITE_OTHER): Payer: Medicare Other | Admitting: Vascular Surgery

## 2020-10-10 ENCOUNTER — Other Ambulatory Visit: Payer: Self-pay

## 2020-10-10 ENCOUNTER — Ambulatory Visit (INDEPENDENT_AMBULATORY_CARE_PROVIDER_SITE_OTHER): Payer: Medicare Other | Admitting: Cardiology

## 2020-10-10 ENCOUNTER — Encounter: Payer: Self-pay | Admitting: Cardiology

## 2020-10-10 ENCOUNTER — Encounter (INDEPENDENT_AMBULATORY_CARE_PROVIDER_SITE_OTHER): Payer: Self-pay | Admitting: Nurse Practitioner

## 2020-10-10 VITALS — BP 138/74 | HR 110 | Ht 72.0 in | Wt 220.0 lb

## 2020-10-10 DIAGNOSIS — I2510 Atherosclerotic heart disease of native coronary artery without angina pectoris: Secondary | ICD-10-CM | POA: Diagnosis not present

## 2020-10-10 DIAGNOSIS — R600 Localized edema: Secondary | ICD-10-CM

## 2020-10-10 DIAGNOSIS — I10 Essential (primary) hypertension: Secondary | ICD-10-CM | POA: Diagnosis not present

## 2020-10-10 MED ORDER — BISOPROLOL FUMARATE 10 MG PO TABS: 10.0000 mg | ORAL_TABLET | Freq: Every day | ORAL | 5 refills | Status: DC

## 2020-10-10 MED ORDER — BENAZEPRIL HCL 40 MG PO TABS: 40.0000 mg | ORAL_TABLET | Freq: Every day | ORAL | 5 refills | Status: DC

## 2020-10-10 NOTE — Progress Notes (Signed)
Cardiology Office Note:    Date:  10/10/2020   ID:  Grant Mcdaniel, DOB 08-07-1941, MRN [id number]  PCP:  Marguerita Merles, MD  [facility] Cardiologist:  Kate Sable, MD  [place] Electrophysiologist:  None   Referring MD: Marguerita Merles, MD   Chief Complaint  Patient presents with  . Follow-up    6 weeks  Pt states no new Sx.    History of Present Illness:    Grant Mcdaniel is a 79 y.o. male with a hx of CAD (PCI/DES to [place] in 2004 at [facility]) hypertension, CKD-III, diabetes, lymphedema, who presents due to edema.  Patient being followed the vein clinic due to lower extremity edema.  Was started on Lasix 40 mg daily to help with edema.  Symptoms have stayed the same or may be slightly improved.  Denies chest pain or shortness of breath, takes all medications as prescribed.  Prior notes Echocardiogram on 12/2019 showed normal systolic and diastolic function, EF 55 to 60%. Unna boots, wraps have been used to help with patient's symptoms.  This has not been very successful.  Worsening edema around the knees when noted.  There is need for diuretic therapy/Lasix to help with patient's edema.  He denies any chest pain or shortness of breath since his stent placement in 2004.  Compliant aspirin, Plavix, statin.  Past Medical History:  Diagnosis Date  . CAD (coronary artery disease)   . CKD (chronic kidney disease) stage 3, GFR 30-59 ml/min (HCC)   . Diabetes mellitus without complication ([place])   . Hypercholesterolemia   . Hypertension   . Hypertension   . Hypothyroid   . PVD (peripheral vascular disease) ([place])   . Venous insufficiency     Past Surgical History:  Procedure Laterality Date  . galbladder    . HIP ARTHROPLASTY Right 12/28/2019   Procedure: ARTHROPLASTY BIPOLAR HIP (HEMIARTHROPLASTY);  Surgeon: Thornton Park, MD;  Location: [facility] ORS;  Service: Orthopedics;  Laterality: Right;    Current Medications: Current Meds  Medication Sig  . aspirin EC 81  MG tablet Take 81 mg by mouth at bedtime.   Marland Kitchen atorvastatin (LIPITOR) 40 MG tablet Take 40 mg by mouth at bedtime.   . Cholecalciferol 25 MCG (1000 UT) capsule Take 1,000 Units by mouth daily.   . clopidogrel (PLAVIX) 75 MG tablet Take 75 mg by mouth daily.  . Coenzyme Q10 10 MG capsule Take 10 mg by mouth daily.   . fluticasone (FLONASE) 50 MCG/ACT nasal spray Place 1 spray into both nostrils daily.  . furosemide (LASIX) 40 MG tablet Take 1 tablet (40 mg total) by mouth daily.  Marland Kitchen levothyroxine (SYNTHROID) 75 MCG tablet Take 75 mcg by mouth daily before breakfast.   . linagliptin (TRADJENTA) 5 MG TABS tablet Take 5 mg by mouth daily.   Marland Kitchen loratadine (CLARITIN) 10 MG tablet Take 10 mg by mouth daily.  . Multiple Vitamin (MULTIVITAMIN WITH MINERALS) TABS tablet Take 1 tablet by mouth daily.  . Omega-3 Fatty Acids (FISH OIL TRIPLE STRENGTH) 1400 MG CAPS Take 1 capsule by mouth daily.  . tamsulosin (FLOMAX) 0.4 MG CAPS capsule Take 0.4 mg by mouth daily.   . vitamin B-12 (CYANOCOBALAMIN) 500 MCG tablet Take 500 mcg by mouth daily.  . [DISCONTINUED] amLODipine-benazepril (LOTREL) 10-40 MG capsule Take 1 capsule by mouth daily.      Allergies:   Sulfa antibiotics   Social History   Socioeconomic History  . Marital status: Married  Spouse name: Not on file  . Number of children: Not on file  . Years of education: Not on file  . Highest education level: Not on file  Occupational History  . Not on file  Tobacco Use  . Smoking status: Never Smoker  . Smokeless tobacco: Never Used  Substance and Sexual Activity  . Alcohol use: Never  . Drug use: Never  . Sexual activity: Not on file  Other Topics Concern  . Not on file  Social History Narrative  . Not on file   Social Determinants of Health   Financial Resource Strain:   . Difficulty of Paying Living Expenses: Not on file  Food Insecurity:   . Worried About Charity fundraiser in the Last Year: Not on file  . Ran Out of Food in  the Last Year: Not on file  Transportation Needs:   . Lack of Transportation (Medical): Not on file  . Lack of Transportation (Non-Medical): Not on file  Physical Activity:   . Days of Exercise per Week: Not on file  . Minutes of Exercise per Session: Not on file  Stress:   . Feeling of Stress : Not on file  Social Connections:   . Frequency of Communication with Friends and Family: Not on file  . Frequency of Social Gatherings with Friends and Family: Not on file  . Attends Religious Services: Not on file  . Active Member of Clubs or Organizations: Not on file  . Attends Archivist Meetings: Not on file  . Marital Status: Not on file     Family History: The patient's family history includes CAD in his father; Cancer in his mother.  ROS:   Please see the history of present illness.     All other systems reviewed and are negative.  EKGs/Labs/Other Studies Reviewed:    The following studies were reviewed today:   EKG:  EKG not ordered today.   Recent Labs: 01/04/2020: ALT 36 01/05/2020: Magnesium 2.1 01/06/2020: BUN 30; Creatinine, Ser 1.21; Hemoglobin 8.6; Platelets 205; Potassium 4.0; Sodium 139  Recent Lipid Panel    Component Value Date/Time   CHOL 113 12/30/2019 0341   TRIG 129 12/30/2019 0341   HDL 30 (L) 12/30/2019 0341   CHOLHDL 3.8 12/30/2019 0341   VLDL 26 12/30/2019 0341   LDLCALC 57 12/30/2019 0341    Physical Exam:    VS:  BP 138/74   Pulse (!) 110   Ht 6' (1.829 m)   Wt 220 lb (99.8 kg)   BMI 29.84 kg/m     Wt Readings from Last 3 Encounters:  10/10/20 220 lb (99.8 kg)  10/06/20 218 lb (98.9 kg)  09/29/20 215 lb 9.6 oz (97.8 kg)     GEN:  Well nourished, well developed in no acute distress HEENT: Normal NECK: No JVD; No carotid bruits LYMPHATICS: No lymphadenopathy CARDIAC: RRR, no murmurs, rubs, gallops RESPIRATORY:  Clear to auscultation without rales, wheezing or rhonchi  ABDOMEN: Soft, non-tender,  non-distended MUSCULOSKELETAL:  1-2+ edema around the knees SKIN: Warm and dry NEUROLOGIC:  Alert and oriented x 3 PSYCHIATRIC:  Normal affect   ASSESSMENT:    1. Coronary artery disease involving native coronary artery of native heart without angina pectoris   2. Edema leg   3. Essential hypertension    PLAN:    In order of problems listed above:  1. history of CAD s/p PCI with DES to first obtuse marginal 2004.  Currently denies symptoms of chest  pain.  Continue aspirin, Plavix, Lipitor.  Echo 06/6719 normal systolic and diastolic function, EF 55 to 60%. 2. Lower extremity edema noted.  Stop Norvasc 10mg  in case this is contributing to edema.  Continue Lasix 40 mg daily.  Check BMP prior to follow-up visit.  Etiology for edema multifactorial including component of lymphedema, CKD.  Last echocardiogram showed normal systolic function, diastolic function also normal.   3. History of hypertension, BP controlled, tachycardic.  Stop amlodipine.  Continue benazepril 40 mg daily.  Start bisoprolol 10 mg daily.  Follow-up in 2 months..  Total encounter time 40 minutes  Greater than 50% was spent in counseling and coordination of care with the patient   Medication Adjustments/Labs and Tests Ordered: Current medicines are reviewed at length with the patient today.  Concerns regarding medicines are outlined above.  Orders Placed This Encounter  Procedures  . Basic metabolic panel   Meds ordered this encounter  Medications  . benazepril (LOTENSIN) 40 MG tablet    Sig: Take 1 tablet (40 mg total) by mouth daily.    Dispense:  30 tablet    Refill:  5  . bisoprolol (ZEBETA) 10 MG tablet    Sig: Take 1 tablet (10 mg total) by mouth daily.    Dispense:  30 tablet    Refill:  5    Patient Instructions  Medication Instructions:  Your physician has recommended you make the following change in your medication:   1) STOP Lotrel 2) STOP Ziac  3) START Benazepril 40 mg daily. An Rx has  been sent to your pharmacy. 4) START Bisoprolol 10 mg daily. An Rx has been sent to your pharmacy.  *If you need a refill on your cardiac medications before your next appointment, please call your pharmacy*   Lab Work: Your physician recommends that you return for lab work (bmet) in Jan 2022 prior to your appt.  Please have your lab drawn at the [facility] medical mall. You do not need an appt. Lab hours are Mon-Fri 7am-6pm.   If you have labs (blood work) drawn today and your tests are completely normal, you will receive your results only by: Marland Kitchen MyChart Message (if you have MyChart) OR . A paper copy in the mail If you have any lab test that is abnormal or we need to change your treatment, we will call you to review the results.   Testing/Procedures: None ordered   Follow-Up: At [facility], you and your health needs are our priority.  As part of our continuing mission to provide you with exceptional heart care, we have created designated Provider Care Teams.  These Care Teams include your primary Cardiologist (physician) and Advanced Practice Providers (APPs -  Physician Assistants and Nurse Practitioners) who all work together to provide you with the care you need, when you need it.  We recommend signing up for the patient portal called "MyChart".  Sign up information is provided on this After Visit Summary.  MyChart is used to connect with patients for Virtual Visits (Telemedicine).  Patients are able to view lab/test results, encounter notes, upcoming appointments, etc.  Non-urgent messages can be sent to your provider as well.   To learn more about what you can do with MyChart, go to [email or web address].    Your next appointment:   Jan 2022   The format for your next appointment:   In Person  Provider:   You may see Kate Sable, MD or one of the following Advanced Practice Providers  on your designated Care Team:    Murray Hodgkins, NP  Christell Faith,  PA-C  Marrianne Mood, PA-C  Cadence [place], [place]    Other Instructions N/A     Signed, Kate Sable, MD  10/10/2020 5:12 PM    [place]

## 2020-10-10 NOTE — Patient Instructions (Signed)
Medication Instructions:  Your physician has recommended you make the following change in your medication:   1) STOP Lotrel 2) STOP Ziac  3) START Benazepril 40 mg daily. An Rx has been sent to your pharmacy. 4) START Bisoprolol 10 mg daily. An Rx has been sent to your pharmacy.  *If you need a refill on your cardiac medications before your next appointment, please call your pharmacy*   Lab Work: Your physician recommends that you return for lab work (bmet) in Jan 2022 prior to your appt.  Please have your lab drawn at the [facility] medical mall. You do not need an appt. Lab hours are Mon-Fri 7am-6pm.   If you have labs (blood work) drawn today and your tests are completely normal, you will receive your results only by: Marland Kitchen MyChart Message (if you have MyChart) OR . A paper copy in the mail If you have any lab test that is abnormal or we need to change your treatment, we will call you to review the results.   Testing/Procedures: None ordered   Follow-Up: At [facility], you and your health needs are our priority.  As part of our continuing mission to provide you with exceptional heart care, we have created designated Provider Care Teams.  These Care Teams include your primary Cardiologist (physician) and Advanced Practice Providers (APPs -  Physician Assistants and Nurse Practitioners) who all work together to provide you with the care you need, when you need it.  We recommend signing up for the patient portal called "MyChart".  Sign up information is provided on this After Visit Summary.  MyChart is used to connect with patients for Virtual Visits (Telemedicine).  Patients are able to view lab/test results, encounter notes, upcoming appointments, etc.  Non-urgent messages can be sent to your provider as well.   To learn more about what you can do with MyChart, go to [email or web address].    Your next appointment:   Jan 2022   The format for your next appointment:   In  Person  Provider:   You may see Kate Sable, MD or one of the following Advanced Practice Providers on your designated Care Team:    Murray Hodgkins, NP  Christell Faith, PA-C  Marrianne Mood, PA-C  Cadence [place], [place]    Other Instructions N/A

## 2020-10-13 ENCOUNTER — Other Ambulatory Visit: Payer: Self-pay

## 2020-10-13 ENCOUNTER — Ambulatory Visit (INDEPENDENT_AMBULATORY_CARE_PROVIDER_SITE_OTHER): Payer: Medicare Other | Admitting: Nurse Practitioner

## 2020-10-13 VITALS — BP 92/56 | HR 103 | Ht 74.0 in | Wt 218.0 lb

## 2020-10-13 DIAGNOSIS — I83009 Varicose veins of unspecified lower extremity with ulcer of unspecified site: Secondary | ICD-10-CM | POA: Diagnosis not present

## 2020-10-13 DIAGNOSIS — L97909 Non-pressure chronic ulcer of unspecified part of unspecified lower leg with unspecified severity: Secondary | ICD-10-CM | POA: Diagnosis not present

## 2020-10-13 NOTE — Progress Notes (Signed)
History of Present Illness  There is no documented history at this time  Assessments & Plan   There are no diagnoses linked to this encounter.    Additional instructions  Subjective:  Patient presents with venous ulcer of the Bilateral lower extremity.    Procedure:  3 layer unna wrap was placed Bilateral lower extremity.   Plan:   Follow up in one week.  

## 2020-10-15 ENCOUNTER — Encounter (INDEPENDENT_AMBULATORY_CARE_PROVIDER_SITE_OTHER): Payer: Self-pay | Admitting: Nurse Practitioner

## 2020-10-23 ENCOUNTER — Ambulatory Visit (INDEPENDENT_AMBULATORY_CARE_PROVIDER_SITE_OTHER): Payer: Medicare Other | Admitting: Nurse Practitioner

## 2020-10-24 ENCOUNTER — Encounter (INDEPENDENT_AMBULATORY_CARE_PROVIDER_SITE_OTHER): Payer: Self-pay | Admitting: Nurse Practitioner

## 2020-10-24 ENCOUNTER — Ambulatory Visit (INDEPENDENT_AMBULATORY_CARE_PROVIDER_SITE_OTHER): Payer: Medicare Other | Admitting: Nurse Practitioner

## 2020-10-24 ENCOUNTER — Other Ambulatory Visit: Payer: Self-pay

## 2020-10-24 VITALS — BP 110/61 | HR 93 | Resp 16 | Wt 221.0 lb

## 2020-10-24 DIAGNOSIS — I890 Lymphedema, not elsewhere classified: Secondary | ICD-10-CM | POA: Diagnosis not present

## 2020-10-24 DIAGNOSIS — E119 Type 2 diabetes mellitus without complications: Secondary | ICD-10-CM

## 2020-10-24 DIAGNOSIS — I10 Essential (primary) hypertension: Secondary | ICD-10-CM

## 2020-10-25 ENCOUNTER — Encounter (INDEPENDENT_AMBULATORY_CARE_PROVIDER_SITE_OTHER): Payer: Self-pay | Admitting: Nurse Practitioner

## 2020-10-25 NOTE — Progress Notes (Signed)
Subjective:    Patient ID: Grant Mcdaniel, male    DOB: May 04, 1941, 79 y.o.   MRN: [id number] Chief Complaint  Patient presents with  . Follow-up    4wk unna wrap check    Patient returns today for evaluation of his bilateral lower extremity lymphedema.  The patient continues with his diuretics and the swelling above his knees has greatly decreased.  The lower extremities that have been in Unna wraps appear much improved.  There is very little swelling below his knee.  There are no blisters or ulcerations.  Previously when the patient was removed from the wraps he began to have blisters and ulcerations in several days.  Overall the patient states that his legs feel well.  The patient also continue to utilize his lymphedema pump.   Review of Systems  Cardiovascular: Positive for leg swelling.  All other systems reviewed and are negative.      Objective:   Physical Exam Vitals reviewed.  HENT:     Head: Normocephalic.  Cardiovascular:     Rate and Rhythm: Normal rate and regular rhythm.     Pulses: Normal pulses.  Pulmonary:     Effort: Pulmonary effort is normal.     Breath sounds: Normal breath sounds.  Musculoskeletal:     Right lower leg: Edema present.     Left lower leg: Edema present.  Skin:    General: Skin is warm and dry.  Neurological:     Mental Status: He is alert and oriented to person, place, and time.  Psychiatric:        Mood and Affect: Mood normal.        Behavior: Behavior normal.        Thought Content: Thought content normal.        Judgment: Judgment normal.     BP 110/61 (BP Location: Right Arm)   Pulse 93   Resp 16   Wt 221 lb (100.2 kg)   BMI 28.37 kg/m   Past Medical History:  Diagnosis Date  . CAD (coronary artery disease)   . CKD (chronic kidney disease) stage 3, GFR 30-59 ml/min (HCC)   . Diabetes mellitus without complication ([place])   . Hypercholesterolemia   . Hypertension   . Hypertension   . Hypothyroid   . PVD (peripheral  vascular disease) ([place])   . Venous insufficiency     Social History   Socioeconomic History  . Marital status: Married    Spouse name: Not on file  . Number of children: Not on file  . Years of education: Not on file  . Highest education level: Not on file  Occupational History  . Not on file  Tobacco Use  . Smoking status: Never Smoker  . Smokeless tobacco: Never Used  Substance and Sexual Activity  . Alcohol use: Never  . Drug use: Never  . Sexual activity: Not on file  Other Topics Concern  . Not on file  Social History Narrative  . Not on file   Social Determinants of Health   Financial Resource Strain:   . Difficulty of Paying Living Expenses: Not on file  Food Insecurity:   . Worried About Charity fundraiser in the Last Year: Not on file  . Ran Out of Food in the Last Year: Not on file  Transportation Needs:   . Lack of Transportation (Medical): Not on file  . Lack of Transportation (Non-Medical): Not on file  Physical Activity:   . Days  of Exercise per Week: Not on file  . Minutes of Exercise per Session: Not on file  Stress:   . Feeling of Stress : Not on file  Social Connections:   . Frequency of Communication with Friends and Family: Not on file  . Frequency of Social Gatherings with Friends and Family: Not on file  . Attends Religious Services: Not on file  . Active Member of Clubs or Organizations: Not on file  . Attends Archivist Meetings: Not on file  . Marital Status: Not on file  Intimate Partner Violence:   . Fear of Current or Ex-Partner: Not on file  . Emotionally Abused: Not on file  . Physically Abused: Not on file  . Sexually Abused: Not on file    Past Surgical History:  Procedure Laterality Date  . galbladder    . HIP ARTHROPLASTY Right 12/28/2019   Procedure: ARTHROPLASTY BIPOLAR HIP (HEMIARTHROPLASTY);  Surgeon: Thornton Park, MD;  Location: [facility] ORS;  Service: Orthopedics;  Laterality: Right;    Family History    Problem Relation Age of Onset  . Cancer Mother   . CAD Father     Allergies  Allergen Reactions  . Sulfa Antibiotics Nausea And Vomiting    CBC Latest Ref Rng & Units 01/06/2020 01/05/2020 01/04/2020  WBC 4.0 - 10.5 K/uL 11.2(H) 12.8(H) 20.6(H)  Hemoglobin 13.0 - 17.0 g/dL 8.6(L) 8.6(L) 8.3(L)  Hematocrit 39 - 52 % 27.1(L) 26.4(L) 24.9(L)  Platelets 150 - 400 K/uL 205 194 179      CMP     Component Value Date/Time   NA 139 01/06/2020 0521   K 4.0 01/06/2020 0521   CL 106 01/06/2020 0521   CO2 25 01/06/2020 0521   GLUCOSE 142 (H) 01/06/2020 0521   BUN 30 (H) 01/06/2020 0521   CREATININE 1.21 01/06/2020 0521   CALCIUM 10.3 01/06/2020 0521   PROT 5.9 (L) 01/04/2020 0856   ALBUMIN 2.6 (L) 01/04/2020 0856   AST 45 (H) 01/04/2020 0856   ALT 36 01/04/2020 0856   ALKPHOS 187 (H) 01/04/2020 0856   BILITOT 2.7 (H) 01/04/2020 0856   GFRNONAA 57 (L) 01/06/2020 0521   GFRAA >60 01/06/2020 0521     @COAG @  Radiology     Assessment & Plan:   1. Lymphedema Although the patient's lower extremity edema is much improved at this time, he still does not have wraps available for transition.  Typically once the patient is removed from his Unna wraps he blisters within several days.  A long discussion was held with the patient in regards to compression and controlling lower extremity edema.  The patient also will continue to utilize his lymphedema pump.  The patient will continue with wraps on a weekly basis.  We will have him follow-up in 6 weeks for reevaluation.  2. Essential hypertension Continue antihypertensive medications as already ordered, these medications have been reviewed and there are no changes at this time.   3. Type 2 diabetes mellitus without complication, without long-term current use of insulin (HCC) Continue hypoglycemic medications as already ordered, these medications have been reviewed and there are no changes at this time.  Hgb A1C to be monitored as already  arranged by primary service    Current Outpatient Medications on File Prior to Visit  Medication Sig Dispense Refill  . aspirin EC 81 MG tablet Take 81 mg by mouth at bedtime.     Marland Kitchen atorvastatin (LIPITOR) 40 MG tablet Take 40 mg by mouth at bedtime.     Marland Kitchen  benazepril (LOTENSIN) 40 MG tablet Take 1 tablet (40 mg total) by mouth daily. 30 tablet 5  . bisoprolol (ZEBETA) 10 MG tablet Take 1 tablet (10 mg total) by mouth daily. 30 tablet 5  . Cholecalciferol 25 MCG (1000 UT) capsule Take 1,000 Units by mouth daily.     . clopidogrel (PLAVIX) 75 MG tablet Take 75 mg by mouth daily.    . Coenzyme Q10 10 MG capsule Take 10 mg by mouth daily.     . fluticasone (FLONASE) 50 MCG/ACT nasal spray Place 1 spray into both nostrils daily.    . furosemide (LASIX) 40 MG tablet Take 1 tablet (40 mg total) by mouth daily. 30 tablet 5  . levothyroxine (SYNTHROID) 75 MCG tablet Take 75 mcg by mouth daily before breakfast.     . linagliptin (TRADJENTA) 5 MG TABS tablet Take 5 mg by mouth daily.     Marland Kitchen loratadine (CLARITIN) 10 MG tablet Take 10 mg by mouth daily.    . Multiple Vitamin (MULTIVITAMIN WITH MINERALS) TABS tablet Take 1 tablet by mouth daily.    . Omega-3 Fatty Acids (FISH OIL TRIPLE STRENGTH) 1400 MG CAPS Take 1 capsule by mouth daily.    . tamsulosin (FLOMAX) 0.4 MG CAPS capsule Take 0.4 mg by mouth daily.     . vitamin B-12 (CYANOCOBALAMIN) 500 MCG tablet Take 500 mcg by mouth daily.     No current facility-administered medications on file prior to visit.    There are no Patient Instructions on file for this visit. No follow-ups on file.   Kris Hartmann, NP

## 2020-10-31 ENCOUNTER — Other Ambulatory Visit: Payer: Self-pay

## 2020-10-31 ENCOUNTER — Ambulatory Visit (INDEPENDENT_AMBULATORY_CARE_PROVIDER_SITE_OTHER): Payer: Medicare Other | Admitting: Nurse Practitioner

## 2020-10-31 VITALS — BP 160/91 | HR 98 | Ht 74.0 in | Wt 221.0 lb

## 2020-10-31 DIAGNOSIS — I890 Lymphedema, not elsewhere classified: Secondary | ICD-10-CM | POA: Diagnosis not present

## 2020-10-31 NOTE — Progress Notes (Signed)
History of Present Illness  There is no documented history at this time  Assessments & Plan   There are no diagnoses linked to this encounter.    Additional instructions  Subjective:  Patient presents with venous ulcer of the Bilateral lower extremity.    Procedure:  3 layer unna wrap was placed Bilateral lower extremity.   Plan:   Follow up in one week.  

## 2020-11-02 ENCOUNTER — Encounter (INDEPENDENT_AMBULATORY_CARE_PROVIDER_SITE_OTHER): Payer: Self-pay | Admitting: Nurse Practitioner

## 2020-11-07 ENCOUNTER — Other Ambulatory Visit: Payer: Self-pay

## 2020-11-07 ENCOUNTER — Ambulatory Visit (INDEPENDENT_AMBULATORY_CARE_PROVIDER_SITE_OTHER): Payer: Medicare Other | Admitting: Nurse Practitioner

## 2020-11-07 ENCOUNTER — Encounter (INDEPENDENT_AMBULATORY_CARE_PROVIDER_SITE_OTHER): Payer: Medicare Other

## 2020-11-07 VITALS — BP 154/89 | HR 98 | Ht 74.0 in | Wt 225.0 lb

## 2020-11-07 DIAGNOSIS — I83009 Varicose veins of unspecified lower extremity with ulcer of unspecified site: Secondary | ICD-10-CM

## 2020-11-07 DIAGNOSIS — L97909 Non-pressure chronic ulcer of unspecified part of unspecified lower leg with unspecified severity: Secondary | ICD-10-CM | POA: Diagnosis not present

## 2020-11-07 NOTE — Progress Notes (Signed)
History of Present Illness  There is no documented history at this time  Assessments & Plan   There are no diagnoses linked to this encounter.    Additional instructions  Subjective:  Patient presents with venous ulcer of the Bilateral lower extremity.    Procedure:  3 layer unna wrap was placed Bilateral lower extremity.   Plan:   Follow up in one week.  

## 2020-11-12 ENCOUNTER — Encounter (INDEPENDENT_AMBULATORY_CARE_PROVIDER_SITE_OTHER): Payer: Medicare Other

## 2020-11-14 ENCOUNTER — Encounter (INDEPENDENT_AMBULATORY_CARE_PROVIDER_SITE_OTHER): Payer: Self-pay | Admitting: Nurse Practitioner

## 2020-11-21 ENCOUNTER — Ambulatory Visit (INDEPENDENT_AMBULATORY_CARE_PROVIDER_SITE_OTHER): Payer: Medicare Other | Admitting: Nurse Practitioner

## 2020-11-21 ENCOUNTER — Other Ambulatory Visit: Payer: Self-pay

## 2020-11-21 ENCOUNTER — Encounter (INDEPENDENT_AMBULATORY_CARE_PROVIDER_SITE_OTHER): Payer: Self-pay

## 2020-11-21 VITALS — BP 155/82 | HR 98 | Resp 16 | Wt 222.0 lb

## 2020-11-21 DIAGNOSIS — I83009 Varicose veins of unspecified lower extremity with ulcer of unspecified site: Secondary | ICD-10-CM

## 2020-11-21 DIAGNOSIS — L97909 Non-pressure chronic ulcer of unspecified part of unspecified lower leg with unspecified severity: Secondary | ICD-10-CM

## 2020-11-21 NOTE — Progress Notes (Signed)
History of Present Illness  There is no documented history at this time  Assessments & Plan   There are no diagnoses linked to this encounter.    Additional instructions  Subjective:  Patient presents with venous ulcer of the Bilateral lower extremity.    Procedure:  3 layer unna wrap was placed Bilateral lower extremity.   Plan:   Follow up in one week.  

## 2020-11-24 ENCOUNTER — Ambulatory Visit (INDEPENDENT_AMBULATORY_CARE_PROVIDER_SITE_OTHER): Payer: Medicare Other | Admitting: Podiatry

## 2020-11-24 ENCOUNTER — Encounter: Payer: Self-pay | Admitting: Podiatry

## 2020-11-24 ENCOUNTER — Other Ambulatory Visit: Payer: Self-pay

## 2020-11-24 ENCOUNTER — Encounter (INDEPENDENT_AMBULATORY_CARE_PROVIDER_SITE_OTHER): Payer: Self-pay | Admitting: Nurse Practitioner

## 2020-11-24 DIAGNOSIS — D689 Coagulation defect, unspecified: Secondary | ICD-10-CM | POA: Diagnosis not present

## 2020-11-24 DIAGNOSIS — I739 Peripheral vascular disease, unspecified: Secondary | ICD-10-CM

## 2020-11-24 DIAGNOSIS — B351 Tinea unguium: Secondary | ICD-10-CM | POA: Diagnosis not present

## 2020-11-24 DIAGNOSIS — N183 Chronic kidney disease, stage 3 unspecified: Secondary | ICD-10-CM

## 2020-11-24 DIAGNOSIS — E1122 Type 2 diabetes mellitus with diabetic chronic kidney disease: Secondary | ICD-10-CM

## 2020-11-24 DIAGNOSIS — M79675 Pain in left toe(s): Secondary | ICD-10-CM

## 2020-11-24 DIAGNOSIS — E119 Type 2 diabetes mellitus without complications: Secondary | ICD-10-CM

## 2020-11-24 DIAGNOSIS — M79674 Pain in right toe(s): Secondary | ICD-10-CM

## 2020-11-24 NOTE — Progress Notes (Signed)
This patient returns to my office for at risk foot care.  This patient requires this care by a professional since this patient will be at risk due to having diagnosis of pvd, coagulation defect  and CKD stage 3. He is taking plavix.   This patient is wearing unna boots both feet/legs.  This patient is unable to cut nails himself since the patient cannot reach his nails.These nails are painful walking and wearing shoes.  This patient presents for at risk foot care today.  General Appearance  Alert, conversant and in no acute stress.  Vascular  Deferred due to unna boots.  Neurologic  Deferred due to unna boots  Nails Thick disfigured discolored nails with subungual debris  from hallux to fifth toes bilaterally. No evidence of bacterial infection or drainage bilaterally.  Orthopedic  No limitations of motion  feet .  No crepitus or effusions noted.  No bony pathology or digital deformities noted.  Skin  normotropic skin with no porokeratosis noted bilaterally.  No signs of infections or ulcers noted.     Onychomycosis  Pain in right toes  Pain in left toes  Consent was obtained for treatment procedures.   Mechanical debridement of nails 1-5  bilaterally performed with a nail nipper.  Filed with dremel without incident.    Return office visit  3 months                    Told patient to return for periodic foot care and evaluation due to potential at risk complications.   Gardiner Barefoot DPM

## 2020-11-28 ENCOUNTER — Ambulatory Visit (INDEPENDENT_AMBULATORY_CARE_PROVIDER_SITE_OTHER): Payer: Medicare Other | Admitting: Nurse Practitioner

## 2020-11-28 ENCOUNTER — Other Ambulatory Visit: Payer: Self-pay

## 2020-11-28 ENCOUNTER — Encounter (INDEPENDENT_AMBULATORY_CARE_PROVIDER_SITE_OTHER): Payer: Self-pay

## 2020-11-28 VITALS — BP 147/81 | HR 101 | Resp 16

## 2020-11-28 DIAGNOSIS — L97909 Non-pressure chronic ulcer of unspecified part of unspecified lower leg with unspecified severity: Secondary | ICD-10-CM

## 2020-11-28 DIAGNOSIS — I83009 Varicose veins of unspecified lower extremity with ulcer of unspecified site: Secondary | ICD-10-CM

## 2020-11-28 NOTE — Progress Notes (Signed)
History of Present Illness  There is no documented history at this time  Assessments & Plan   There are no diagnoses linked to this encounter.    Additional instructions  Subjective:  Patient presents with venous ulcer of the Bilateral lower extremity.    Procedure:  3 layer unna wrap was placed Bilateral lower extremity.   Plan:   Follow up in one week.  

## 2020-11-30 ENCOUNTER — Encounter (INDEPENDENT_AMBULATORY_CARE_PROVIDER_SITE_OTHER): Payer: Self-pay | Admitting: Nurse Practitioner

## 2020-12-04 ENCOUNTER — Ambulatory Visit (INDEPENDENT_AMBULATORY_CARE_PROVIDER_SITE_OTHER): Payer: Medicare Other | Admitting: Vascular Surgery

## 2020-12-04 ENCOUNTER — Other Ambulatory Visit: Payer: Self-pay

## 2020-12-04 ENCOUNTER — Encounter (INDEPENDENT_AMBULATORY_CARE_PROVIDER_SITE_OTHER): Payer: Self-pay | Admitting: Vascular Surgery

## 2020-12-04 VITALS — BP 160/78 | HR 99 | Ht 74.0 in | Wt 224.0 lb

## 2020-12-04 DIAGNOSIS — E119 Type 2 diabetes mellitus without complications: Secondary | ICD-10-CM

## 2020-12-04 DIAGNOSIS — E785 Hyperlipidemia, unspecified: Secondary | ICD-10-CM | POA: Diagnosis not present

## 2020-12-04 DIAGNOSIS — I10 Essential (primary) hypertension: Secondary | ICD-10-CM | POA: Diagnosis not present

## 2020-12-04 DIAGNOSIS — I872 Venous insufficiency (chronic) (peripheral): Secondary | ICD-10-CM

## 2020-12-05 ENCOUNTER — Ambulatory Visit (INDEPENDENT_AMBULATORY_CARE_PROVIDER_SITE_OTHER): Payer: Medicare Other | Admitting: Nurse Practitioner

## 2020-12-06 ENCOUNTER — Encounter (INDEPENDENT_AMBULATORY_CARE_PROVIDER_SITE_OTHER): Payer: Self-pay | Admitting: Vascular Surgery

## 2020-12-06 DIAGNOSIS — I872 Venous insufficiency (chronic) (peripheral): Secondary | ICD-10-CM | POA: Insufficient documentation

## 2020-12-06 HISTORY — DX: Venous insufficiency (chronic) (peripheral): I87.2

## 2020-12-06 NOTE — Progress Notes (Signed)
MRN : [id number]  Grant Mcdaniel is a 79 y.o. (09/14/1941) male who presents with chief complaint of  Chief Complaint  Patient presents with  . Follow-up    Bil unna boot  .  History of Present Illness:   The patient returns to the office for followup evaluation regarding leg swelling.  The swelling has improved quite a bit and the pain associated with swelling has decreased substantially. There have not been any interval development of a ulcerations or wounds.  Since the previous visit the patient has been wearing graduated compression stockings and has noted little significant improvement in the lymphedema. The patient has been using compression routinely morning until night.  The patient also states elevation during the day and exercise is being done too.     Current Meds  Medication Sig  . aspirin EC 81 MG tablet Take 81 mg by mouth at bedtime.   Marland Kitchen atorvastatin (LIPITOR) 40 MG tablet Take 40 mg by mouth at bedtime.   . benazepril (LOTENSIN) 40 MG tablet Take 1 tablet (40 mg total) by mouth daily.  . bisoprolol (ZEBETA) 10 MG tablet Take 1 tablet (10 mg total) by mouth daily.  . Cholecalciferol 25 MCG (1000 UT) capsule Take 1,000 Units by mouth daily.   . clopidogrel (PLAVIX) 75 MG tablet Take 75 mg by mouth daily.  . Coenzyme Q10 10 MG capsule Take 10 mg by mouth daily.   . fluticasone (FLONASE) 50 MCG/ACT nasal spray Place 1 spray into both nostrils daily.  Marland Kitchen levothyroxine (SYNTHROID) 75 MCG tablet Take 75 mcg by mouth daily before breakfast.   . linagliptin (TRADJENTA) 5 MG TABS tablet Take 5 mg by mouth daily.   Marland Kitchen loratadine (CLARITIN) 10 MG tablet Take 10 mg by mouth daily.  . Multiple Vitamin (MULTIVITAMIN WITH MINERALS) TABS tablet Take 1 tablet by mouth daily.  . Omega-3 Fatty Acids (FISH OIL TRIPLE STRENGTH) 1400 MG CAPS Take 1 capsule by mouth daily.  . tamsulosin (FLOMAX) 0.4 MG CAPS capsule Take 0.4 mg by mouth daily.   . vitamin B-12 (CYANOCOBALAMIN) 500 MCG  tablet Take 500 mcg by mouth daily.    Past Medical History:  Diagnosis Date  . CAD (coronary artery disease)   . CKD (chronic kidney disease) stage 3, GFR 30-59 ml/min (HCC)   . Diabetes mellitus without complication ([place])   . Hypercholesterolemia   . Hypertension   . Hypertension   . Hypothyroid   . PVD (peripheral vascular disease) ([place])   . Venous insufficiency     Past Surgical History:  Procedure Laterality Date  . galbladder    . HIP ARTHROPLASTY Right 12/28/2019   Procedure: ARTHROPLASTY BIPOLAR HIP (HEMIARTHROPLASTY);  Surgeon: Thornton Park, MD;  Location: [facility] ORS;  Service: Orthopedics;  Laterality: Right;    Social History Social History   Tobacco Use  . Smoking status: Never Smoker  . Smokeless tobacco: Never Used  Substance Use Topics  . Alcohol use: Never  . Drug use: Never    Family History Family History  Problem Relation Age of Onset  . Cancer Mother   . CAD Father     Allergies  Allergen Reactions  . Sulfa Antibiotics Nausea And Vomiting     REVIEW OF SYSTEMS (Negative unless checked)  Constitutional: [] Weight loss  [] Fever  [] Chills Cardiac: [] Chest pain   [] Chest pressure   [] Palpitations   [] Shortness of breath when laying flat   [] Shortness of breath with exertion. Vascular:  [] Pain in legs  with walking   [] Pain in legs at rest  [] History of DVT   [] Phlebitis   [x] Swelling in legs   [x] Varicose veins   [] Non-healing ulcers Pulmonary:   [] Uses home oxygen   [] Productive cough   [] Hemoptysis   [] Wheeze  [] COPD   [] Asthma Neurologic:  [] Dizziness   [] Seizures   [] History of stroke   [] History of TIA  [] Aphasia   [] Vissual changes   [] Weakness or numbness in arm   [] Weakness or numbness in leg Musculoskeletal:   [] Joint swelling   [x] Joint pain   [] Low back pain Hematologic:  [] Easy bruising  [] Easy bleeding   [x] Hypercoagulable state   [] Anemic Gastrointestinal:  [] Diarrhea   [] Vomiting  [] Gastroesophageal reflux/heartburn   [] Difficulty  swallowing. Genitourinary:  [] Chronic kidney disease   [] Difficult urination  [] Frequent urination   [] Blood in urine Skin:  [] Rashes   [] Ulcers  Psychological:  [] History of anxiety   []  History of major depression.  Physical Examination  Vitals:   12/04/20 1432  BP: (!) 160/78  Pulse: 99  Weight: 224 lb (101.6 kg)  Height: 6\' 2"  (1.88 m)   Body mass index is 28.76 kg/m. Gen: WD/WN, NAD Head: [place]/AT, No temporalis wasting.  Ear/Nose/Throat: Hearing grossly intact, nares w/o erythema or drainage Eyes: PER, EOMI, sclera nonicteric.  Neck: Supple, no large masses.   Pulmonary:  Good air movement, no audible wheezing bilaterally, no use of accessory muscles.  Cardiac: RRR, no JVD Vascular: scattered varicosities present bilaterally.  Moderate to severe venous stasis changes to the legs bilaterally.  1-2+ soft pitting edema Vessel Right Left  Radial Palpable Palpable  Gastrointestinal: Non-distended. No guarding/no peritoneal signs.  Musculoskeletal: M/S 5/5 throughout.  No deformity or atrophy.  Neurologic: CN 2-12 intact. Symmetrical.  Speech is fluent. Motor exam as listed above. Psychiatric: Judgment intact, Mood & affect appropriate for pt's clinical situation. Dermatologic: Moderate to severe venous stasis changes ulcers are healed.  No changes consistent with cellulitis.  CBC Lab Results  Component Value Date   WBC 11.2 (H) 01/06/2020   HGB 8.6 (L) 01/06/2020   HCT 27.1 (L) 01/06/2020   MCV 89.1 01/06/2020   PLT 205 01/06/2020    BMET    Component Value Date/Time   NA 139 01/06/2020 0521   K 4.0 01/06/2020 0521   CL 106 01/06/2020 0521   CO2 25 01/06/2020 0521   GLUCOSE 142 (H) 01/06/2020 0521   BUN 30 (H) 01/06/2020 0521   CREATININE 1.21 01/06/2020 0521   CALCIUM 10.3 01/06/2020 0521   GFRNONAA 57 (L) 01/06/2020 0521   GFRAA >60 01/06/2020 0521   CrCl cannot be calculated (Patient's most recent lab result is older than the maximum 21 days  allowed.).  COAG Lab Results  Component Value Date   INR 1.2 01/02/2020    Radiology No results found.   Assessment/Plan 1. Chronic venous insufficiency No surgery or intervention at this point in time.    I have had a long discussion with the patient regarding venous insufficiency and why it  causes symptoms. I have discussed with the patient the chronic skin changes that accompany venous insufficiency and the long term sequela such as infection and ulceration.  Patient will begin wearing graduated compression stockings class 1 (20-30 mmHg) or compression wraps on a daily basis a prescription was given. The patient will put the stockings on first thing in the morning and removing them in the evening. The patient is instructed specifically not to sleep in the stockings.  In addition, behavioral modification including several periods of elevation of the lower extremities during the day will be continued. I have demonstrated that proper elevation is a position with the ankles at heart level.  The patient is instructed to begin routine exercise, especially walking on a daily basis  2. Essential hypertension Continue antihypertensive medications as already ordered, these medications have been reviewed and there are no changes at this time.   3. Type 2 diabetes mellitus without complication, without long-term current use of insulin (HCC) Continue hypoglycemic medications as already ordered, these medications have been reviewed and there are no changes at this time.  Hgb A1C to be monitored as already arranged by primary service   4. Hyperlipidemia, unspecified hyperlipidemia type Continue statin as ordered and reviewed, no changes at this time     Hortencia Pilar, MD  12/06/2020 4:07 PM

## 2020-12-29 ENCOUNTER — Ambulatory Visit (INDEPENDENT_AMBULATORY_CARE_PROVIDER_SITE_OTHER): Payer: Medicare Other | Admitting: Cardiology

## 2020-12-29 ENCOUNTER — Other Ambulatory Visit: Payer: Self-pay

## 2020-12-29 ENCOUNTER — Other Ambulatory Visit
Admission: RE | Admit: 2020-12-29 | Discharge: 2020-12-29 | Disposition: A | Discharge status: Home / Self Care | Payer: Medicare Other | Source: Ambulatory Visit | Attending: Cardiology | Admitting: Cardiology

## 2020-12-29 ENCOUNTER — Encounter: Payer: Self-pay | Admitting: Cardiology

## 2020-12-29 VITALS — BP 152/80 | HR 73 | Ht 74.0 in | Wt 220.0 lb

## 2020-12-29 DIAGNOSIS — R600 Localized edema: Secondary | ICD-10-CM | POA: Diagnosis not present

## 2020-12-29 DIAGNOSIS — I10 Essential (primary) hypertension: Secondary | ICD-10-CM | POA: Diagnosis present

## 2020-12-29 DIAGNOSIS — I2510 Atherosclerotic heart disease of native coronary artery without angina pectoris: Secondary | ICD-10-CM | POA: Diagnosis not present

## 2020-12-29 LAB — BASIC METABOLIC PANEL
Anion gap: 15 (ref 5–15)
BUN: 32 mg/dL — ABNORMAL HIGH (ref 8–23)
CO2: 31 mmol/L (ref 22–32)
Calcium: 10.7 mg/dL — ABNORMAL HIGH (ref 8.9–10.3)
Chloride: 97 mmol/L — ABNORMAL LOW (ref 98–111)
Creatinine, Ser: 1.96 mg/dL — ABNORMAL HIGH (ref 0.61–1.24)
GFR, Estimated: 34 mL/min — ABNORMAL LOW (ref >60)
Glucose, Bld: 120 mg/dL — ABNORMAL HIGH (ref 70–99)
Potassium: 4.6 mmol/L (ref 3.5–5.1)
Sodium: 143 mmol/L (ref 135–145)

## 2020-12-29 MED ORDER — AMLODIPINE BESYLATE 2.5 MG PO TABS: 2.5000 mg | ORAL_TABLET | Freq: Every day | ORAL | 5 refills | Status: DC

## 2020-12-29 MED ORDER — FUROSEMIDE 40 MG PO TABS: 40.0000 mg | ORAL_TABLET | ORAL | 5 refills | Status: DC | PRN

## 2020-12-29 NOTE — Patient Instructions (Addendum)
Medication Instructions:  START amlodipine 2.5mg  daily.TAKE Lasix (furosemide) only as needed.  *If you need a refill on your cardiac medications before your next appointment, please call your pharmacy*   Lab Work:  Repeat BMP in 3 months.  - Please go to the [facility]. You will check in at the front desk to the right as you walk into the atrium. Valet Parking is offered if needed. - No appointment needed. You may go any day between 7 am and 6 pm.   If you have labs (blood work) drawn today and your tests are completely normal, you will receive your results only by: Marland Kitchen MyChart Message (if you have MyChart) OR . A paper copy in the mail If you have any lab test that is abnormal or we need to change your treatment, we will call you to review the results.   Testing/Procedures: None ordered today   Follow-Up: At [facility], you and your health needs are our priority.  As part of our continuing mission to provide you with exceptional heart care, we have created designated Provider Care Teams.  These Care Teams include your primary Cardiologist (physician) and Advanced Practice Providers (APPs -  Physician Assistants and Nurse Practitioners) who all work together to provide you with the care you need, when you need it.  We recommend signing up for the patient portal called "MyChart".  Sign up information is provided on this After Visit Summary.  MyChart is used to connect with patients for Virtual Visits (Telemedicine).  Patients are able to view lab/test results, encounter notes, upcoming appointments, etc.  Non-urgent messages can be sent to your provider as well.   To learn more about what you can do with MyChart, go to [email or web address].    Your next appointment:   6 month(s)  The format for your next appointment:   In Person  Provider:   You may see Kate Sable, MD or one of the following Advanced Practice Providers on your designated Care Team:     Murray Hodgkins, NP  Christell Faith, PA-C  Marrianne Mood, PA-C  Cadence [place], [place]  Laurann Montana, NP

## 2020-12-29 NOTE — Progress Notes (Signed)
Cardiology Office Note:    Date:  12/29/2020   ID:  Grant Mcdaniel, DOB [birth year], MRN [id number]  PCP:  Marguerita Merles, MD  [facility] Cardiologist:  Kate Sable, MD  [place] Electrophysiologist:  None   Referring MD: Marguerita Merles, MD   Chief Complaint  Patient presents with  . Follow-up    No new cardiac concerns    History of Present Illness:    Grant Mcdaniel is a 80 y.o. male with a hx of CAD (PCI/DES to [place] in 2004 at [facility]) hypertension, CKD-III, diabetes, lymphedema, who presents for follow-up.  Last seen due to edema.    He was started on Lasix 40 mg daily to help with edema.  Amlodipine was stopped in case this was contributing.  He states his edema is improved, now resolved.  Denies any shortness of breath.  States being a little wobbly on his legs.  Prior notes Echocardiogram on 12/2019 showed normal systolic and diastolic function, EF 55 to 60%. Unna boots, wraps have been used to help with patient's symptoms.  This has not been very successful.  Worsening edema around the knees when noted.  There is need for diuretic therapy/Lasix to help with patient's edema.  He denies any chest pain or shortness of breath since his stent placement in 2004.  Compliant aspirin, Plavix, statin.  Past Medical History:  Diagnosis Date  . CAD (coronary artery disease)   . CKD (chronic kidney disease) stage 3, GFR 30-59 ml/min (HCC)   . Diabetes mellitus without complication ([place])   . Hypercholesterolemia   . Hypertension   . Hypertension   . Hypothyroid   . PVD (peripheral vascular disease) ([place])   . Venous insufficiency     Past Surgical History:  Procedure Laterality Date  . galbladder    . HIP ARTHROPLASTY Right 12/28/2019   Procedure: ARTHROPLASTY BIPOLAR HIP (HEMIARTHROPLASTY);  Surgeon: Thornton Park, MD;  Location: [facility] ORS;  Service: Orthopedics;  Laterality: Right;    Current Medications: Current Meds  Medication Sig  . amLODipine (NORVASC)  2.5 MG tablet Take 1 tablet (2.5 mg total) by mouth daily.  Marland Kitchen aspirin EC 81 MG tablet Take 81 mg by mouth at bedtime.   Marland Kitchen atorvastatin (LIPITOR) 40 MG tablet Take 40 mg by mouth at bedtime.   . benazepril (LOTENSIN) 40 MG tablet Take 1 tablet (40 mg total) by mouth daily.  . bisoprolol (ZEBETA) 10 MG tablet Take 1 tablet (10 mg total) by mouth daily.  . Cholecalciferol 25 MCG (1000 UT) capsule Take 1,000 Units by mouth daily.   . clopidogrel (PLAVIX) 75 MG tablet Take 75 mg by mouth daily.  . Coenzyme Q10 10 MG capsule Take 10 mg by mouth daily.   . fluticasone (FLONASE) 50 MCG/ACT nasal spray Place 1 spray into both nostrils daily.  Marland Kitchen levothyroxine (SYNTHROID) 75 MCG tablet Take 75 mcg by mouth daily before breakfast.   . linagliptin (TRADJENTA) 5 MG TABS tablet Take 5 mg by mouth daily.   Marland Kitchen loratadine (CLARITIN) 10 MG tablet Take 10 mg by mouth daily.  . Multiple Vitamin (MULTIVITAMIN WITH MINERALS) TABS tablet Take 1 tablet by mouth daily.  . Omega-3 Fatty Acids (FISH OIL TRIPLE STRENGTH) 1400 MG CAPS Take 1 capsule by mouth daily.  . tamsulosin (FLOMAX) 0.4 MG CAPS capsule Take 0.4 mg by mouth daily.   . vitamin B-12 (CYANOCOBALAMIN) 500 MCG tablet Take 500 mcg by mouth daily.  . [DISCONTINUED] furosemide (LASIX) 40 MG  tablet Take 1 tablet (40 mg total) by mouth daily.     Allergies:   Sulfa antibiotics   Social History   Socioeconomic History  . Marital status: Married    Spouse name: Not on file  . Number of children: Not on file  . Years of education: Not on file  . Highest education level: Not on file  Occupational History  . Not on file  Tobacco Use  . Smoking status: Never Smoker  . Smokeless tobacco: Never Used  Vaping Use  . Vaping Use: Never used  Substance and Sexual Activity  . Alcohol use: Never  . Drug use: Never  . Sexual activity: Not on file  Other Topics Concern  . Not on file  Social History Narrative  . Not on file   Social Determinants of Health    Financial Resource Strain: Not on file  Food Insecurity: Not on file  Transportation Needs: Not on file  Physical Activity: Not on file  Stress: Not on file  Social Connections: Not on file     Family History: The patient's family history includes CAD in his father; Cancer in his mother; Hypertension in his brother.  ROS:   Please see the history of present illness.     All other systems reviewed and are negative.  EKGs/Labs/Other Studies Reviewed:    The following studies were reviewed today:   EKG:  EKG not ordered today.   Recent Labs: 01/04/2020: ALT 36 01/05/2020: Magnesium 2.1 01/06/2020: Hemoglobin 8.6; Platelets 205 12/29/2020: BUN 32; Creatinine, Ser 1.96; Potassium 4.6; Sodium 143  Recent Lipid Panel    Component Value Date/Time   CHOL 113 12/30/2019 0341   TRIG 129 12/30/2019 0341   HDL 30 (L) 12/30/2019 0341   CHOLHDL 3.8 12/30/2019 0341   VLDL 26 12/30/2019 0341   LDLCALC 57 12/30/2019 0341    Physical Exam:    VS:  BP (!) 152/80 (BP Location: Right Arm, Patient Position: Sitting, Cuff Size: Large)   Pulse 73   Ht 6\' 2"  (1.88 m)   Wt 220 lb (99.8 kg)   SpO2 96%   BMI 28.25 kg/m     Wt Readings from Last 3 Encounters:  12/29/20 220 lb (99.8 kg)  12/04/20 224 lb (101.6 kg)  11/21/20 222 lb (100.7 kg)     GEN:  Well nourished, well developed in no acute distress HEENT: Normal NECK: No JVD; No carotid bruits LYMPHATICS: No lymphadenopathy CARDIAC: RRR, no murmurs, rubs, gallops RESPIRATORY:  Clear to auscultation without rales, wheezing or rhonchi  ABDOMEN: Soft, non-tender, non-distended MUSCULOSKELETAL:  no edema around the knees SKIN: Warm and dry NEUROLOGIC:  Alert and oriented x 3 PSYCHIATRIC:  Normal affect   ASSESSMENT:    1. Coronary artery disease involving native coronary artery of native heart without angina pectoris   2. Edema leg   3. Primary hypertension    PLAN:    In order of problems listed above:  1. CAD s/p PCI  with DES to first obtuse marginal 2004.  Currently denies symptoms of chest pain.  Continue aspirin, Plavix, Lipitor.  Echo 06/4080 normal systolic and diastolic function, EF 55 to 60%. 2. Lower extremity edema currently resolved.  BMP with creatinine at 1.96, worse compared to last.  Take Lasix only as needed.  Repeat BMP in 3 months. 3. History of hypertension, BP elevated.  Start amlodipine 2.5 mg daily.  Continue benazepril 40 mg, bisoprolol daily.   Follow-up in 6 months.  Total encounter  time 40 minutes  Greater than 50% was spent in counseling and coordination of care with the patient   Medication Adjustments/Labs and Tests Ordered: Current medicines are reviewed at length with the patient today.  Concerns regarding medicines are outlined above.  Orders Placed This Encounter  Procedures  . Basic metabolic panel   Meds ordered this encounter  Medications  . amLODipine (NORVASC) 2.5 MG tablet    Sig: Take 1 tablet (2.5 mg total) by mouth daily.    Dispense:  30 tablet    Refill:  5  . furosemide (LASIX) 40 MG tablet    Sig: Take 1 tablet (40 mg total) by mouth as needed.    Dispense:  30 tablet    Refill:  5    Patient Instructions  Medication Instructions:  START amlodipine 2.5mg  daily.TAKE Lasix (furosemide) only as needed.  *If you need a refill on your cardiac medications before your next appointment, please call your pharmacy*   Lab Work:  Repeat BMP in 3 months.  - Please go to the [facility]. You will check in at the front desk to the right as you walk into the atrium. Valet Parking is offered if needed. - No appointment needed. You may go any day between 7 am and 6 pm.   If you have labs (blood work) drawn today and your tests are completely normal, you will receive your results only by: Marland Kitchen MyChart Message (if you have MyChart) OR . A paper copy in the mail If you have any lab test that is abnormal or we need to change your treatment, we will call you  to review the results.   Testing/Procedures: None ordered today   Follow-Up: At [facility], you and your health needs are our priority.  As part of our continuing mission to provide you with exceptional heart care, we have created designated Provider Care Teams.  These Care Teams include your primary Cardiologist (physician) and Advanced Practice Providers (APPs -  Physician Assistants and Nurse Practitioners) who all work together to provide you with the care you need, when you need it.  We recommend signing up for the patient portal called "MyChart".  Sign up information is provided on this After Visit Summary.  MyChart is used to connect with patients for Virtual Visits (Telemedicine).  Patients are able to view lab/test results, encounter notes, upcoming appointments, etc.  Non-urgent messages can be sent to your provider as well.   To learn more about what you can do with MyChart, go to [email or web address].    Your next appointment:   6 month(s)  The format for your next appointment:   In Person  Provider:   You may see Kate Sable, MD or one of the following Advanced Practice Providers on your designated Care Team:    Murray Hodgkins, NP  Christell Faith, PA-C  Marrianne Mood, PA-C  Cadence [place], [place]  Laurann Montana, NP      Signed, Kate Sable, MD  12/29/2020 2:59 PM    [place]

## 2021-02-23 ENCOUNTER — Ambulatory Visit: Payer: Medicare Other | Admitting: Podiatry

## 2021-02-27 ENCOUNTER — Other Ambulatory Visit
Admission: RE | Admit: 2021-02-27 | Discharge: 2021-02-27 | Disposition: A | Discharge status: Home / Self Care | Payer: Medicare Other | Attending: Cardiology | Admitting: Cardiology

## 2021-02-27 DIAGNOSIS — I2510 Atherosclerotic heart disease of native coronary artery without angina pectoris: Secondary | ICD-10-CM | POA: Diagnosis present

## 2021-02-27 LAB — BASIC METABOLIC PANEL
Anion gap: 10 (ref 5–15)
BUN: 36 mg/dL — ABNORMAL HIGH (ref 8–23)
CO2: 26 mmol/L (ref 22–32)
Calcium: 10.1 mg/dL (ref 8.9–10.3)
Chloride: 101 mmol/L (ref 98–111)
Creatinine, Ser: 1.7 mg/dL — ABNORMAL HIGH (ref 0.61–1.24)
GFR, Estimated: 41 mL/min — ABNORMAL LOW (ref >60)
Glucose, Bld: 129 mg/dL — ABNORMAL HIGH (ref 70–99)
Potassium: 4.6 mmol/L (ref 3.5–5.1)
Sodium: 137 mmol/L (ref 135–145)

## 2021-03-02 ENCOUNTER — Telehealth: Payer: Self-pay

## 2021-03-02 NOTE — Telephone Encounter (Signed)
Called and Left a VM for patient to call back for the following BMP results.  Creatinine/kidney function improving. Continue to take Lasix only as needed.

## 2021-03-03 NOTE — Telephone Encounter (Signed)
Called and [facility] for patient to call back. Tried mobile number as well and it does not have a VM set up.

## 2021-03-16 ENCOUNTER — Encounter: Payer: Self-pay | Admitting: Podiatry

## 2021-03-16 ENCOUNTER — Other Ambulatory Visit: Payer: Self-pay

## 2021-03-16 ENCOUNTER — Ambulatory Visit (INDEPENDENT_AMBULATORY_CARE_PROVIDER_SITE_OTHER): Payer: Medicare Other | Admitting: Podiatry

## 2021-03-16 DIAGNOSIS — M79674 Pain in right toe(s): Secondary | ICD-10-CM

## 2021-03-16 DIAGNOSIS — E1122 Type 2 diabetes mellitus with diabetic chronic kidney disease: Secondary | ICD-10-CM | POA: Diagnosis not present

## 2021-03-16 DIAGNOSIS — B351 Tinea unguium: Secondary | ICD-10-CM

## 2021-03-16 DIAGNOSIS — E119 Type 2 diabetes mellitus without complications: Secondary | ICD-10-CM | POA: Diagnosis not present

## 2021-03-16 DIAGNOSIS — D689 Coagulation defect, unspecified: Secondary | ICD-10-CM | POA: Diagnosis not present

## 2021-03-16 DIAGNOSIS — N183 Chronic kidney disease, stage 3 unspecified: Secondary | ICD-10-CM

## 2021-03-16 DIAGNOSIS — M79675 Pain in left toe(s): Secondary | ICD-10-CM

## 2021-03-16 DIAGNOSIS — I739 Peripheral vascular disease, unspecified: Secondary | ICD-10-CM

## 2021-03-16 NOTE — Progress Notes (Signed)
This patient returns to my office for at risk foot care.  This patient requires this care by a professional since this patient will be at risk due to having diagnosis of pvd, coagulation defect  and CKD stage 3. He is taking plavix.     This patient is unable to cut nails himself since the patient cannot reach his nails.These nails are painful walking and wearing shoes.  This patient presents for at risk foot care today.  General Appearance  Alert, conversant and in no acute stress.  Vascular  Dorsalis pedis and posterior tibial pulses are not palpable  B/L.  Swollen feet  B/L.  Absent digital hair.  Neurologic  Diminished  LOPS  B/L.  Nails Thick disfigured discolored nails with subungual debris  from hallux to fifth toes bilaterally. No evidence of bacterial infection or drainage bilaterally.  Orthopedic  No limitations of motion  feet .  No crepitus or effusions noted.  No bony pathology or digital deformities noted.  Skin  normotropic skin with no porokeratosis noted bilaterally.  No signs of infections or ulcers noted.     Onychomycosis  Pain in right toes  Pain in left toes  Consent was obtained for treatment procedures.   Mechanical debridement of nails 1-5  bilaterally performed with a nail nipper.  Filed with dremel without incident.    Return office visit  3 months                    Told patient to return for periodic foot care and evaluation due to potential at risk complications.   Grant Mcdaniel DPM  

## 2021-03-18 NOTE — Telephone Encounter (Signed)
Patient was called and given result note on 03/05/21.

## 2021-04-08 ENCOUNTER — Other Ambulatory Visit: Payer: Self-pay

## 2021-04-08 MED ORDER — BENAZEPRIL HCL 40 MG PO TABS: 40.0000 mg | ORAL_TABLET | Freq: Every day | ORAL | 5 refills | Status: DC

## 2021-06-18 ENCOUNTER — Encounter: Payer: Self-pay | Admitting: Podiatry

## 2021-06-18 ENCOUNTER — Ambulatory Visit (INDEPENDENT_AMBULATORY_CARE_PROVIDER_SITE_OTHER): Payer: Medicare Other | Admitting: Podiatry

## 2021-06-18 ENCOUNTER — Other Ambulatory Visit: Payer: Self-pay

## 2021-06-18 DIAGNOSIS — N183 Chronic kidney disease, stage 3 unspecified: Secondary | ICD-10-CM

## 2021-06-18 DIAGNOSIS — I739 Peripheral vascular disease, unspecified: Secondary | ICD-10-CM

## 2021-06-18 DIAGNOSIS — E119 Type 2 diabetes mellitus without complications: Secondary | ICD-10-CM

## 2021-06-18 DIAGNOSIS — E1122 Type 2 diabetes mellitus with diabetic chronic kidney disease: Secondary | ICD-10-CM

## 2021-06-18 DIAGNOSIS — B351 Tinea unguium: Secondary | ICD-10-CM | POA: Diagnosis not present

## 2021-06-18 DIAGNOSIS — D689 Coagulation defect, unspecified: Secondary | ICD-10-CM

## 2021-06-18 DIAGNOSIS — M79675 Pain in left toe(s): Secondary | ICD-10-CM

## 2021-06-18 DIAGNOSIS — M79674 Pain in right toe(s): Secondary | ICD-10-CM

## 2021-06-18 NOTE — Progress Notes (Addendum)
This patient returns to my office for at risk foot care.  This patient requires this care by a professional since this patient will be at risk due to having diagnosis of pvd, coagulation defect  and CKD stage 3. He is taking plavix.     This patient is unable to cut nails himself since the patient cannot reach his nails.These nails are painful walking and wearing shoes.  This patient presents for at risk foot care today.  General Appearance  Alert, conversant and in no acute stress.  Vascular  Dorsalis pedis and posterior tibial pulses are not palpable  B/L.  Swollen feet  B/L.  Absent digital hair.  Neurologic  Diminished  LOPS  B/L.  Nails Thick disfigured discolored nails with subungual debris  from hallux to fifth toes bilaterally. No evidence of bacterial infection or drainage bilaterally.  Orthopedic  No limitations of motion  feet .  No crepitus or effusions noted.  No bony pathology or digital deformities noted.  Skin  normotropic skin with no porokeratosis noted bilaterally.  No signs of infections or ulcers noted.     Onychomycosis  Pain in right toes  Pain in left toes  Consent was obtained for treatment procedures.   Mechanical debridement of nails 1-5  bilaterally performed with a nail nipper.  Filed with dremel without incident. Bandage applied to left leg where bleeding was occurring.   Return office visit  3 months                    Told patient to return for periodic foot care and evaluation due to potential at risk complications.   Gardiner Barefoot DPM

## 2021-07-03 ENCOUNTER — Ambulatory Visit: Payer: Medicare Other | Admitting: Cardiology

## 2021-07-20 ENCOUNTER — Other Ambulatory Visit: Payer: Self-pay | Admitting: *Deleted

## 2021-07-20 MED ORDER — AMLODIPINE BESYLATE 2.5 MG PO TABS: 2.5000 mg | ORAL_TABLET | Freq: Every day | ORAL | 1 refills | Status: DC

## 2021-08-31 ENCOUNTER — Other Ambulatory Visit: Payer: Self-pay

## 2021-08-31 ENCOUNTER — Ambulatory Visit (INDEPENDENT_AMBULATORY_CARE_PROVIDER_SITE_OTHER): Payer: Medicare Other | Admitting: Cardiology

## 2021-08-31 ENCOUNTER — Encounter: Payer: Self-pay | Admitting: Cardiology

## 2021-08-31 VITALS — BP 170/72 | HR 83 | Ht 74.0 in | Wt 228.0 lb

## 2021-08-31 DIAGNOSIS — I2510 Atherosclerotic heart disease of native coronary artery without angina pectoris: Secondary | ICD-10-CM | POA: Diagnosis not present

## 2021-08-31 DIAGNOSIS — R600 Localized edema: Secondary | ICD-10-CM

## 2021-08-31 DIAGNOSIS — I10 Essential (primary) hypertension: Secondary | ICD-10-CM | POA: Diagnosis not present

## 2021-08-31 MED ORDER — AMLODIPINE BESYLATE 2.5 MG PO TABS: 2.5000 mg | ORAL_TABLET | Freq: Every day | ORAL | 3 refills | Status: DC

## 2021-08-31 MED ORDER — ATORVASTATIN CALCIUM 80 MG PO TABS: 80.0000 mg | ORAL_TABLET | Freq: Every day | ORAL | 3 refills | Status: DC

## 2021-08-31 NOTE — Progress Notes (Signed)
Cardiology Office Note:    Date:  08/31/2021   ID:  Grant Mcdaniel, DOB 07-Feb-1941, MRN [id number]  PCP:  Marguerita Merles, MD  [facility] Cardiologist:  Kate Sable, MD  [place] Electrophysiologist:  None   Referring MD: Marguerita Merles, MD   Chief Complaint  Patient presents with   Other    6 month follow up. Patient c.o swelling in ankles. Meds reviewed verbally with patient.     History of Present Illness:    Grant Mcdaniel is a 80 y.o. male with a hx of CAD (PCI/DES to [place] in 2004 at [facility]), hypertension, CKD-III, diabetes, lymphedema, who presents for follow-up.   Patient being seen for CAD and lymphedema.  Started on Lasix with worsening creatinine.  Advised to take Lasix only as needed.  Blood pressure was previously elevated, Norvasc 2.5 mg daily was started.  Denies shortness of breath.  Takes medications as prescribed.  Repeat lab work 2 months after last visit showed stable/improved creatinine.  He has CKD 3.  He states his blood pressure is much improved, blood pressures at home range in the [id number] to 0000000 systolics.  Has not taking his amlodipine over the past 2 days since he ran out.  Otherwise feels well, denies chest pain or shortness of breath.   Prior notes Echocardiogram on 12/2019 showed normal systolic and diastolic function, EF 55 to 60%. Unna boots, wraps have been used to help with patient's symptoms.  This has not been very successful.  Worsening edema around the knees when noted.  There is need for diuretic therapy/Lasix to help with patient's edema.  He denies any chest pain or shortness of breath since his stent placement in 2004.  Compliant aspirin, Plavix, statin.  Past Medical History:  Diagnosis Date   CAD (coronary artery disease)    CKD (chronic kidney disease) stage 3, GFR 30-59 ml/min (HCC)    Diabetes mellitus without complication (HCC)    Hypercholesterolemia    Hypertension    Hypertension    Hypothyroid    PVD (peripheral  vascular disease) (HCC)    Venous insufficiency     Past Surgical History:  Procedure Laterality Date   galbladder     HIP ARTHROPLASTY Right 12/28/2019   Procedure: ARTHROPLASTY BIPOLAR HIP (HEMIARTHROPLASTY);  Surgeon: Thornton Park, MD;  Location: [facility] ORS;  Service: Orthopedics;  Laterality: Right;    Current Medications: Current Meds  Medication Sig   aspirin EC 81 MG tablet Take 81 mg by mouth at bedtime.    benazepril (LOTENSIN) 40 MG tablet Take 1 tablet (40 mg total) by mouth daily.   bisoprolol (ZEBETA) 10 MG tablet Take 1 tablet (10 mg total) by mouth daily.   Cholecalciferol 25 MCG (1000 UT) capsule Take 1,000 Units by mouth daily.    clopidogrel (PLAVIX) 75 MG tablet Take 75 mg by mouth daily.   Coenzyme Q10 10 MG capsule Take 10 mg by mouth daily.    fluticasone (FLONASE) 50 MCG/ACT nasal spray Place 1 spray into both nostrils daily.   levothyroxine (SYNTHROID) 75 MCG tablet Take 75 mcg by mouth daily before breakfast.    linagliptin (TRADJENTA) 5 MG TABS tablet Take 5 mg by mouth daily.    loratadine (CLARITIN) 10 MG tablet Take 10 mg by mouth daily.   Multiple Vitamin (MULTIVITAMIN WITH MINERALS) TABS tablet Take 1 tablet by mouth daily.   Omega-3 Fatty Acids (FISH OIL TRIPLE STRENGTH) 1400 MG CAPS Take 1 capsule by mouth daily.  tamsulosin (FLOMAX) 0.4 MG CAPS capsule Take 0.4 mg by mouth daily.    vitamin B-12 (CYANOCOBALAMIN) 500 MCG tablet Take 500 mcg by mouth daily.   [DISCONTINUED] amLODipine (NORVASC) 2.5 MG tablet Take 1 tablet (2.5 mg total) by mouth daily. MUST KEEP SCHEDULED APPOINTMENT TO CONTINUE GETTING REFILLS   [DISCONTINUED] atorvastatin (LIPITOR) 40 MG tablet Take 40 mg by mouth at bedtime.      Allergies:   Sulfa antibiotics   Social History   Socioeconomic History   Marital status: Married    Spouse name: Not on file   Number of children: Not on file   Years of education: Not on file   Highest education level: Not on file  Occupational  History   Not on file  Tobacco Use   Smoking status: Never   Smokeless tobacco: Never  Vaping Use   Vaping Use: Never used  Substance and Sexual Activity   Alcohol use: Never   Drug use: Never   Sexual activity: Not on file  Other Topics Concern   Not on file  Social History Narrative   Not on file   Social Determinants of Health   Financial Resource Strain: Not on file  Food Insecurity: Not on file  Transportation Needs: Not on file  Physical Activity: Not on file  Stress: Not on file  Social Connections: Not on file     Family History: The patient's family history includes CAD in his father; Cancer in his mother; Hypertension in his brother.  ROS:   Please see the history of present illness.     All other systems reviewed and are negative.  EKGs/Labs/Other Studies Reviewed:    The following studies were reviewed today:   EKG:  EKG is ordered today.  EKG shows sinus rhythm, occasional PVCs.  Recent Labs: 02/27/2021: BUN 36; Creatinine, Ser 1.70; Potassium 4.6; Sodium 137  Recent Lipid Panel    Component Value Date/Time   CHOL 113 12/30/2019 0341   TRIG 129 12/30/2019 0341   HDL 30 (L) 12/30/2019 0341   CHOLHDL 3.8 12/30/2019 0341   VLDL 26 12/30/2019 0341   LDLCALC 57 12/30/2019 0341    Physical Exam:    VS:  BP (!) 170/72 (BP Location: Right Arm, Patient Position: Sitting, Cuff Size: Normal)   Pulse 83   Ht '6\' 2"'$  (1.88 m)   Wt 228 lb (103.4 kg)   SpO2 97%   BMI 29.27 kg/m     Wt Readings from Last 3 Encounters:  08/31/21 228 lb (103.4 kg)  12/29/20 220 lb (99.8 kg)  12/04/20 224 lb (101.6 kg)     GEN:  Well nourished, well developed in no acute distress HEENT: Normal NECK: No JVD; No carotid bruits LYMPHATICS: No lymphadenopathy CARDIAC: RRR, no murmurs, rubs, gallops RESPIRATORY:  Clear to auscultation without rales, wheezing or rhonchi  ABDOMEN: Soft, non-tender, non-distended MUSCULOSKELETAL:  no edema around the knees SKIN: Warm and  dry NEUROLOGIC:  Alert and oriented x 3 PSYCHIATRIC:  Normal affect   ASSESSMENT:    1. Coronary artery disease involving native coronary artery of native heart without angina pectoris   2. Edema leg   3. Primary hypertension     PLAN:    In order of problems listed above:  CAD s/p PCI with DES to first obtuse marginal 2004. denies chest pain.  Continue aspirin, Plavix, Lipitor.  Echo [id number] normal systolic and diastolic function, EF 55 to 60%.  Recent lipid panel 04/27/2021 showed total cholesterol 206,  LDL 125.  Increase Lipitor to 80 mg daily. Lower extremity edema is resolved.  Creatinine stable.  Continue Lasix as needed.Marland Kitchen History of hypertension, BP elevated due to running out of meds.  Refill amlodipine 2.5 mg daily.  Continue benazepril 40 mg, bisoprolol daily.  Counseled on medication compliance.  Follow-up in 6 months.   Medication Adjustments/Labs and Tests Ordered: Current medicines are reviewed at length with the patient today.  Concerns regarding medicines are outlined above.  Orders Placed This Encounter  Procedures   Lipid panel    Meds ordered this encounter  Medications   amLODipine (NORVASC) 2.5 MG tablet    Sig: Take 1 tablet (2.5 mg total) by mouth daily.    Dispense:  90 tablet    Refill:  3   atorvastatin (LIPITOR) 80 MG tablet    Sig: Take 1 tablet (80 mg total) by mouth at bedtime.    Dispense:  90 tablet    Refill:  3     Patient Instructions  Medication Instructions:   Your physician has recommended you make the following change in your medication:    INCREASE your Lipitor to 80 MG daily.  *If you need a refill on your cardiac medications before your next appointment, please call your pharmacy*   Lab Work:  Your physician recommends that you return for a FASTING lipid profile:  IN [place] office will call you closer to the date to schedule you to come in.  - You will need to be fasting. Please do not have anything to eat or  drink after midnight the morning you have the lab work. You may only have water or black coffee with no cream or sugar.     Testing/Procedures: None ordered    Follow-Up: At [facility], you and your health needs are our priority.  As part of our continuing mission to provide you with exceptional heart care, we have created designated Provider Care Teams.  These Care Teams include your primary Cardiologist (physician) and Advanced Practice Providers (APPs -  Physician Assistants and Nurse Practitioners) who all work together to provide you with the care you need, when you need it.  We recommend signing up for the patient portal called "MyChart".  Sign up information is provided on this After Visit Summary.  MyChart is used to connect with patients for Virtual Visits (Telemedicine).  Patients are able to view lab/test results, encounter notes, upcoming appointments, etc.  Non-urgent messages can be sent to your provider as well.   To learn more about what you can do with MyChart, go to [email or web address].    Your next appointment:   6 month(s)  The format for your next appointment:   In Person  Provider:   Kate Sable, MD   Other Instructions    Signed, Kate Sable, MD  08/31/2021 4:34 PM    [place]

## 2021-08-31 NOTE — Patient Instructions (Signed)
Medication Instructions:   Your physician has recommended you make the following change in your medication:    INCREASE your Lipitor to 80 MG daily.  *If you need a refill on your cardiac medications before your next appointment, please call your pharmacy*   Lab Work:  Your physician recommends that you return for a FASTING lipid profile:  IN [place] office will call you closer to the date to schedule you to come in.  - You will need to be fasting. Please do not have anything to eat or drink after midnight the morning you have the lab work. You may only have water or black coffee with no cream or sugar.     Testing/Procedures: None ordered    Follow-Up: At [facility], you and your health needs are our priority.  As part of our continuing mission to provide you with exceptional heart care, we have created designated Provider Care Teams.  These Care Teams include your primary Cardiologist (physician) and Advanced Practice Providers (APPs -  Physician Assistants and Nurse Practitioners) who all work together to provide you with the care you need, when you need it.  We recommend signing up for the patient portal called "MyChart".  Sign up information is provided on this After Visit Summary.  MyChart is used to connect with patients for Virtual Visits (Telemedicine).  Patients are able to view lab/test results, encounter notes, upcoming appointments, etc.  Non-urgent messages can be sent to your provider as well.   To learn more about what you can do with MyChart, go to [email or web address].    Your next appointment:   6 month(s)  The format for your next appointment:   In Person  Provider:   Kate Sable, MD   Other Instructions

## 2021-09-07 NOTE — Addendum Note (Signed)
Addended by: Janan Ridge on: 09/07/2021 03:13 PM   Modules accepted: Orders

## 2021-09-17 ENCOUNTER — Other Ambulatory Visit: Payer: Self-pay

## 2021-09-17 ENCOUNTER — Encounter: Payer: Self-pay | Admitting: Podiatry

## 2021-09-17 ENCOUNTER — Ambulatory Visit (INDEPENDENT_AMBULATORY_CARE_PROVIDER_SITE_OTHER): Payer: Medicare Other | Admitting: Podiatry

## 2021-09-17 DIAGNOSIS — E1122 Type 2 diabetes mellitus with diabetic chronic kidney disease: Secondary | ICD-10-CM

## 2021-09-17 DIAGNOSIS — D689 Coagulation defect, unspecified: Secondary | ICD-10-CM

## 2021-09-17 DIAGNOSIS — B351 Tinea unguium: Secondary | ICD-10-CM | POA: Diagnosis not present

## 2021-09-17 DIAGNOSIS — E119 Type 2 diabetes mellitus without complications: Secondary | ICD-10-CM

## 2021-09-17 DIAGNOSIS — I739 Peripheral vascular disease, unspecified: Secondary | ICD-10-CM

## 2021-09-17 DIAGNOSIS — M79675 Pain in left toe(s): Secondary | ICD-10-CM

## 2021-09-17 DIAGNOSIS — N183 Chronic kidney disease, stage 3 unspecified: Secondary | ICD-10-CM

## 2021-09-17 DIAGNOSIS — M79674 Pain in right toe(s): Secondary | ICD-10-CM

## 2021-09-17 NOTE — Progress Notes (Signed)
This patient returns to my office for at risk foot care.  This patient requires this care by a professional since this patient will be at risk due to having diagnosis of pvd, coagulation defect  and CKD stage 3. He is taking plavix.     This patient is unable to cut nails himself since the patient cannot reach his nails.These nails are painful walking and wearing shoes.  This patient presents for at risk foot care today.  General Appearance  Alert, conversant and in no acute stress.  Vascular  Dorsalis pedis and posterior tibial pulses are not palpable  B/L.  Swollen feet  B/L.  Absent digital hair.  Neurologic  Diminished  LOPS  B/L.  Nails Thick disfigured discolored nails with subungual debris  from hallux to fifth toes bilaterally. No evidence of bacterial infection or drainage bilaterally.  Orthopedic  No limitations of motion  feet .  No crepitus or effusions noted.  No bony pathology or digital deformities noted.  Skin  normotropic skin with no porokeratosis noted bilaterally.  No signs of infections or ulcers noted.     Onychomycosis  Pain in right toes  Pain in left toes  Consent was obtained for treatment procedures.   Mechanical debridement of nails 1-5  bilaterally performed with a nail nipper.  Filed with dremel without incident.    Return office visit  3 months                    Told patient to return for periodic foot care and evaluation due to potential at risk complications.   Sherion Dooly DPM  

## 2021-12-08 IMAGING — CR DG HIP (WITH OR WITHOUT PELVIS) 2-3V*R*
5 series · 5 of 5 positions shown · non-contrast
Comparison: No recent.

CLINICAL DATA: Fall.

EXAM:
DG HIP (WITH OR WITHOUT PELVIS) 2-3V RIGHT

[pelvis ap (1 of 2)]
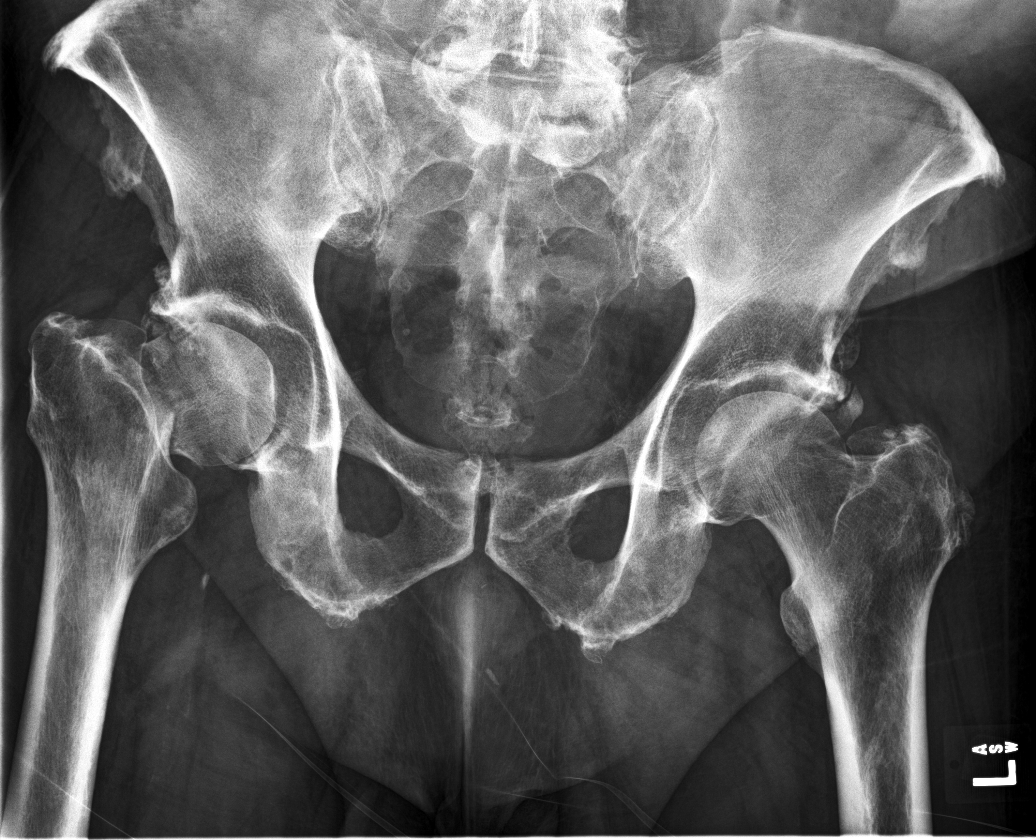

[hip ap]
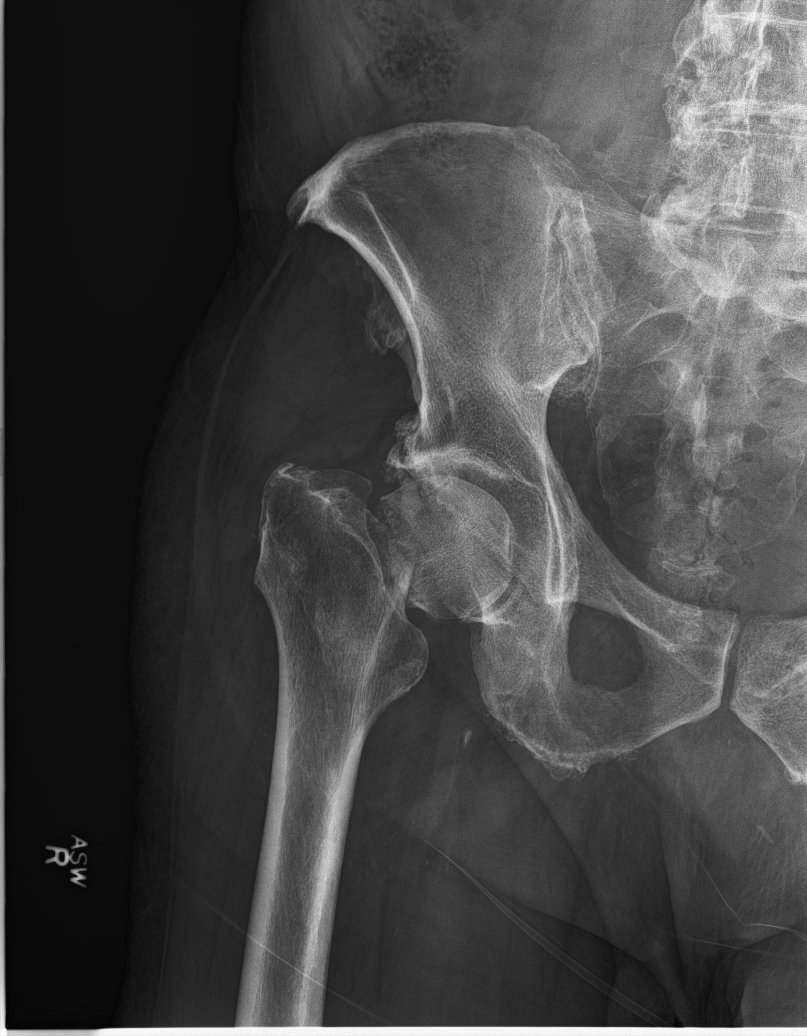

[hip lat (1 of 2)]
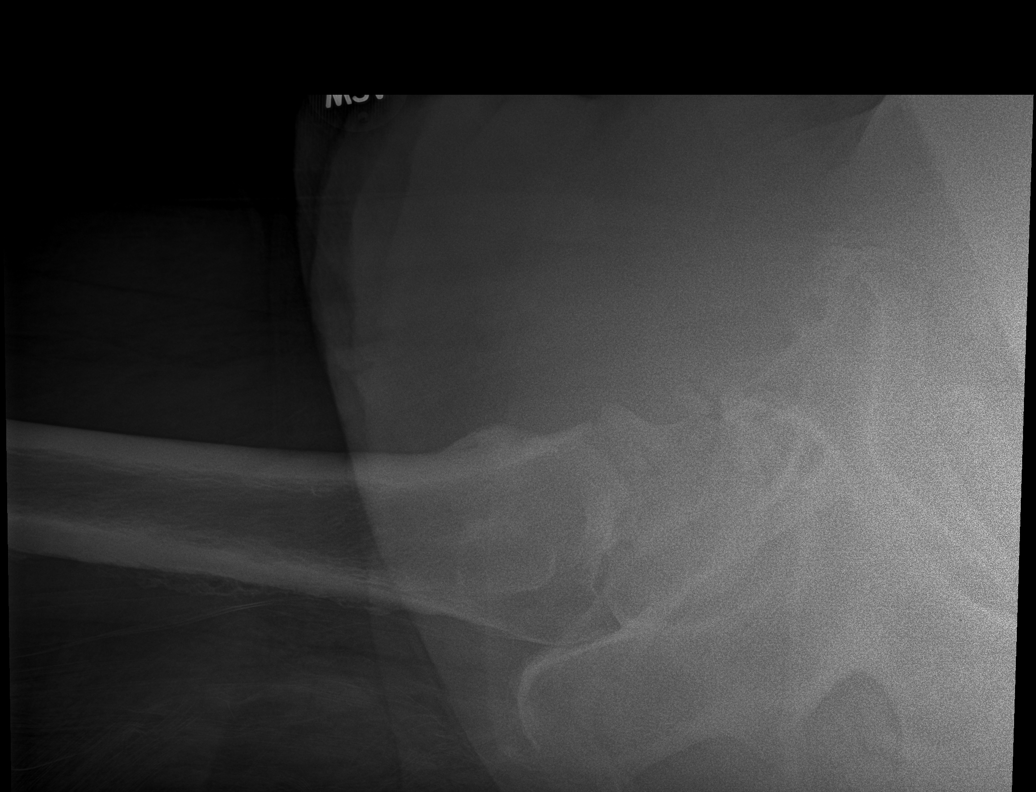

[pelvis ap (2 of 2)]
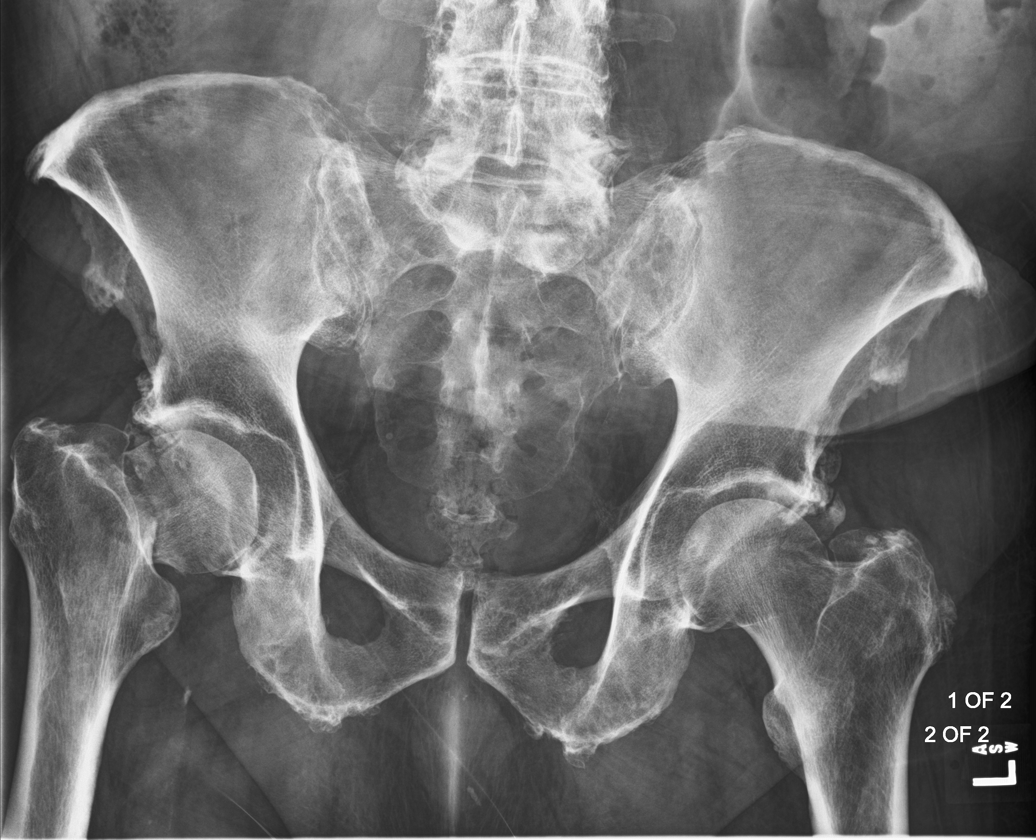

[hip lat (2 of 2)]
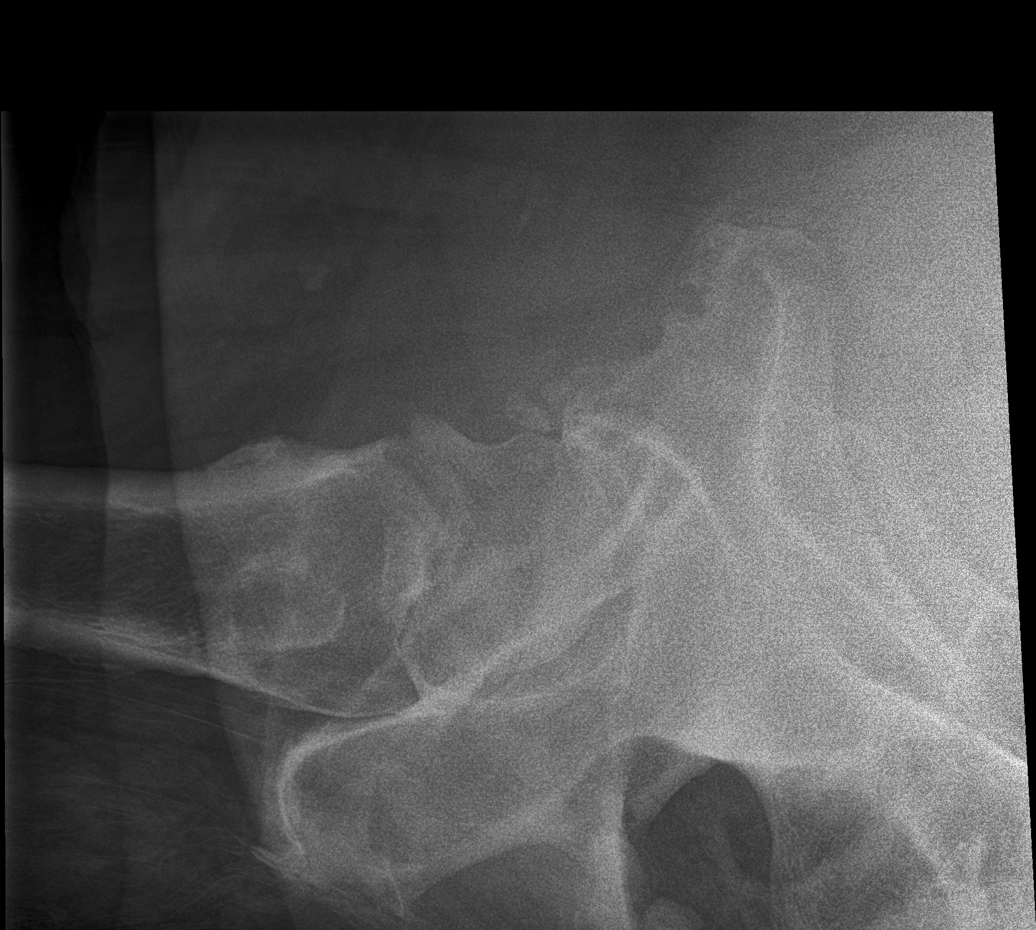

[5 of 5 positions shown; findings below may reference images not displayed]

FINDINGS: Diffuse severe osteopenia and degenerative change. Angulated
fracture of the right femoral neck is noted. No evidence of
dislocation.
IMPRESSION: 1.  Angulated right femoral neck fracture.

2.  Diffuse severe osteopenia degenerative change.

## 2021-12-08 IMAGING — DX DG CHEST 1V PORT
2 series · 2 of 2 positions shown · non-contrast
Comparison: None.

CLINICAL DATA: Right hip pain, hypertension

EXAM:
PORTABLE CHEST 1 VIEW

[chest ap (1 of 2)]
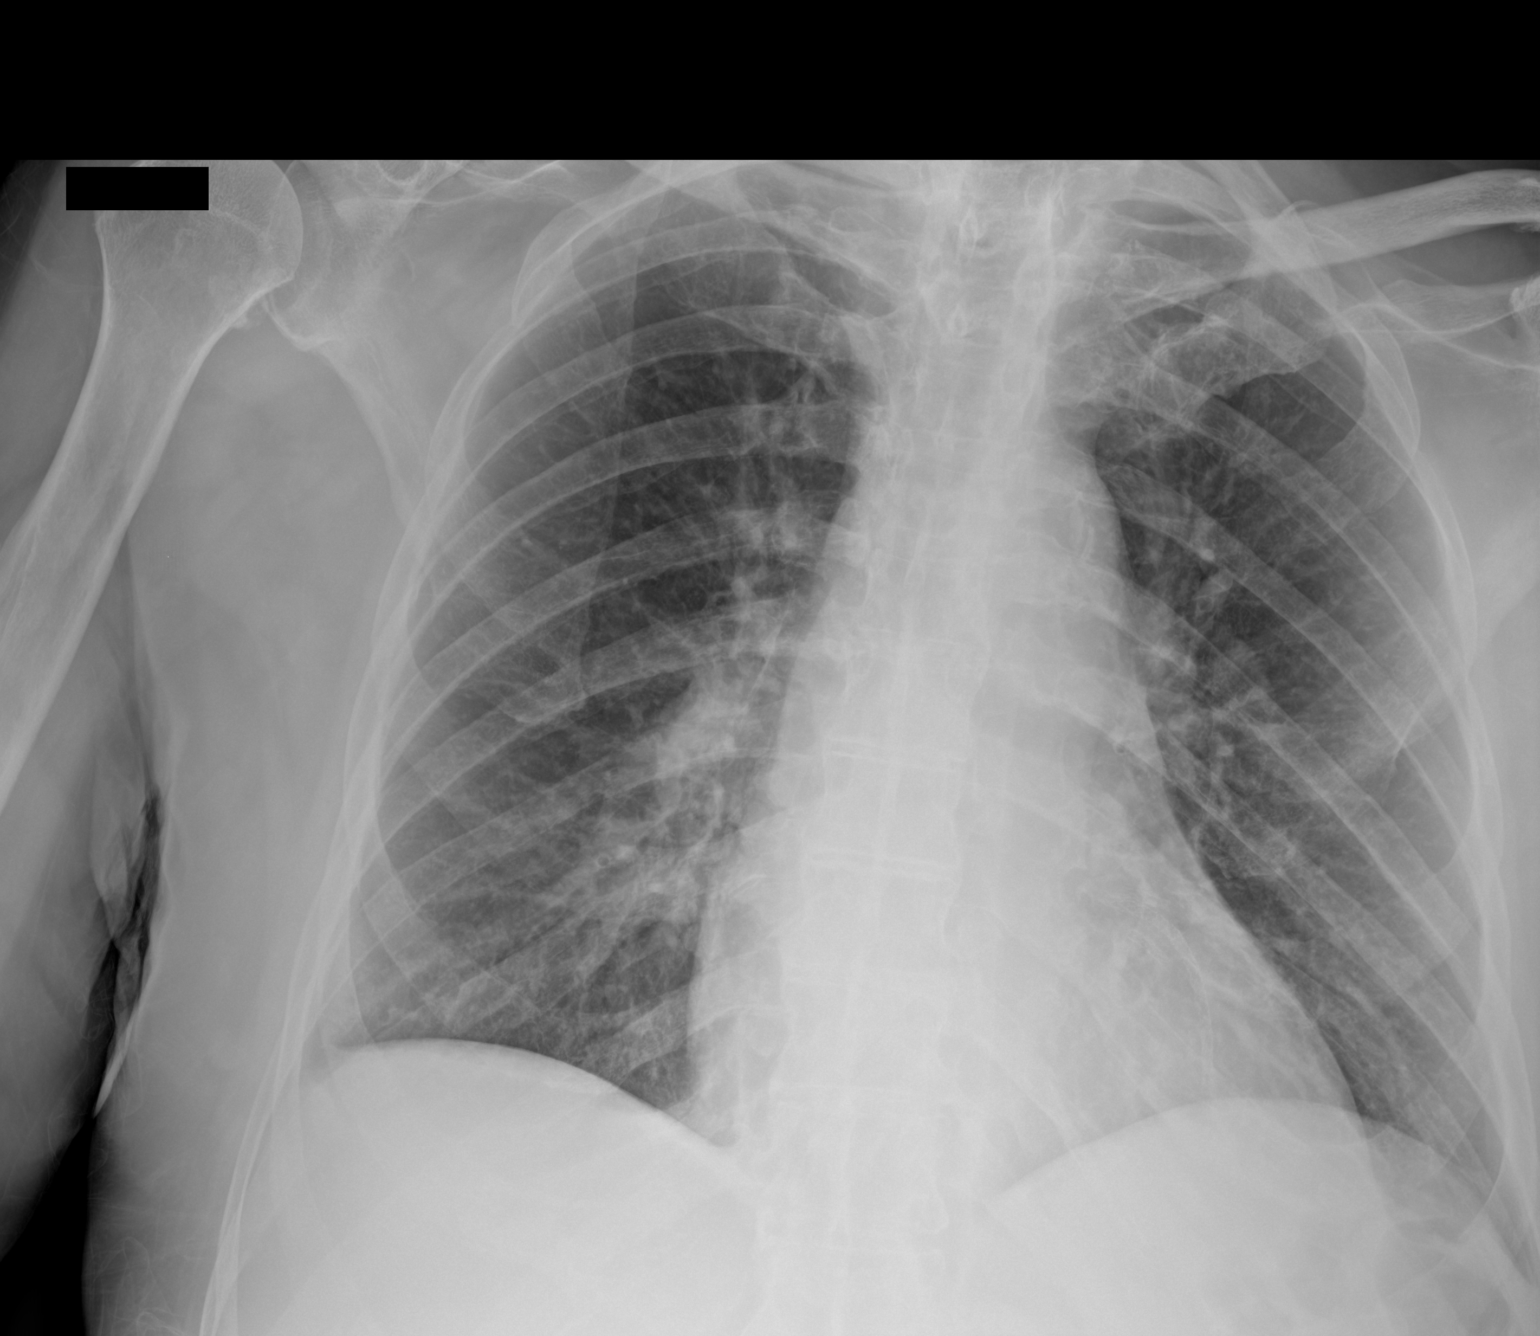

[chest ap (2 of 2)]
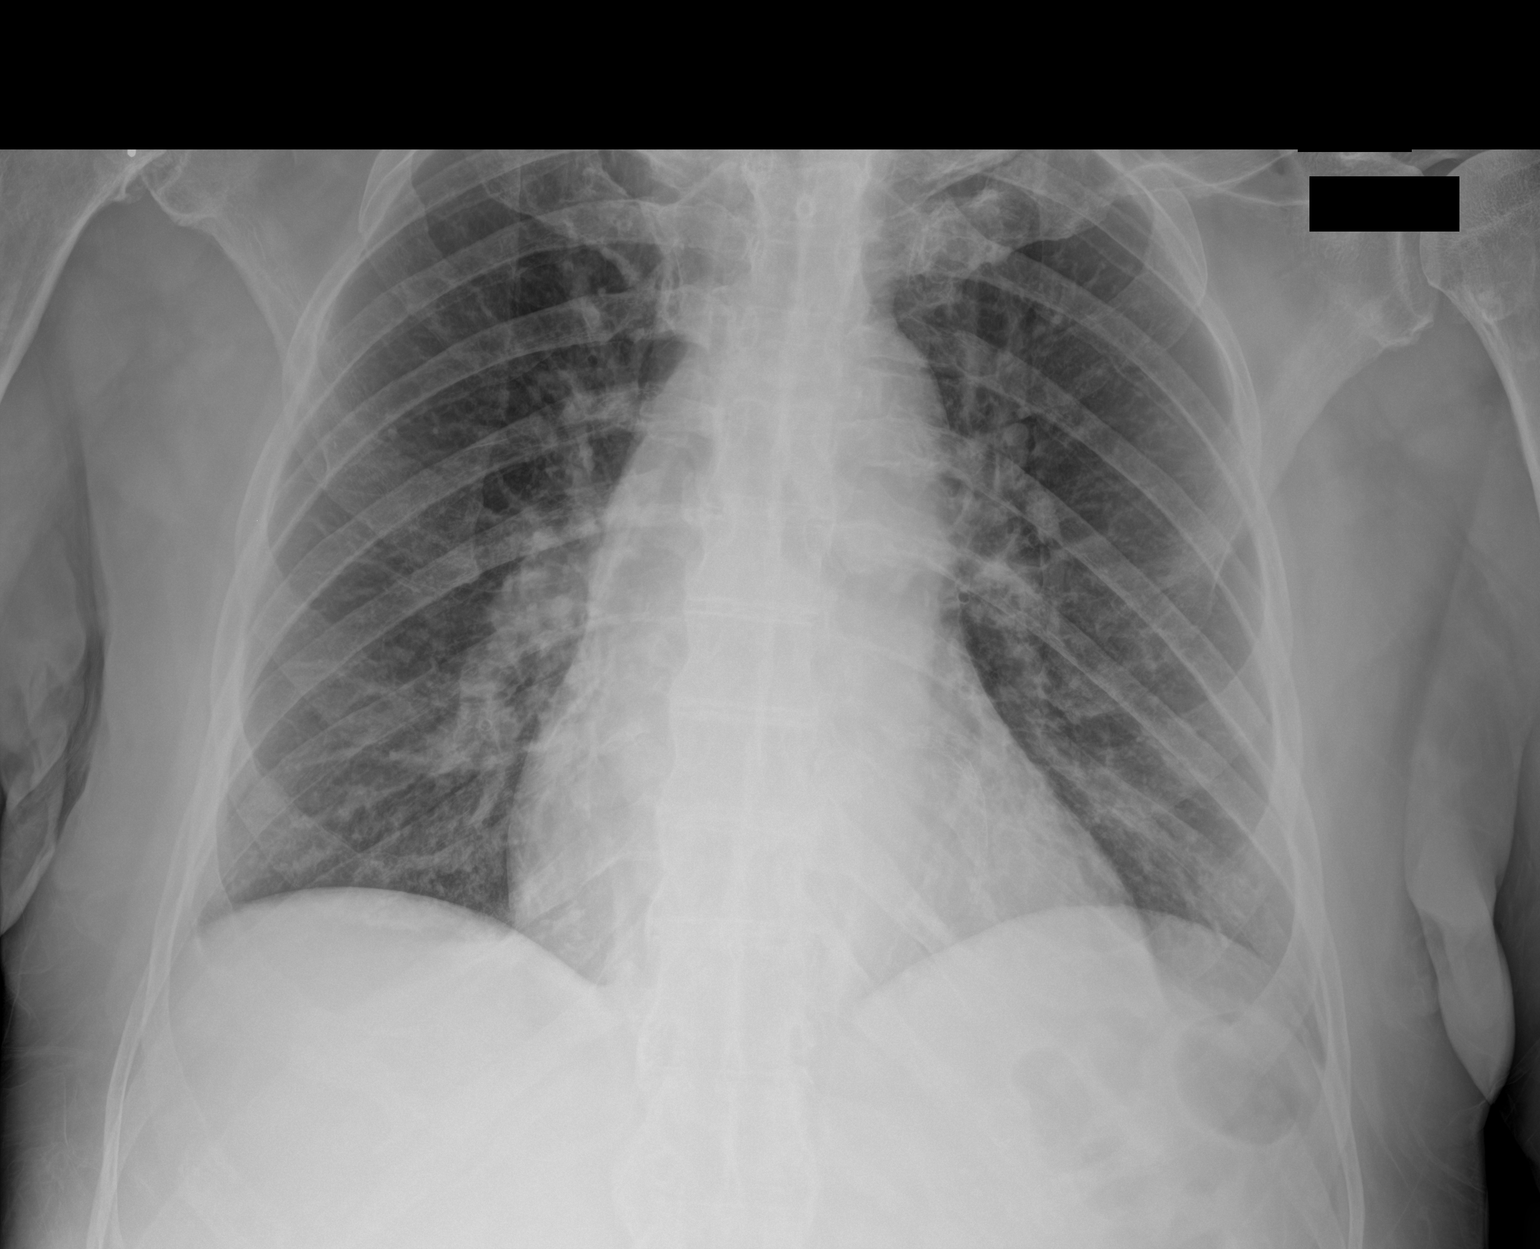

[2 of 2 positions shown; findings below may reference images not displayed]

FINDINGS: Heart size is mildly enlarged. Aortic atherosclerosis. Both lungs
are clear. The visualized skeletal structures are unremarkable.
IMPRESSION: 1. No active cardiopulmonary disease.
2. Mild cardiomegaly.
3. Aortic atherosclerosis.

## 2021-12-17 ENCOUNTER — Ambulatory Visit: Payer: Medicare Other | Admitting: Podiatry

## 2022-02-25 ENCOUNTER — Ambulatory Visit (INDEPENDENT_AMBULATORY_CARE_PROVIDER_SITE_OTHER): Payer: 59 | Admitting: Podiatry

## 2022-02-25 ENCOUNTER — Encounter: Payer: Self-pay | Admitting: Podiatry

## 2022-02-25 ENCOUNTER — Other Ambulatory Visit: Payer: Self-pay

## 2022-02-25 DIAGNOSIS — E1122 Type 2 diabetes mellitus with diabetic chronic kidney disease: Secondary | ICD-10-CM

## 2022-02-25 DIAGNOSIS — M79674 Pain in right toe(s): Secondary | ICD-10-CM | POA: Diagnosis not present

## 2022-02-25 DIAGNOSIS — B351 Tinea unguium: Secondary | ICD-10-CM | POA: Diagnosis not present

## 2022-02-25 DIAGNOSIS — N183 Chronic kidney disease, stage 3 unspecified: Secondary | ICD-10-CM

## 2022-02-25 DIAGNOSIS — E119 Type 2 diabetes mellitus without complications: Secondary | ICD-10-CM

## 2022-02-25 DIAGNOSIS — I739 Peripheral vascular disease, unspecified: Secondary | ICD-10-CM

## 2022-02-25 DIAGNOSIS — M79675 Pain in left toe(s): Secondary | ICD-10-CM

## 2022-02-25 DIAGNOSIS — D689 Coagulation defect, unspecified: Secondary | ICD-10-CM

## 2022-02-25 NOTE — Progress Notes (Signed)
This patient returns to my office for at risk foot care.  This patient requires this care by a professional since this patient will be at risk due to having diagnosis of pvd, coagulation defect  and CKD stage 3. He is taking plavix.     This patient is unable to cut nails himself since the patient cannot reach his nails.These nails are painful walking and wearing shoes.  This patient presents for at risk foot care today.  General Appearance  Alert, conversant and in no acute stress.  Vascular  Dorsalis pedis and posterior tibial pulses are not palpable  B/L.  Swollen feet  B/L.  Absent digital hair.  Neurologic  Diminished  LOPS  B/L.  Nails Thick disfigured discolored nails with subungual debris  from hallux to fifth toes bilaterally. No evidence of bacterial infection or drainage bilaterally.  Orthopedic  No limitations of motion  feet .  No crepitus or effusions noted.  No bony pathology or digital deformities noted.  Skin  normotropic skin with no porokeratosis noted bilaterally.  No signs of infections or ulcers noted.     Onychomycosis  Pain in right toes  Pain in left toes  Consent was obtained for treatment procedures.   Mechanical debridement of nails 1-5  bilaterally performed with a nail nipper.  Filed with dremel without incident.    Return office visit  3 months                    Told patient to return for periodic foot care and evaluation due to potential at risk complications.   Janiya Millirons DPM  

## 2022-03-05 ENCOUNTER — Encounter: Payer: Self-pay | Admitting: Cardiology

## 2022-03-05 ENCOUNTER — Other Ambulatory Visit: Payer: Self-pay

## 2022-03-05 ENCOUNTER — Ambulatory Visit (INDEPENDENT_AMBULATORY_CARE_PROVIDER_SITE_OTHER): Payer: 59 | Admitting: Cardiology

## 2022-03-05 DIAGNOSIS — I10 Essential (primary) hypertension: Secondary | ICD-10-CM | POA: Diagnosis not present

## 2022-03-05 DIAGNOSIS — E7800 Pure hypercholesterolemia, unspecified: Secondary | ICD-10-CM | POA: Diagnosis not present

## 2022-03-05 DIAGNOSIS — I2510 Atherosclerotic heart disease of native coronary artery without angina pectoris: Secondary | ICD-10-CM | POA: Diagnosis not present

## 2022-03-05 MED ORDER — AMLODIPINE BESYLATE 10 MG PO TABS: 10.0000 mg | ORAL_TABLET | Freq: Every day | ORAL | 3 refills | Status: DC

## 2022-03-05 NOTE — Patient Instructions (Signed)
Medication Instructions:  ?Your physician has recommended you make the following change in your medication:  ? ?INCREASE Amlodipine to 10 mg daily. An Rx has been sent to your pharmacy. ? ? ?*If you need a refill on your cardiac medications before your next appointment, please call your pharmacy* ? ? ?Lab Work: ?None ordered ? ?If you have labs (blood work) drawn today and your tests are completely normal, you will receive your results only by: ?MyChart Message (if you have MyChart) OR ?A paper copy in the mail ?If you have any lab test that is abnormal or we need to change your treatment, we will call you to review the results. ? ? ?Testing/Procedures: ?None ordered ? ? ?Follow-Up: ?At [facility], you and your health needs are our priority.  As part of our continuing mission to provide you with exceptional heart care, we have created designated Provider Care Teams.  These Care Teams include your primary Cardiologist (physician) and Advanced Practice Providers (APPs -  Physician Assistants and Nurse Practitioners) who all work together to provide you with the care you need, when you need it. ? ?We recommend signing up for the patient portal called "MyChart".  Sign up information is provided on this After Visit Summary.  MyChart is used to connect with patients for Virtual Visits (Telemedicine).  Patients are able to view lab/test results, encounter notes, upcoming appointments, etc.  Non-urgent messages can be sent to your provider as well.   ?To learn more about what you can do with MyChart, go to [email or web address].   ? ?Your next appointment:   ?6 week(s) ? ?The format for your next appointment:   ?In Person ? ?Provider:   ?You may see Kate Sable, MD or one of the following Advanced Practice Providers on your designated Care Team:   ?Murray Hodgkins, NP ?Christell Faith, PA-C ?Cadence Kathlen Mody, PA-C ? ? ?Other Instructions ?N/A ? ?

## 2022-03-05 NOTE — Progress Notes (Signed)
?Cardiology Office Note:   ? ?Date:  03/05/2022  ? ?ID:  Grant Mcdaniel, DOB 07/20/1941, MRN [id number] ? ?PCP:  Marguerita Merles, MD  ?[facility] Cardiologist:  Kate Sable, MD  ?[facility] Electrophysiologist:  None  ? ?Referring MD: Marguerita Merles, MD  ? ?Chief Complaint  ?Patient presents with  ? Follow-up  ?  6 month F/U-Patient states his kidney specialist would like him to discuss increasing his BP med to lower his blood pressure.  ? ? ?History of Present Illness:   ? ?Grant Mcdaniel is a 81 y.o. male with a hx of CAD (PCI/DES to [place] in 2004 at [facility]), hypertension, hyperlipidemia, CKD-III, diabetes, lymphedema, who presents for follow-up.  ? ?Patient being seen for CAD, hypertension, hyperlipidemia.  BP elevated at last visit, medication compliance advised.  Avoiding nephrotoxic's due to CKD.  His nephrologist stopped benazepril and bisoprolol.  Unsure why bisoprolol was stopped as this is not nephrotoxic.  BP still elevated. ? ? ?Prior notes ?Echocardiogram on 12/2019 showed normal systolic and diastolic function, EF 55 to 60%. ?Unna boots, wraps have been used to help with patient's symptoms.  This has not been very successful.  Worsening edema around the knees when noted.  There is need for diuretic therapy/Lasix to help with patient's edema.  He denies any chest pain or shortness of breath since his stent placement in 2004.  Compliant aspirin, Plavix, statin. ? ?Past Medical History:  ?Diagnosis Date  ? CAD (coronary artery disease)   ? CKD (chronic kidney disease) stage 3, GFR 30-59 ml/min (HCC)   ? Diabetes mellitus without complication ([place])   ? Hypercholesterolemia   ? Hypertension   ? Hypertension   ? Hypothyroid   ? PVD (peripheral vascular disease) ([place])   ? Venous insufficiency   ? ? ?Past Surgical History:  ?Procedure Laterality Date  ? galbladder    ? HIP ARTHROPLASTY Right 12/28/2019  ? Procedure: ARTHROPLASTY BIPOLAR HIP (HEMIARTHROPLASTY);  Surgeon: Thornton Park, MD;  Location:  [facility] ORS;  Service: Orthopedics;  Laterality: Right;  ? ? ?Current Medications: ?Current Meds  ?Medication Sig  ? aspirin EC 81 MG tablet Take 81 mg by mouth at bedtime.   ? atorvastatin (LIPITOR) 80 MG tablet Take 1 tablet (80 mg total) by mouth at bedtime.  ? clopidogrel (PLAVIX) 75 MG tablet Take 75 mg by mouth daily.  ? Coenzyme Q10 10 MG capsule Take 10 mg by mouth daily.   ? fluticasone (FLONASE) 50 MCG/ACT nasal spray Place 1 spray into both nostrils daily.  ? furosemide (LASIX) 40 MG tablet Take 1 tablet (40 mg total) by mouth as needed.  ? levothyroxine (SYNTHROID) 75 MCG tablet Take 75 mcg by mouth daily before breakfast.   ? linagliptin (TRADJENTA) 5 MG TABS tablet Take 5 mg by mouth daily.   ? loratadine (CLARITIN) 10 MG tablet Take 10 mg by mouth daily.  ? Multiple Vitamin (MULTIVITAMIN WITH MINERALS) TABS tablet Take 1 tablet by mouth daily.  ? tamsulosin (FLOMAX) 0.4 MG CAPS capsule Take 0.4 mg by mouth daily.   ? vitamin B-12 (CYANOCOBALAMIN) 500 MCG tablet Take 500 mcg by mouth daily.  ? [DISCONTINUED] amLODipine (NORVASC) 2.5 MG tablet Take 1 tablet (2.5 mg total) by mouth daily.  ? [DISCONTINUED] bisoprolol (ZEBETA) 10 MG tablet Take 1 tablet (10 mg total) by mouth daily.  ?  ? ?Allergies:   Sulfa antibiotics  ? ?Social History  ? ?Socioeconomic History  ? Marital status: Married  ?  Spouse name: Not on file  ? Number of children: Not on file  ? Years of education: Not on file  ? Highest education level: Not on file  ?Occupational History  ? Not on file  ?Tobacco Use  ? Smoking status: Former  ?  Types: Cigarettes  ? Smokeless tobacco: Current  ?  Types: Chew  ?Vaping Use  ? Vaping Use: Never used  ?Substance and Sexual Activity  ? Alcohol use: Never  ? Drug use: Never  ? Sexual activity: Not on file  ?Other Topics Concern  ? Not on file  ?Social History Narrative  ? Not on file  ? ?Social Determinants of Health  ? ?Financial Resource Strain: Not on file  ?Food Insecurity: Not on file   ?Transportation Needs: Not on file  ?Physical Activity: Not on file  ?Stress: Not on file  ?Social Connections: Not on file  ?  ? ?Family History: ?The patient's family history includes CAD in his father; Cancer in his mother; Hypertension in his brother. ? ?ROS:   ?Please see the history of present illness.    ? All other systems reviewed and are negative. ? ?EKGs/Labs/Other Studies Reviewed:   ? ?The following studies were reviewed today: ? ? ?EKG:  EKG is ordered today.  EKG shows sinus rhythm, occasional PVCs. ? ?Recent Labs: ?No results found for requested labs within last 8760 hours.  ?Recent Lipid Panel ?   ?Component Value Date/Time  ? CHOL 113 12/30/2019 0341  ? TRIG 129 12/30/2019 0341  ? HDL 30 (L) 12/30/2019 0341  ? CHOLHDL 3.8 12/30/2019 0341  ? VLDL 26 12/30/2019 0341  ? [place] 57 12/30/2019 0341  ? ? ?Physical Exam:   ? ?VS:  BP (!) 170/72 (BP Location: Left Arm, Patient Position: Sitting, Cuff Size: Large)   Pulse 90   Ht '6\' 2"'$  (1.88 m)   Wt 229 lb (103.9 kg)   SpO2 96%   BMI 29.40 kg/m?    ? ?Wt Readings from Last 3 Encounters:  ?03/05/22 229 lb (103.9 kg)  ?08/31/21 228 lb (103.4 kg)  ?12/29/20 220 lb (99.8 kg)  ?  ? ?GEN:  Well nourished, well developed in no acute distress ?HEENT: Normal ?NECK: No JVD; No carotid bruits ?LYMPHATICS: No lymphadenopathy ?CARDIAC: RRR, no murmurs, rubs, gallops ?RESPIRATORY:  Clear to auscultation without rales, wheezing or rhonchi  ?ABDOMEN: Soft, non-tender, non-distended ?MUSCULOSKELETAL:  no edema around the knees ?SKIN: Warm and dry ?NEUROLOGIC:  Alert and oriented x 3 ?PSYCHIATRIC:  Normal affect  ? ?ASSESSMENT:   ? ?1. Coronary artery disease involving native coronary artery of native heart without angina pectoris   ?2. Primary hypertension   ?3. Pure hypercholesterolemia   ? ? ? ?PLAN:   ? ?In order of problems listed above: ? ?CAD s/p PCI with DES to OM1 2004. denies chest pain.  Continue aspirin, Plavix, Lipitor.  Echo 12/2019 EF 55 to 60%.    ?Hyperlipidemia, continue Lipitor 80 mg daily.   ?History of hypertension, BP elevated.  Increase amlodipine to 10 mg daily.  Consider adding Coreg at follow-up visit if BP stays elevated. ? ?Follow-up in 6 weeks. ? ? ?Medication Adjustments/Labs and Tests Ordered: ?Current medicines are reviewed at length with the patient today.  Concerns regarding medicines are outlined above.  ?Orders Placed This Encounter  ?Procedures  ? EKG 12-Lead  ? ? ?Meds ordered this encounter  ?Medications  ? amLODipine (NORVASC) 10 MG tablet  ?  Sig: Take 1 tablet (10 mg  total) by mouth daily.  ?  Dispense:  90 tablet  ?  Refill:  3  ?  Dosage increase  ? ? ? ?Patient Instructions  ?Medication Instructions:  ?Your physician has recommended you make the following change in your medication:  ? ?INCREASE Amlodipine to 10 mg daily. An Rx has been sent to your pharmacy. ? ? ?*If you need a refill on your cardiac medications before your next appointment, please call your pharmacy* ? ? ?Lab Work: ?None ordered ? ?If you have labs (blood work) drawn today and your tests are completely normal, you will receive your results only by: ?MyChart Message (if you have MyChart) OR ?A paper copy in the mail ?If you have any lab test that is abnormal or we need to change your treatment, we will call you to review the results. ? ? ?Testing/Procedures: ?None ordered ? ? ?Follow-Up: ?At [facility], you and your health needs are our priority.  As part of our continuing mission to provide you with exceptional heart care, we have created designated Provider Care Teams.  These Care Teams include your primary Cardiologist (physician) and Advanced Practice Providers (APPs -  Physician Assistants and Nurse Practitioners) who all work together to provide you with the care you need, when you need it. ? ?We recommend signing up for the patient portal called "MyChart".  Sign up information is provided on this After Visit Summary.  MyChart is used to connect with  patients for Virtual Visits (Telemedicine).  Patients are able to view lab/test results, encounter notes, upcoming appointments, etc.  Non-urgent messages can be sent to your provider as well.   ?To learn more about what you c

## 2022-03-11 ENCOUNTER — Encounter (INDEPENDENT_AMBULATORY_CARE_PROVIDER_SITE_OTHER): Payer: Self-pay | Admitting: Vascular Surgery

## 2022-03-11 ENCOUNTER — Ambulatory Visit (INDEPENDENT_AMBULATORY_CARE_PROVIDER_SITE_OTHER): Payer: 59 | Admitting: Vascular Surgery

## 2022-03-11 ENCOUNTER — Other Ambulatory Visit: Payer: Self-pay

## 2022-03-11 VITALS — BP 165/73 | HR 82 | Resp 16 | Wt 232.0 lb

## 2022-03-11 DIAGNOSIS — E785 Hyperlipidemia, unspecified: Secondary | ICD-10-CM

## 2022-03-11 DIAGNOSIS — E119 Type 2 diabetes mellitus without complications: Secondary | ICD-10-CM | POA: Diagnosis not present

## 2022-03-11 DIAGNOSIS — I872 Venous insufficiency (chronic) (peripheral): Secondary | ICD-10-CM

## 2022-03-11 DIAGNOSIS — I890 Lymphedema, not elsewhere classified: Secondary | ICD-10-CM

## 2022-03-11 DIAGNOSIS — I10 Essential (primary) hypertension: Secondary | ICD-10-CM | POA: Diagnosis not present

## 2022-03-11 HISTORY — DX: Lymphedema, not elsewhere classified: I89.0

## 2022-03-11 NOTE — Progress Notes (Signed)
? ? ?MRN : [id number] ? ?Grant Mcdaniel is a 81 y.o. (August 08, 1941) male who presents with chief complaint of check swelling. ? ?History of Present Illness:  ? ?The patient returns to the office for followup evaluation regarding leg swelling.  The left leg is more affected than the right.  The swelling has persisted and the pain associated with swelling continues. There have not been any interval development of a ulcerations or wounds. ? ?Since the previous visit the patient has been wearing graduated compression stockings and has noted little if any improvement in the lymphedema. The patient has been using compression routinely morning until night. ? ?The patient also states elevation during the day and exercise is being done too.  ? ?No outpatient medications have been marked as taking for the 03/11/22 encounter (Appointment) with Delana Meyer, Dolores Lory, MD.  ? ? ?Past Medical History:  ?Diagnosis Date  ? CAD (coronary artery disease)   ? CKD (chronic kidney disease) stage 3, GFR 30-59 ml/min (HCC)   ? Diabetes mellitus without complication ([place])   ? Hypercholesterolemia   ? Hypertension   ? Hypertension   ? Hypothyroid   ? PVD (peripheral vascular disease) ([place])   ? Venous insufficiency   ? ? ?Past Surgical History:  ?Procedure Laterality Date  ? galbladder    ? HIP ARTHROPLASTY Right 12/28/2019  ? Procedure: ARTHROPLASTY BIPOLAR HIP (HEMIARTHROPLASTY);  Surgeon: Thornton Park, MD;  Location: [facility] ORS;  Service: Orthopedics;  Laterality: Right;  ? ? ?Social History ?Social History  ? ?Tobacco Use  ? Smoking status: Former  ?  Types: Cigarettes  ? Smokeless tobacco: Current  ?  Types: Chew  ?Vaping Use  ? Vaping Use: Never used  ?Substance Use Topics  ? Alcohol use: Never  ? Drug use: Never  ? ? ?Family History ?Family History  ?Problem Relation Age of Onset  ? Cancer Mother   ? CAD Father   ? Hypertension Brother   ? ? ?Allergies  ?Allergen Reactions  ? Sulfa Antibiotics Nausea And Vomiting  ? ? ? ?REVIEW OF SYSTEMS  (Negative unless checked) ? ?Constitutional: '[]'$ Weight loss  '[]'$ Fever  '[]'$ Chills ?Cardiac: '[]'$ Chest pain   '[]'$ Chest pressure   '[]'$ Palpitations   '[]'$ Shortness of breath when laying flat   '[]'$ Shortness of breath with exertion. ?Vascular:  '[]'$ Pain in legs with walking   '[]'$ Pain in legs at rest  '[]'$ History of DVT   '[]'$ Phlebitis   '[x]'$ Swelling in legs   '[]'$ Varicose veins   '[]'$ Non-healing ulcers ?Pulmonary:   '[]'$ Uses home oxygen   '[]'$ Productive cough   '[]'$ Hemoptysis   '[]'$ Wheeze  '[]'$ COPD   '[]'$ Asthma ?Neurologic:  '[]'$ Dizziness   '[]'$ Seizures   '[]'$ History of stroke   '[]'$ History of TIA  '[]'$ Aphasia   '[]'$ Vissual changes   '[]'$ Weakness or numbness in arm   '[]'$ Weakness or numbness in leg ?Musculoskeletal:   '[]'$ Joint swelling   '[]'$ Joint pain   '[]'$ Low back pain ?Hematologic:  '[]'$ Easy bruising  '[]'$ Easy bleeding   '[]'$ Hypercoagulable state   '[]'$ Anemic ?Gastrointestinal:  '[]'$ Diarrhea   '[]'$ Vomiting  '[]'$ Gastroesophageal reflux/heartburn   '[]'$ Difficulty swallowing. ?Genitourinary:  '[]'$ Chronic kidney disease   '[]'$ Difficult urination  '[]'$ Frequent urination   '[]'$ Blood in urine ?Skin:  '[]'$ Rashes   '[]'$ Ulcers  ?Psychological:  '[]'$ History of anxiety   '[]'$  History of major depression. ? ?Physical Examination ? ?There were no vitals filed for this visit. ?There is no height or weight on file to calculate BMI. ?Gen: WD/WN, NAD ?Head: [place]/AT, No temporalis wasting.  ?Ear/Nose/Throat: Hearing grossly intact, nares w/o erythema  or drainage, pinna without lesions ?Eyes: PER, EOMI, sclera nonicteric.  ?Neck: Supple, no gross masses.  No JVD.  ?Pulmonary:  Good air movement, no audible wheezing, no use of accessory muscles.  ?Cardiac: RRR, precordium not hyperdynamic. ?Vascular:  scattered varicosities present bilaterally.  Mild venous stasis changes to the legs bilaterally.  3-4+ soft pitting edema left > right ?Vessel Right Left  ?Radial Palpable Palpable  ?Gastrointestinal: soft, non-distended. No guarding/no peritoneal signs.  ?Musculoskeletal: M/S 5/5 throughout.  No deformity.  ?Neurologic: CN 2-12  intact. Pain and light touch intact in extremities.  Symmetrical.  Speech is fluent. Motor exam as listed above. ?Psychiatric: Judgment intact, Mood & affect appropriate for pt's clinical situation. ?Dermatologic: Venous rashes no ulcers noted.  No changes consistent with cellulitis. ?Lymph : No lichenification or skin changes of chronic lymphedema. ? ?CBC ?Lab Results  ?Component Value Date  ? WBC 11.2 (H) 01/06/2020  ? HGB 8.6 (L) 01/06/2020  ? HCT 27.1 (L) 01/06/2020  ? MCV 89.1 01/06/2020  ? PLT 205 01/06/2020  ? ? ?BMET ?   ?Component Value Date/Time  ? NA 137 02/27/2021 1306  ? K 4.6 02/27/2021 1306  ? CL 101 02/27/2021 1306  ? CO2 26 02/27/2021 1306  ? GLUCOSE 129 (H) 02/27/2021 1306  ? BUN 36 (H) 02/27/2021 1306  ? CREATININE 1.70 (H) 02/27/2021 1306  ? CALCIUM 10.1 02/27/2021 1306  ? GFRNONAA 41 (L) 02/27/2021 1306  ? GFRAA >60 01/06/2020 0521  ? ?CrCl cannot be calculated (Patient's most recent lab result is older than the maximum 21 days allowed.). ? ?COAG ?Lab Results  ?Component Value Date  ? INR 1.2 01/02/2020  ? ? ?Radiology ?No results found. ? ? ?Assessment/Plan ?1. Chronic venous insufficiency ? No surgery or intervention at this point in time.   ? ?I have reviewed my discussion with the patient regarding lymphedema and why it  causes symptoms.  Patient will continue wearing graduated compression stockings class 1 (20-30 mmHg) on a daily basis a prescription was given. The patient is reminded to put the stockings on first thing in the morning and removing them in the evening. The patient is instructed specifically not to sleep in the stockings.  ? ?In addition, behavioral modification throughout the day will be continued.  This will include frequent elevation (such as in a recliner), use of over the counter pain medications as needed and exercise such as walking. ? ?The patient will continue aggressive use of the  lymph pump and I specifically asked her to increase her routine usage.  This will  continue to improve the edema control and prevent sequela such as ulcers and infections.  ? ?The patient will follow-up with me on an annual basis.   ? ?2. Lymphedema ?No surgery or intervention at this point in time.   ? ?I have reviewed my discussion with the patient regarding lymphedema and why it  causes symptoms.  Patient will continue wearing graduated compression stockings class 1 (20-30 mmHg) on a daily basis a prescription was given. The patient is reminded to put the stockings on first thing in the morning and removing them in the evening. The patient is instructed specifically not to sleep in the stockings.  ? ?In addition, behavioral modification throughout the day will be continued.  This will include frequent elevation (such as in a recliner), use of over the counter pain medications as needed and exercise such as walking. ? ?The patient will continue aggressive use of the  lymph pump  and I specifically asked her to increase her routine usage.  This will continue to improve the edema control and prevent sequela such as ulcers and infections.  ? ?The patient will follow-up with me on an annual basis.   ? ?3. Essential hypertension ?Continue antihypertensive medications as already ordered, these medications have been reviewed and there are no changes at this time.  ? ?4. Type 2 diabetes mellitus without complication, without long-term current use of insulin ([place]) ?Continue hypoglycemic medications as already ordered, these medications have been reviewed and there are no changes at this time. ? ?Hgb A1C to be monitored as already arranged by primary service  ? ?5. Hyperlipidemia, unspecified hyperlipidemia type ?Continue statin as ordered and reviewed, no changes at this time  ? ? ? ?Hortencia Pilar, MD ? ?03/11/2022 ?1:13 PM ? ?  ?

## 2022-03-23 ENCOUNTER — Encounter (INDEPENDENT_AMBULATORY_CARE_PROVIDER_SITE_OTHER): Payer: Self-pay | Admitting: Vascular Surgery

## 2022-04-10 NOTE — Progress Notes (Signed)
? ?Cardiology Office Note   ? ?Date:  04/14/2022  ? ?ID:  Grant Mcdaniel, DOB [birth year], MRN [id number] ? ?PCP:  Marguerita Merles, MD  ?Cardiologist:  Kate Sable, MD  ?Electrophysiologist:  None  ? ?Chief Complaint: Follow-up ? ?History of Present Illness:  ? ?Grant Mcdaniel is a 81 y.o. male with history of CAD status post PCI/DES to [place] in 2004 at [facility], CKD stage III, DM2, HTN, HLD, anemia, and chronic venous insufficiency/lymphedema who presents for follow-up of HTN. ? ?LHC in 06/2003 demonstrated 75% OM1 stenosis status post PCI/DES with otherwise nonobstructive disease.  He has not required ischemic evaluation since.  Echo from 12/2019 showed an EF of 55 to 60%, mildly increased LV septal wall thickness, borderline LVH, no regional wall motion abnormalities, normal RV systolic function and ventricular cavity size, and trivial mitral regurgitation. ? ?He was last seen in the office in 02/2022 at which time it was noted outside office had discontinued benazepril and bisoprolol.  His BP remained elevated in the 127N systolic.  Amlodipine was titrated to 10 mg daily. ? ?He comes in doing well from a cardiac perspective and is without symptoms of angina or decompensation.  He has tolerated the titration of amlodipine without issues.  His blood pressure at home is typically in the 170Y to 174B systolic.  He notes prior to the discontinuation of benazepril and bisoprolol his BP was running in the 449Q systolic.  Following the discontinuation of those medications, though prior to the titration of amlodipine, his BP was up into the 759F systolic.  Current BP regimen includes amlodipine 10 mg and HCTZ 12.5 mg.  He has not taken any as needed the furosemide in 1 to 2 years.  No falls or symptoms concerning for bleeding.  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 ? ? ?Labs independently reviewed: ?03/2022 - A1c 7.6, TSH normal ?10/2021 - potassium 4.5, BUN 20, serum creatinine 1.6 ?04/2021 - albumin  4.8, AST/ALT normal, TC 206, TG 245, HDL 38, LDL 125 ?02/2021 - Hgb 12.6 ?01/2020 - Hgb 9.8, PLT 285 ? ?Past Medical History:  ?Diagnosis Date  ? CAD (coronary artery disease)   ? CKD (chronic kidney disease) stage 3, GFR 30-59 ml/min (HCC)   ? Diabetes mellitus without complication ([place])   ? Hypercholesterolemia   ? Hypertension   ? Hypertension   ? Hypothyroid   ? PVD (peripheral vascular disease) ([place])   ? Venous insufficiency   ? ? ?Past Surgical History:  ?Procedure Laterality Date  ? galbladder    ? HIP ARTHROPLASTY Right 12/28/2019  ? Procedure: ARTHROPLASTY BIPOLAR HIP (HEMIARTHROPLASTY);  Surgeon: Thornton Park, MD;  Location: [facility] ORS;  Service: Orthopedics;  Laterality: Right;  ? ? ?Current Medications: ?Current Meds  ?Medication Sig  ? amLODipine (NORVASC) 10 MG tablet Take 1 tablet (10 mg total) by mouth daily.  ? aspirin EC 81 MG tablet Take 81 mg by mouth at bedtime.   ? atorvastatin (LIPITOR) 80 MG tablet Take 1 tablet (80 mg total) by mouth at bedtime.  ? carvedilol (COREG) 6.25 MG tablet Take 1 tablet (6.25 mg total) by mouth 2 (two) times daily.  ? clopidogrel (PLAVIX) 75 MG tablet Take 75 mg by mouth daily.  ? Coenzyme Q10 10 MG capsule Take 10 mg by mouth daily.   ? fluticasone (FLONASE) 50 MCG/ACT nasal spray Place 1 spray into both nostrils daily.  ? furosemide (LASIX) 40 MG tablet Take 1 tablet (40 mg total) by  mouth as needed.  ? hydrochlorothiazide (MICROZIDE) 12.5 MG capsule   ? levothyroxine (SYNTHROID) 75 MCG tablet Take 75 mcg by mouth daily before breakfast.   ? linagliptin (TRADJENTA) 5 MG TABS tablet Take 5 mg by mouth daily.   ? loratadine (CLARITIN) 10 MG tablet Take 10 mg by mouth daily.  ? Multiple Vitamin (MULTIVITAMIN WITH MINERALS) TABS tablet Take 1 tablet by mouth daily.  ? tamsulosin (FLOMAX) 0.4 MG CAPS capsule Take 0.4 mg by mouth daily.   ? vitamin B-12 (CYANOCOBALAMIN) 500 MCG tablet Take 500 mcg by mouth daily.  ? ? ?Allergies:   Sulfa antibiotics  ? ?Social History   ? ?Socioeconomic History  ? Marital status: Married  ?  Spouse name: Not on file  ? Number of children: Not on file  ? Years of education: Not on file  ? Highest education level: Not on file  ?Occupational History  ? Not on file  ?Tobacco Use  ? Smoking status: Former  ?  Types: Cigarettes  ? Smokeless tobacco: Current  ?  Types: Chew  ?Vaping Use  ? Vaping Use: Never used  ?Substance and Sexual Activity  ? Alcohol use: Never  ? Drug use: Never  ? Sexual activity: Not on file  ?Other Topics Concern  ? Not on file  ?Social History Narrative  ? Not on file  ? ?Social Determinants of Health  ? ?Financial Resource Strain: Not on file  ?Food Insecurity: Not on file  ?Transportation Needs: Not on file  ?Physical Activity: Not on file  ?Stress: Not on file  ?Social Connections: Not on file  ?  ? ?Family History:  ?The patient's family history includes CAD in his father; Cancer in his mother; Hypertension in his brother. ? ?ROS:   ?12 point review of systems is negative unless otherwise noted in the HPI. ? ? ?EKGs/Labs/Other Studies Reviewed:   ? ?Studies reviewed were summarized above. The additional studies were reviewed today: ? ?2D echo 12/29/2019: ? 1. Left ventricular ejection fraction, by visual estimation, is 55 to  ?60%. The left ventricle has normal function. Left ventricular septal wall  ?thickness was mildly increased. There is borderline left ventricular  ?hypertrophy.  ? 2. The left ventricle has no regional wall motion abnormalities.  ? 3. Global right ventricle has normal systolic function.The right  ?ventricular size is normal. No increase in right ventricular wall  ?thickness.  ? 4. Left atrial size was normal.  ? 5. Right atrial size was normal.  ? 6. The mitral valve is grossly normal. Trivial mitral valve  ?regurgitation.  ? 7. The tricuspid valve is grossly normal.  ? 8. The aortic valve is tricuspid. Aortic valve regurgitation is not  ?visualized.  ? 9. The pulmonic valve was grossly normal. Pulmonic  valve regurgitation is  ?not visualized.  ?10. The atrial septum is grossly normal. ?__________ ? ?LHC 06/2003: ?CORONARY ARTERIOGRAMS  ? Branch                    Stenosis       Lesion Type  TIMI Flow  ? ------------------------  -------------  -----------  ----------  ? Left Circumflex  ?    *OM1                   75%            Diffuse  ? Left Anterior Descending  ?    Mid LAD                <  25%           Tubular  ?    Dist LAD               50%            Tubular  ?    Dist LAD               25%            Tubular  ?    D1                     <25%           Tubular  ? Right Coronary Artery     Not evaluated  ? Left Main                 normal  ? * Denotes significant lesion  ? ? Tree Comment:  75% OM1 was directly stented with a 2.5x43m Cypher DES at  ?   8 atm and post-dilated with a 2.25x873mQuantum Maverick to 16 atm.  ?   Excellent result.  ? ?INTERVENTIONAL CATHETERIZATION  ? Lesion #1:  OM1 75%  (pre)  -> <25%  (interval)  -> Normal (post)  ? ? ?EKG:  EKG is not ordered today.   ? ?Recent Labs: ?No results found for requested labs within last 8760 hours.  ?Recent Lipid Panel ?   ?Component Value Date/Time  ? CHOL 113 12/30/2019 0341  ? TRIG 129 12/30/2019 0341  ? HDL 30 (L) 12/30/2019 0341  ? CHOLHDL 3.8 12/30/2019 0341  ? VLDL 26 12/30/2019 0341  ? LDS[place]o7 12/30/2019 0341  ? ? ?PHYSICAL EXAM:   ? ?VS:  BP (!) 160/80 (BP Location: Right Arm, Patient Position: Sitting, Cuff Size: Normal)   Pulse 98   Ht '6\' 2"'  (1.88 m)   Wt 231 lb (104.8 kg)   SpO2 98%   BMI 29.66 kg/m?   BMI: Body mass index is 29.66 kg/m?. ? ?Physical Exam ?Vitals reviewed.  ?Constitutional:   ?   Appearance: He is well-developed.  ?HENT:  ?   Head: Normocephalic and atraumatic.  ?Eyes:  ?   General:     ?   Right eye: No discharge.     ?   Left eye: No discharge.  ?Neck:  ?   Vascular: No JVD.  ?Cardiovascular:  ?   Rate and Rhythm: Normal rate and regular rhythm.  ?   Heart sounds: Normal heart sounds, S1 normal and S2 normal.  Heart sounds not distant. No midsystolic click and no opening snap. No murmur heard. ?  No friction rub.  ?Pulmonary:  ?   Effort: Pulmonary effort is normal. No respiratory distress.  ?   Breath sounds: NoConstance Holster

## 2022-04-14 ENCOUNTER — Encounter: Payer: Self-pay | Admitting: Physician Assistant

## 2022-04-14 ENCOUNTER — Ambulatory Visit (INDEPENDENT_AMBULATORY_CARE_PROVIDER_SITE_OTHER): Payer: 59 | Admitting: Physician Assistant

## 2022-04-14 VITALS — BP 160/80 | HR 98 | Ht 74.0 in | Wt 231.0 lb

## 2022-04-14 DIAGNOSIS — I10 Essential (primary) hypertension: Secondary | ICD-10-CM

## 2022-04-14 DIAGNOSIS — E785 Hyperlipidemia, unspecified: Secondary | ICD-10-CM | POA: Diagnosis not present

## 2022-04-14 DIAGNOSIS — I872 Venous insufficiency (chronic) (peripheral): Secondary | ICD-10-CM

## 2022-04-14 DIAGNOSIS — D649 Anemia, unspecified: Secondary | ICD-10-CM

## 2022-04-14 DIAGNOSIS — N183 Chronic kidney disease, stage 3 unspecified: Secondary | ICD-10-CM

## 2022-04-14 DIAGNOSIS — I2510 Atherosclerotic heart disease of native coronary artery without angina pectoris: Secondary | ICD-10-CM | POA: Diagnosis not present

## 2022-04-14 MED ORDER — CARVEDILOL 6.25 MG PO TABS: 6.2500 mg | ORAL_TABLET | Freq: Two times a day (BID) | ORAL | 1 refills | Status: DC

## 2022-04-14 NOTE — Patient Instructions (Signed)
Medication Instructions:  ? ?Your physician has recommended you make the following change in your medication:  ? ?START Coreg (carvedilol) 6.25 mg TWICE daily  ? ?*If you need a refill on your cardiac medications before your next appointment, please call your pharmacy* ? ? ?Lab Work: ? ?None ordered ? ?Testing/Procedures: ? ?None ordered ? ? ?Follow-Up: ?At [facility], you and your health needs are our priority.  As part of our continuing mission to provide you with exceptional heart care, we have created designated Provider Care Teams.  These Care Teams include your primary Cardiologist (physician) and Advanced Practice Providers (APPs -  Physician Assistants and Nurse Practitioners) who all work together to provide you with the care you need, when you need it. ? ?We recommend signing up for the patient portal called "MyChart".  Sign up information is provided on this After Visit Summary.  MyChart is used to connect with patients for Virtual Visits (Telemedicine).  Patients are able to view lab/test results, encounter notes, upcoming appointments, etc.  Non-urgent messages can be sent to your provider as well.   ?To learn more about what you can do with MyChart, go to [email or web address].   ? ?Your next appointment:   ?1 month(s) ? ?The format for your next appointment:   ?In Person ? ?Provider:   ?You will see one of the following Advanced Practice Providers on your designated Care Team:   ? ?Christell Faith, PA-C ? ?Important Information About Sugar ? ? ? ? ? ? ?

## 2022-05-10 NOTE — Progress Notes (Unsigned)
Cardiology Office Note    Date:  05/12/2022   ID:  Grant Mcdaniel, Grant Mcdaniel [birth year], MRN [id number]  PCP:  Marguerita Merles, MD  Cardiologist:  Kate Sable, MD  Electrophysiologist:  None   Chief Complaint: Follow-up  History of Present Illness:   Grant Mcdaniel is a 81 y.o. male with history of CAD status post PCI/DES to [place] in 2004 at [facility], CKD stage III, DM2, HTN, HLD, anemia, and chronic venous insufficiency/lymphedema who presents for follow-up of HTN.   LHC in 06/2003 demonstrated 75% OM1 stenosis status post PCI/DES with otherwise nonobstructive disease.  He has not required ischemic evaluation since.  Echo from 12/2019 showed an EF of 55 to 60%, mildly increased LV septal wall thickness, borderline LVH, no regional wall motion abnormalities, normal RV systolic function and ventricular cavity size, and trivial mitral regurgitation.   He was seen in the office in 02/2022 at which time it was noted outside office had discontinued benazepril and bisoprolol.  His BP remained elevated in the 409W systolic.  Amlodipine was titrated to 10 mg daily.  He was last seen in the office on 04/14/2022 and was without symptoms of angina or decompensation.  Blood pressures were typically in the 119J to 478G systolic.  He was started on carvedilol 6.25 mg twice daily and continued on amlodipine along with HCTZ.  He comes in doing well from a cardiac perspective and is without symptoms of angina or decompensation.  Since he was last seen, he has tolerated the addition of carvedilol without significant issues.  He reports his blood pressures are typically in the 130s to 140s.  No episodes of significant hypo or hypertension.  No chest pain, dyspnea, palpitations, dizziness, presyncope, or syncope.  He continues to ambulate with a walker.  He has not had any falls since he was last seen.  His lower extremity swelling is stable.  He has not needed any as needed furosemide since he was last seen.  His  functional status continues to be limited secondary to arthritic knees.  Overall, he feels like he is doing well from a cardiac perspective.   Labs independently reviewed: 03/2022 - A1c 7.6, TSH normal 10/2021 - potassium 4.5, BUN 20, serum creatinine 1.6 04/2021 - albumin 4.8, AST/ALT normal, TC 206, TG 245, HDL 38, LDL 125 02/2021 - Hgb 12.6 01/2020 - Hgb 9.8, PLT 285  Past Medical History:  Diagnosis Date   CAD (coronary artery disease)    Chronic venous insufficiency 12/06/2020   CKD (chronic kidney disease) stage 3, GFR 30-59 ml/min (HCC)    CKD stage 3 due to type 2 diabetes mellitus ([place])    Closed hip fracture requiring operative repair, right, sequela 12/27/2019   Coagulation disorder ([place]) 08/24/6212   Complication of Foley catheter ([place])    Diabetes ([place]) 05/26/2020   Diabetes mellitus without complication ([place])    Enlarged prostate 05/26/2020   Essential hypertension    Hypercholesterolemia    Hyperlipidemia    Hypothyroidism    Leukocytosis    Lymphedema 03/11/2022   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   aspirin EC 81 MG tablet Take 81 mg by mouth at bedtime.    atorvastatin (LIPITOR) 80 MG tablet Take 1 tablet (80 mg total) by mouth at bedtime.   carvedilol (COREG) 12.5 MG tablet Take 1 tablet (12.5 mg total) by mouth 2 (two) times daily.   clopidogrel (PLAVIX) 75 MG tablet Take 75 mg by mouth daily.   Coenzyme Q10 10 MG capsule Take 10 mg by mouth daily.     fluticasone (FLONASE) 50 MCG/ACT nasal spray Place 1 spray into both nostrils as needed for allergies or rhinitis.   furosemide (LASIX) 40 MG tablet Take 1 tablet (40 mg total) by mouth as needed.   hydrochlorothiazide (MICROZIDE) 12.5 MG capsule Take 12.5 mg by mouth daily.   levothyroxine (SYNTHROID) 75 MCG tablet Take 75 mcg by mouth daily before breakfast.    linagliptin (TRADJENTA) 5 MG TABS tablet Take 5 mg by mouth daily.    loratadine (CLARITIN) 10 MG tablet Take 10 mg by mouth daily.   Multiple Vitamin (MULTIVITAMIN WITH MINERALS) TABS tablet Take 1 tablet by mouth daily.   tamsulosin (FLOMAX) 0.4 MG CAPS capsule Take 0.4 mg by mouth daily.    vitamin B-12 (CYANOCOBALAMIN) 500 MCG tablet Take 500 mcg by mouth daily.   [DISCONTINUED] amLODipine (NORVASC) 10 MG tablet Take 1 tablet (10 mg total) by mouth daily.   [DISCONTINUED] carvedilol (COREG) 6.25 MG tablet Take 1 tablet (6.25 mg total) by mouth 2 (two) times daily.    Allergies:   Sulfa antibiotics   Social History   Socioeconomic History   Marital status: Married    Spouse name: Not on file   Number of children: Not on file   Years of education: Not on file   Highest education level: Not on file  Occupational History   Not on file  Tobacco Use   Smoking status: Former    Types: Cigarettes   Smokeless tobacco: Current    Types: Chew  Vaping Use   Vaping Use: Never used  Substance and Sexual Activity   Alcohol use: Never   Drug use: Never   Sexual activity: Not on file  Other Topics Concern   Not on file  Social History Narrative   Not on file   Social Determinants of Health   Financial Resource Strain: Not on file  Food Insecurity: Not on file  Transportation Needs: Not on file  Physical Activity: Not on file  Stress: Not on file  Social Connections: Not on file     Family History:  The patient's family history includes CAD in his father; Cancer in his mother; Hypertension in his brother.  ROS:    12-point review of systems is negative unless otherwise noted in the HPI.   EKGs/Labs/Other Studies Reviewed:    Studies reviewed were summarized above. The additional studies were reviewed today:  2D echo 12/29/2019:  1. Left ventricular ejection fraction, by visual estimation, is 55 to  60%. The left ventricle has normal function. Left ventricular septal wall  thickness was mildly increased. There is borderline left ventricular  hypertrophy.   2. The left ventricle has no regional wall motion abnormalities.   3. Global right ventricle has normal systolic function.The right  ventricular size is normal. No increase in right ventricular wall  thickness.   4. Left atrial size was normal.   5.  Right atrial size was normal.   6. The mitral valve is grossly normal. Trivial mitral valve  regurgitation.   7. The tricuspid valve is grossly normal.   8. The aortic valve is tricuspid. Aortic valve regurgitation is not  visualized.   9. The pulmonic valve was grossly normal. Pulmonic valve regurgitation is  not visualized.  10. The atrial septum is grossly normal. __________   LHC 06/2003: CORONARY ARTERIOGRAMS   Branch                    Stenosis       Lesion Type  TIMI Flow   ------------------------  -------------  -----------  ----------   Left Circumflex      *OM1                   75%            Diffuse   Left Anterior Descending      Mid LAD                <25%           Tubular      Dist LAD               50%            Tubular      Dist LAD               25%            Tubular      D1                     <25%           Tubular   Right Coronary Artery     Not evaluated   Left Main                 normal   * Denotes significant lesion    Tree Comment:  75% OM1 was directly stented with a 2.5x67m Cypher DES at     8 atm and post-dilated with a 2.25x820mQuantum Maverick to 16 atm.     Excellent result.   INTERVENTIONAL CATHETERIZATION   Lesion #1:  OM1 75%  (pre)  -> <25%   (interval)  -> Normal (post)    EKG:  EKG is ordered today.  The EKG ordered today demonstrates NSR with rare PAC, 83 bpm, no acute ST-T changes  Recent Labs: No results found for requested labs within last 8760 hours.  Recent Lipid Panel    Component Value Date/Time   CHOL 113 12/30/2019 0341   TRIG 129 12/30/2019 0341   HDL 30 (L) 12/30/2019 0341   CHOLHDL 3.8 12/30/2019 0341   VLDL 26 12/30/2019 0341   LDLCALC 57 12/30/2019 0341    PHYSICAL EXAM:    VS:  BP 136/64   Pulse 83   Ht '6\' 2"'  (1.88 m)   Wt 236 lb 6.4 oz (107.2 kg)   SpO2 96%   BMI 30.35 kg/m   BMI: Body mass index is 30.35 kg/m.  Physical Exam Vitals reviewed.  Constitutional:      Appearance: He is well-developed.  HENT:     Head: Normocephalic and atraumatic.  Eyes:     General:        Right eye: No discharge.        Left eye: No discharge.  Neck:     Vascular:  No JVD.  Cardiovascular:     Rate and Rhythm: Normal rate and regular rhythm.     Heart sounds: Normal heart sounds, S1 normal and S2 normal. Heart sounds not distant. No midsystolic click and no opening snap. No murmur heard.   No friction rub.  Pulmonary:     Effort: Pulmonary effort is normal. No respiratory distress.     Breath sounds: Normal breath sounds. No decreased breath sounds, wheezing or rales.  Chest:     Chest wall: No tenderness.  Abdominal:     General: There is no distension.     Palpations: Abdomen is soft.     Tenderness: There is no abdominal tenderness.  Musculoskeletal:     Cervical back: Normal range of motion.     Right lower leg: Edema present.     Left lower leg: Edema present.  Skin:    General: Skin is warm and dry.     Nails: There is no clubbing.  Neurological:     Mental Status: He is alert and oriented to person, place, and time.  Psychiatric:        Speech: Speech normal.        Behavior: Behavior normal.        Thought Content: Thought content normal.        Judgment: Judgment normal.    Wt  Readings from Last 3 Encounters:  05/12/22 236 lb 6.4 oz (107.2 kg)  04/14/22 231 lb (104.8 kg)  03/11/22 232 lb (105.2 kg)     ASSESSMENT & PLAN:   CAD involving the native coronary arteries without angina: He continues to do well without symptoms concerning for angina or decompensation.  Continue current medical therapy including aspirin, clopidogrel, amlodipine, carvedilol, and atorvastatin.  No indication for further ischemic testing at this time.  HTN: Blood pressure is improved following the addition of carvedilol.  An effort to improve with his lower extremity swelling, we will decrease amlodipine to 5 mg daily with plans to ultimately discontinue this medication at his next visit.  To compensate for this, we will titrate carvedilol to 12.5 mg twice daily.  He will otherwise continue HCTZ.  Low-sodium diet is encouraged.  HLD: LDL 125 with goal being less than 70.  Now on atorvastatin 80 mg daily.  Update fasting lipid panel at next visit.  CKD stage III: Followed by nephrology.  Anemia: Possibly of chronic disease.  No symptoms concerning for bleeding.  Follow-up with PCP.  Chronic venous insufficiency/lymphedema: Followed by vascular surgery.  Look to taper off amlodipine as outlined above.    Disposition: F/u with Dr. Garen Lah or an APP in 2 months.   Medication Adjustments/Labs and Tests Ordered: Current medicines are reviewed at length with the patient today.  Concerns regarding medicines are outlined above. Medication changes, Labs and Tests ordered today are summarized above and listed in the Patient Instructions accessible in Encounters.   Signed, Christell Faith, PA-C 05/12/2022 3:09 PM     [facility] [address] [place] [address] [place], [place] [address] [phone]

## 2022-05-11 DIAGNOSIS — N183 Chronic kidney disease, stage 3 unspecified: Secondary | ICD-10-CM | POA: Insufficient documentation

## 2022-05-11 DIAGNOSIS — E119 Type 2 diabetes mellitus without complications: Secondary | ICD-10-CM | POA: Insufficient documentation

## 2022-05-11 DIAGNOSIS — E7800 Pure hypercholesterolemia, unspecified: Secondary | ICD-10-CM | POA: Insufficient documentation

## 2022-05-11 DIAGNOSIS — I2510 Atherosclerotic heart disease of native coronary artery without angina pectoris: Secondary | ICD-10-CM | POA: Insufficient documentation

## 2022-05-12 ENCOUNTER — Encounter: Payer: Self-pay | Admitting: Physician Assistant

## 2022-05-12 ENCOUNTER — Ambulatory Visit (INDEPENDENT_AMBULATORY_CARE_PROVIDER_SITE_OTHER): Payer: 59 | Admitting: Physician Assistant

## 2022-05-12 VITALS — BP 136/64 | HR 83 | Ht 74.0 in | Wt 236.4 lb

## 2022-05-12 DIAGNOSIS — I10 Essential (primary) hypertension: Secondary | ICD-10-CM | POA: Diagnosis not present

## 2022-05-12 DIAGNOSIS — N183 Chronic kidney disease, stage 3 unspecified: Secondary | ICD-10-CM | POA: Diagnosis not present

## 2022-05-12 DIAGNOSIS — I2510 Atherosclerotic heart disease of native coronary artery without angina pectoris: Secondary | ICD-10-CM

## 2022-05-12 DIAGNOSIS — D649 Anemia, unspecified: Secondary | ICD-10-CM | POA: Diagnosis not present

## 2022-05-12 DIAGNOSIS — I872 Venous insufficiency (chronic) (peripheral): Secondary | ICD-10-CM

## 2022-05-12 MED ORDER — AMLODIPINE BESYLATE 5 MG PO TABS: 5.0000 mg | ORAL_TABLET | Freq: Every day | ORAL | 3 refills | Status: DC

## 2022-05-12 MED ORDER — CARVEDILOL 12.5 MG PO TABS: 12.5000 mg | ORAL_TABLET | Freq: Two times a day (BID) | ORAL | 3 refills | Status: DC

## 2022-05-12 NOTE — Patient Instructions (Signed)
Medication Instructions:  Your physician has recommended you make the following change in your medication:   DECREASE Amlodipine to 5 mg once daily  INCREASE Carvedilol to 12.5 mg twice a day  *If you need a refill on your cardiac medications before your next appointment, please call your pharmacy*   Lab Work: None  If you have labs (blood work) drawn today and your tests are completely normal, you will receive your results only by: [place] (if you have MyChart) OR A paper copy in the mail If you have any lab test that is abnormal or we need to change your treatment, we will call you to review the results.   Testing/Procedures: None   Follow-Up: At [facility], you and your health needs are our priority.  As part of our continuing mission to provide you with exceptional heart care, we have created designated Provider Care Teams.  These Care Teams include your primary Cardiologist (physician) and Advanced Practice Providers (APPs -  Physician Assistants and Nurse Practitioners) who all work together to provide you with the care you need, when you need it.  We recommend signing up for the patient portal called "MyChart".  Sign up information is provided on this After Visit Summary.  MyChart is used to connect with patients for Virtual Visits (Telemedicine).  Patients are able to view lab/test results, encounter notes, upcoming appointments, etc.  Non-urgent messages can be sent to your provider as well.   To learn more about what you can do with MyChart, go to [email or web address].    Your next appointment:   2 month(s)  The format for your next appointment:   In Person  Provider:   Christell Faith, PA-C       Important Information About Sugar

## 2022-06-03 ENCOUNTER — Ambulatory Visit: Payer: 59 | Admitting: Podiatry

## 2022-06-10 ENCOUNTER — Ambulatory Visit: Payer: 59 | Admitting: Podiatry

## 2022-07-01 ENCOUNTER — Ambulatory Visit: Payer: 59 | Admitting: Podiatry

## 2022-07-08 NOTE — Progress Notes (Deleted)
Cardiology Office Note    Date:  07/08/2022   ID:  Grant Mcdaniel 16-Mar-1941, MRN [id number]  PCP:  Marguerita Merles, MD  Cardiologist:  Kate Sable, MD  Electrophysiologist:  None   Chief Complaint: Follow-up  History of Present Illness:   Grant Mcdaniel is a 81 y.o. male with history of CAD status post PCI/DES to [place] in 2004 at [facility], CKD stage III, DM2, HTN, HLD, anemia, and chronic venous insufficiency/lymphedema who presents for follow-up of HTN.   LHC in 06/2003 demonstrated 75% OM1 stenosis status post PCI/DES with otherwise nonobstructive disease.  He has not required ischemic evaluation since.  Echo from 12/2019 showed an EF of 55 to 60%, mildly increased LV septal wall thickness, borderline LVH, no regional wall motion abnormalities, normal RV systolic function and ventricular cavity size, and trivial mitral regurgitation.   He was seen in the office in 02/2022 at which time it was noted outside office had discontinued benazepril and bisoprolol.  His BP remained elevated in the 476L systolic.  Amlodipine was titrated to 10 mg daily.  He was seen in the office on 04/14/2022 and was without symptoms of angina or decompensation.  Blood pressures were typically in the 465K to 354S systolic.  He was started on carvedilol 6.25 mg twice daily and continued on amlodipine along with HCTZ.  He was last seen in the office in 04/2022 and was without symptoms of angina or decompensation.  Amlodipine was decreased to 5 mg daily with further plans to ultimately discontinue, and carvedilol was titrated to 12.5 mg twice daily.  ***   Labs independently reviewed: 03/2022 - A1c 7.6, TSH normal 10/2021 - potassium 4.5, BUN 20, serum creatinine 1.6 04/2021 - albumin 4.8, AST/ALT normal, TC 206, TG 245, HDL 38, LDL 125 02/2021 - Hgb 12.6 01/2020 - Hgb 9.8, PLT 285  Past Medical History:  Diagnosis Date   CAD (coronary artery disease)    Chronic venous insufficiency 12/06/2020   CKD (chronic  kidney disease) stage 3, GFR 30-59 ml/min (HCC)    CKD stage 3 due to type 2 diabetes mellitus ([place])    Closed hip fracture requiring operative repair, right, sequela 12/27/2019   Coagulation disorder ([place]) 04/24/8126   Complication of Foley catheter ([place])    Diabetes ([place]) 05/26/2020   Diabetes mellitus without complication ([place])    Enlarged prostate 05/26/2020   Essential hypertension    Hypercholesterolemia    Hyperlipidemia    Hypothyroidism    Leukocytosis    Lymphedema 03/11/2022   Monoclonal gammopathy 12/19/2019   Pain due to onychomycosis of toenails of both feet 05/26/2020   Preop examination    PVD (peripheral vascular disease) [facility])     Past Surgical History:  Procedure Laterality Date   galbladder     HIP ARTHROPLASTY Right 12/28/2019   Procedure: ARTHROPLASTY BIPOLAR HIP (HEMIARTHROPLASTY);  Surgeon: Thornton Park, MD;  Location: [facility] ORS;  Service: Orthopedics;  Laterality: Right;    Current Medications: No outpatient medications have been marked as taking for the 07/12/22 encounter (Appointment) with Rise Mu, PA-C.    Allergies:   Sulfa antibiotics   Social History   Socioeconomic History   Marital status: Married    Spouse name: Not on file   Number of children: Not on file   Years of education: Not on file   Highest education level: Not on file  Occupational History   Not on file  Tobacco Use   Smoking status: Former  Types: Cigarettes   Smokeless tobacco: Current    Types: Chew  Vaping Use   Vaping Use: Never used  Substance and Sexual Activity   Alcohol use: Never   Drug use: Never   Sexual activity: Not on file  Other Topics Concern   Not on file  Social History Narrative   Not on file   Social Determinants of Health   Financial Resource Strain: Not on file  Food Insecurity: Not on file  Transportation Needs: Not on file  Physical Activity: Not on file  Stress: Not on file  Social Connections: Not on file     Family History:  The  patient's family history includes CAD in his father; Cancer in his mother; Hypertension in his brother.  ROS:   ROS   EKGs/Labs/Other Studies Reviewed:    Studies reviewed were summarized above. The additional studies were reviewed today:  2D echo 12/29/2019:  1. Left ventricular ejection fraction, by visual estimation, is 55 to  60%. The left ventricle has normal function. Left ventricular septal wall  thickness was mildly increased. There is borderline left ventricular  hypertrophy.   2. The left ventricle has no regional wall motion abnormalities.   3. Global right ventricle has normal systolic function.The right  ventricular size is normal. No increase in right ventricular wall  thickness.   4. Left atrial size was normal.   5. Right atrial size was normal.   6. The mitral valve is grossly normal. Trivial mitral valve  regurgitation.   7. The tricuspid valve is grossly normal.   8. The aortic valve is tricuspid. Aortic valve regurgitation is not  visualized.   9. The pulmonic valve was grossly normal. Pulmonic valve regurgitation is  not visualized.  10. The atrial septum is grossly normal. __________   LHC 06/2003: CORONARY ARTERIOGRAMS   Branch                    Stenosis       Lesion Type  TIMI Flow   ------------------------  -------------  -----------  ----------   Left Circumflex      *OM1                   75%            Diffuse   Left Anterior Descending      Mid LAD                <25%           Tubular      Dist LAD               50%            Tubular      Dist LAD               25%            Tubular      D1                     <25%           Tubular   Right Coronary Artery     Not evaluated   Left Main                 normal   * Denotes significant lesion    Tree Comment:  75% OM1 was directly stented with a 2.5x55m Cypher DES at  8 atm and post-dilated with a 2.25x37m Quantum Maverick to 16 atm.     Excellent result.   INTERVENTIONAL  CATHETERIZATION   Lesion #1:  OM1 75%  (pre)  -> <25%  (interval)  -> Normal (post)    EKG:  EKG is ordered today.  The EKG ordered today demonstrates ***  Recent Labs: No results found for requested labs within last 365 days.  Recent Lipid Panel    Component Value Date/Time   CHOL 113 12/30/2019 0341   TRIG 129 12/30/2019 0341   HDL 30 (L) 12/30/2019 0341   CHOLHDL 3.8 12/30/2019 0341   VLDL 26 12/30/2019 0341   LDLCALC 57 12/30/2019 0341    PHYSICAL EXAM:    VS:  There were no vitals taken for this visit.  BMI: There is no height or weight on file to calculate BMI.  Physical Exam  Wt Readings from Last 3 Encounters:  05/12/22 236 lb 6.4 oz (107.2 kg)  04/14/22 231 lb (104.8 kg)  03/11/22 232 lb (105.2 kg)     ASSESSMENT & PLAN:   CAD involving native coronary arteries without***angina:  HTN: Blood pressure  HLD: LDL 125 with goal being less than 70.  CKD stage III:  Anemia:  Chronic recent insufficiency/lymphedema:   {Are you ordering a CV Procedure (e.g. stress test, cath, DCCV, TEE, etc)?   Press F2        :[id number]}    Disposition: F/u with Dr. AGaren Lahor an APP in ***.   Medication Adjustments/Labs and Tests Ordered: Current medicines are reviewed at length with the patient today.  Concerns regarding medicines are outlined above. Medication changes, Labs and Tests ordered today are summarized above and listed in the Patient Instructions accessible in Encounters.   Signed, RChristell Faith PA-C 07/08/2022 11:15 AM     [facility] [address] [place] [phone]

## 2022-07-12 ENCOUNTER — Encounter: Payer: Self-pay | Admitting: Physician Assistant

## 2022-07-12 ENCOUNTER — Ambulatory Visit: Payer: 59 | Admitting: Physician Assistant

## 2022-07-19 ENCOUNTER — Ambulatory Visit: Payer: 59 | Admitting: Podiatry

## 2022-07-19 NOTE — Progress Notes (Unsigned)
Cardiology Office Note    Date:  07/20/2022   ID:  Mcdaniel, Grant [birth year], MRN [id number]  PCP:  Marguerita Merles, MD  Cardiologist:  Kate Sable, MD  Electrophysiologist:  None   Chief Complaint: Follow up  History of Present Illness:   Grant Mcdaniel is a 81 y.o. male with history of CAD status post PCI/DES to [place] in 2004 at [facility], CKD stage III, DM2, HTN, HLD, anemia, and chronic venous insufficiency/lymphedema who presents for follow-up of HTN.   LHC in 06/2003 demonstrated 75% OM1 stenosis status post PCI/DES with otherwise nonobstructive disease.  He has not required ischemic evaluation since.  Echo from 12/2019 showed an EF of 55 to 60%, mildly increased LV septal wall thickness, borderline LVH, no regional wall motion abnormalities, normal RV systolic function and ventricular cavity size, and trivial mitral regurgitation.   He was seen in the office in 02/2022 at which time it was noted outside office had discontinued benazepril and bisoprolol.  His BP remained elevated in the 008Q systolic.  Amlodipine was titrated to 10 mg daily.  He was seen in the office on 04/14/2022 and was without symptoms of angina or decompensation.  Blood pressures were typically in the 761P to 509T systolic.  He was started on carvedilol 6.25 mg twice daily and continued on amlodipine along with HCTZ.  He was last seen in the office in 04/2022 and was without symptoms of angina or decompensation.  Amlodipine was decreased to 5 mg daily with further plans to ultimately discontinue, and carvedilol was titrated to 12.5 mg twice daily.  He comes in doing well from a cardiac perspective, and is without symptoms of angina or decompensation.  With the decreasing of amlodipine, he has noted some improvement in his lower extremity swelling.  Following titration of carvedilol he notes his blood pressure at home is typically running in the 267T to 245Y systolic, when he remembers to check it.  No dyspnea,  palpitations, dizziness, presyncope, or syncope.  He has stable two-pillow orthopnea.  He continues to ambulate with a walker.  He has not needed any as needed furosemide.  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  Overall, he feels like he is doing well from a cardiac perspective.   Labs independently reviewed: 03/2022 - A1c 7.6, TSH normal 10/2021 - potassium 4.5, BUN 20, serum creatinine 1.6 04/2021 - albumin 4.8, AST/ALT normal, TC 206, TG 245, HDL 38, LDL 125 02/2021 - Hgb 12.6 01/2020 - Hgb 9.8, PLT 285   Past Medical History:  Diagnosis Date   CAD (coronary artery disease)    Chronic venous insufficiency 12/06/2020   CKD (chronic kidney disease) stage 3, GFR 30-59 ml/min (HCC)    CKD stage 3 due to type 2 diabetes mellitus ([place])    Closed hip fracture requiring operative repair, right, sequela 12/27/2019   Coagulation disorder ([place]) 0/08/9832   Complication of Foley catheter ([place])    Diabetes ([place]) 05/26/2020   Diabetes mellitus without complication ([place])    Enlarged prostate 05/26/2020   Essential hypertension    Hypercholesterolemia    Hyperlipidemia    Hypothyroidism    Leukocytosis    Lymphedema 03/11/2022   Monoclonal gammopathy 12/19/2019   Pain due to onychomycosis of toenails of both feet 05/26/2020   Preop examination    PVD (peripheral vascular disease) ([place])     Past Surgical History:  Procedure Laterality Date   galbladder     HIP ARTHROPLASTY Right  12/28/2019   Procedure: ARTHROPLASTY BIPOLAR HIP (HEMIARTHROPLASTY);  Surgeon: Thornton Park, MD;  Location: [facility] ORS;  Service: Orthopedics;  Laterality: Right;    Current Medications: Current Meds  Medication Sig   aspirin EC 81 MG tablet Take 81 mg by mouth at bedtime.    atorvastatin (LIPITOR) 80 MG tablet Take 1 tablet (80 mg total) by mouth at bedtime.   clopidogrel (PLAVIX) 75 MG tablet Take 75 mg by mouth daily.   Coenzyme Q10 10 MG capsule Take 10 mg by mouth daily.    fluticasone  (FLONASE) 50 MCG/ACT nasal spray Place 1 spray into both nostrils as needed for allergies or rhinitis.   furosemide (LASIX) 40 MG tablet Take 1 tablet (40 mg total) by mouth as needed.   hydrochlorothiazide (MICROZIDE) 12.5 MG capsule Take 12.5 mg by mouth daily.   levothyroxine (SYNTHROID) 75 MCG tablet Take 75 mcg by mouth daily before breakfast.    linagliptin (TRADJENTA) 5 MG TABS tablet Take 5 mg by mouth daily.    loratadine (CLARITIN) 10 MG tablet Take 10 mg by mouth daily.   Multiple Vitamin (MULTIVITAMIN WITH MINERALS) TABS tablet Take 1 tablet by mouth daily.   tamsulosin (FLOMAX) 0.4 MG CAPS capsule Take 0.4 mg by mouth daily.    vitamin B-12 (CYANOCOBALAMIN) 500 MCG tablet Take 500 mcg by mouth daily.   [DISCONTINUED] amLODipine (NORVASC) 5 MG tablet Take 1 tablet (5 mg total) by mouth daily.   [DISCONTINUED] carvedilol (COREG) 12.5 MG tablet Take 1 tablet (12.5 mg total) by mouth 2 (two) times daily.    Allergies:   Sulfa antibiotics   Social History   Socioeconomic History   Marital status: Married    Spouse name: Not on file   Number of children: Not on file   Years of education: Not on file   Highest education level: Not on file  Occupational History   Not on file  Tobacco Use   Smoking status: Former    Types: Cigarettes   Smokeless tobacco: Current    Types: Chew  Vaping Use   Vaping Use: Never used  Substance and Sexual Activity   Alcohol use: Never   Drug use: Never   Sexual activity: Not on file  Other Topics Concern   Not on file  Social History Narrative   Not on file   Social Determinants of Health   Financial Resource Strain: Not on file  Food Insecurity: Not on file  Transportation Needs: Not on file  Physical Activity: Not on file  Stress: Not on file  Social Connections: Not on file     Family History:  The patient's family history includes CAD in his father; Cancer in his mother; Hypertension in his brother.  ROS:   12-point review  of systems is negative unless otherwise noted in the HPI.   EKGs/Labs/Other Studies Reviewed:    Studies reviewed were summarized above. The additional studies were reviewed today:  2D echo 12/29/2019:  1. Left ventricular ejection fraction, by visual estimation, is 55 to  60%. The left ventricle has normal function. Left ventricular septal wall  thickness was mildly increased. There is borderline left ventricular  hypertrophy.   2. The left ventricle has no regional wall motion abnormalities.   3. Global right ventricle has normal systolic function.The right  ventricular size is normal. No increase in right ventricular wall  thickness.   4. Left atrial size was normal.   5. Right atrial size was normal.   6. The  mitral valve is grossly normal. Trivial mitral valve  regurgitation.   7. The tricuspid valve is grossly normal.   8. The aortic valve is tricuspid. Aortic valve regurgitation is not  visualized.   9. The pulmonic valve was grossly normal. Pulmonic valve regurgitation is  not visualized.  10. The atrial septum is grossly normal. __________   LHC 06/2003: CORONARY ARTERIOGRAMS   Branch                    Stenosis       Lesion Type  TIMI Flow   ------------------------  -------------  -----------  ----------   Left Circumflex      *OM1                   75%            Diffuse   Left Anterior Descending      Mid LAD                <25%           Tubular      Dist LAD               50%            Tubular      Dist LAD               25%            Tubular      D1                     <25%           Tubular   Right Coronary Artery     Not evaluated   Left Main                 normal   * Denotes significant lesion    Tree Comment:  75% OM1 was directly stented with a 2.5x35m Cypher DES at     8 atm and post-dilated with a 2.25x852mQuantum Maverick to 16 atm.     Excellent result.   INTERVENTIONAL CATHETERIZATION   Lesion #1:  OM1 75%  (pre)  -> <25%  (interval)  ->  Normal (post)    EKG:  EKG is not ordered today.    Recent Labs: No results found for requested labs within last 365 days.  Recent Lipid Panel    Component Value Date/Time   CHOL 113 12/30/2019 0341   TRIG 129 12/30/2019 0341   HDL 30 (L) 12/30/2019 0341   CHOLHDL 3.8 12/30/2019 0341   VLDL 26 12/30/2019 0341   LDLCALC 57 12/30/2019 0341    PHYSICAL EXAM:    VS:  BP (!) 168/70 (BP Location: Left Arm, Patient Position: Sitting, Cuff Size: Normal)   Pulse 81   Ht '6\' 2"'  (1.88 m)   Wt 232 lb (105.2 kg)   SpO2 98%   BMI 29.79 kg/m   BMI: Body mass index is 29.79 kg/m.  Physical Exam Vitals reviewed.  Constitutional:      Appearance: He is well-developed.  HENT:     Head: Normocephalic and atraumatic.  Eyes:     General:        Right eye: No discharge.        Left eye: No discharge.  Neck:     Vascular: No JVD.  Cardiovascular:     Rate and Rhythm: Normal rate  and regular rhythm.     Heart sounds: Normal heart sounds, S1 normal and S2 normal. Heart sounds not distant. No midsystolic click and no opening snap. No murmur heard.    No friction rub.  Pulmonary:     Effort: Pulmonary effort is normal. No respiratory distress.     Breath sounds: Normal breath sounds. No decreased breath sounds, wheezing or rales.  Chest:     Chest wall: No tenderness.  Abdominal:     General: There is no distension.  Musculoskeletal:     Cervical back: Normal range of motion.     Right lower leg: Edema present.     Left lower leg: Edema present.  Skin:    General: Skin is warm and dry.     Nails: There is no clubbing.  Neurological:     Mental Status: He is alert and oriented to person, place, and time.  Psychiatric:        Speech: Speech normal.        Behavior: Behavior normal.        Thought Content: Thought content normal.        Judgment: Judgment normal.     Wt Readings from Last 3 Encounters:  07/20/22 232 lb (105.2 kg)  05/12/22 236 lb 6.4 oz (107.2 kg)  04/14/22  231 lb (104.8 kg)     ASSESSMENT & PLAN:   CAD involving native coronary arteries without angina: He continues to do well without symptoms concerning for angina or decompensation.  Continue risk factor modification and current medical therapy including aspirin, clopidogrel, carvedilol, and atorvastatin.  No indication for further ischemic testing at this time.  HTN: Blood pressure elevated at 168/70 at triage with repeat BP 146/86.  Discontinue amlodipine.  Titrate carvedilol to 25 mg twice daily.  He remains on HCTZ.  HLD: LDL 125 with goal being less than 70.  He remains on atorvastatin.  Update lipid panel at next visit.  Not fasting currently.  CKD stage III: Followed by nephrology.  Anemia: Possibly of chronic disease.  No symptoms concerning for bleeding.  Followed by PCP.  Chronic venous insufficiency/lymphedema: Followed by vascular surgery.  Taper off amlodipine as outlined above.    Disposition: F/u with Dr. Garen Lah or an APP in 2 months.   Medication Adjustments/Labs and Tests Ordered: Current medicines are reviewed at length with the patient today.  Concerns regarding medicines are outlined above. Medication changes, Labs and Tests ordered today are summarized above and listed in the Patient Instructions accessible in Encounters.   Signed, Christell Faith, PA-C 07/20/2022 12:15 PM     [address] [place], [place] [address] [phone]

## 2022-07-20 ENCOUNTER — Ambulatory Visit (INDEPENDENT_AMBULATORY_CARE_PROVIDER_SITE_OTHER): Payer: 59 | Admitting: Physician Assistant

## 2022-07-20 ENCOUNTER — Encounter: Payer: Self-pay | Admitting: Physician Assistant

## 2022-07-20 VITALS — BP 168/70 | HR 81 | Ht 74.0 in | Wt 232.0 lb

## 2022-07-20 DIAGNOSIS — N183 Chronic kidney disease, stage 3 unspecified: Secondary | ICD-10-CM | POA: Diagnosis not present

## 2022-07-20 DIAGNOSIS — E785 Hyperlipidemia, unspecified: Secondary | ICD-10-CM

## 2022-07-20 DIAGNOSIS — I10 Essential (primary) hypertension: Secondary | ICD-10-CM | POA: Diagnosis not present

## 2022-07-20 DIAGNOSIS — D649 Anemia, unspecified: Secondary | ICD-10-CM

## 2022-07-20 DIAGNOSIS — I2510 Atherosclerotic heart disease of native coronary artery without angina pectoris: Secondary | ICD-10-CM | POA: Diagnosis not present

## 2022-07-20 DIAGNOSIS — I872 Venous insufficiency (chronic) (peripheral): Secondary | ICD-10-CM

## 2022-07-20 MED ORDER — CARVEDILOL 25 MG PO TABS: 25.0000 mg | ORAL_TABLET | Freq: Two times a day (BID) | ORAL | 3 refills | Status: DC

## 2022-07-20 NOTE — Patient Instructions (Signed)
Medication Instructions:   Your physician has recommended you make the following change in your medication:   STOP Amlodipine. START Carvedilol 25 mg by mouth twice daily.  *If you need a refill on your cardiac medications before your next appointment, please call your pharmacy*   Lab Work: NONE If you have labs (blood work) drawn today and your tests are completely normal, you will receive your results only by: [place] (if you have MyChart) OR A paper copy in the mail If you have any lab test that is abnormal or we need to change your treatment, we will call you to review the results.   Testing/Procedures: NONE   Follow-Up: At [facility], you and your health needs are our priority.  As part of our continuing mission to provide you with exceptional heart care, we have created designated Provider Care Teams.  These Care Teams include your primary Cardiologist (physician) and Advanced Practice Providers (APPs -  Physician Assistants and Nurse Practitioners) who all work together to provide you with the care you need, when you need it.  We recommend signing up for the patient portal called "MyChart".  Sign up information is provided on this After Visit Summary.  MyChart is used to connect with patients for Virtual Visits (Telemedicine).  Patients are able to view lab/test results, encounter notes, upcoming appointments, etc.  Non-urgent messages can be sent to your provider as well.   To learn more about what you can do with MyChart, go to [email or web address].    Your next appointment:   2 month(s)  The format for your next appointment:   In Person  Provider:   You may see Kate Sable, MD or one of the following Advanced Practice Providers on your designated Care Team:   Murray Hodgkins, NP Christell Faith, PA-C Cadence Kathlen Mody, PA-C{    Important Information About Sugar

## 2022-07-29 ENCOUNTER — Encounter: Payer: Self-pay | Admitting: Podiatry

## 2022-07-29 ENCOUNTER — Ambulatory Visit (INDEPENDENT_AMBULATORY_CARE_PROVIDER_SITE_OTHER): Payer: 59 | Admitting: Podiatry

## 2022-07-29 DIAGNOSIS — M79675 Pain in left toe(s): Secondary | ICD-10-CM

## 2022-07-29 DIAGNOSIS — M79674 Pain in right toe(s): Secondary | ICD-10-CM | POA: Diagnosis not present

## 2022-07-29 DIAGNOSIS — N183 Chronic kidney disease, stage 3 unspecified: Secondary | ICD-10-CM

## 2022-07-29 DIAGNOSIS — B351 Tinea unguium: Secondary | ICD-10-CM

## 2022-07-29 DIAGNOSIS — E1122 Type 2 diabetes mellitus with diabetic chronic kidney disease: Secondary | ICD-10-CM

## 2022-07-29 DIAGNOSIS — I739 Peripheral vascular disease, unspecified: Secondary | ICD-10-CM

## 2022-07-29 DIAGNOSIS — D689 Coagulation defect, unspecified: Secondary | ICD-10-CM

## 2022-07-29 DIAGNOSIS — E119 Type 2 diabetes mellitus without complications: Secondary | ICD-10-CM

## 2022-07-29 NOTE — Progress Notes (Signed)
This patient returns to my office for at risk foot care.  This patient requires this care by a professional since this patient will be at risk due to having diagnosis of pvd, coagulation defect  and CKD stage 3. He is taking plavix.     This patient is unable to cut nails himself since the patient cannot reach his nails.These nails are painful walking and wearing shoes.  This patient presents for at risk foot care today.  General Appearance  Alert, conversant and in no acute stress.  Vascular  Dorsalis pedis and posterior tibial pulses are not palpable  B/L.  Swollen feet  B/L.  Absent digital hair.  Neurologic  Diminished  LOPS  B/L.  Nails Thick disfigured discolored nails with subungual debris  from hallux to fifth toes bilaterally. No evidence of bacterial infection or drainage bilaterally.  Orthopedic  No limitations of motion  feet .  No crepitus or effusions noted.  No bony pathology or digital deformities noted.  Skin  normotropic skin with no porokeratosis noted bilaterally.  No signs of infections or ulcers noted.     Onychomycosis  Pain in right toes  Pain in left toes  Consent was obtained for treatment procedures.   Mechanical debridement of nails 1-5  bilaterally performed with a nail nipper.  Filed with dremel without incident.    Return office visit  3 months                    Told patient to return for periodic foot care and evaluation due to potential at risk complications.   Gardiner Barefoot DPM

## 2022-09-07 NOTE — Progress Notes (Deleted)
MRN : [id number]  Grant Mcdaniel is a 81 y.o. ([birth year]) male who presents with chief complaint of legs swell.  History of Present Illness:   The patient returns to the office for followup evaluation regarding leg swelling.  The left leg is more affected than the right.  The swelling has persisted and the pain associated with swelling continues. There have not been any interval development of a ulcerations or wounds.   Since the previous visit the patient has been wearing graduated compression stockings and has noted little if any improvement in the lymphedema. The patient has been using compression routinely morning until night.   The patient also states elevation during the day and exercise is being done too.   No outpatient medications have been marked as taking for the 09/09/22 encounter (Appointment) with Delana Meyer, Dolores Lory, MD.    Past Medical History:  Diagnosis Date   CAD (coronary artery disease)    Chronic venous insufficiency 12/06/2020   CKD (chronic kidney disease) stage 3, GFR 30-59 ml/min (HCC)    CKD stage 3 due to type 2 diabetes mellitus ([place])    Closed hip fracture requiring operative repair, right, sequela 12/27/2019   Coagulation disorder ([place]) 06/23/6432   Complication of Foley catheter ([place])    Diabetes ([place]) 05/26/2020   Diabetes mellitus without complication ([place])    Enlarged prostate 05/26/2020   Essential hypertension    Hypercholesterolemia    Hyperlipidemia    Hypothyroidism    Leukocytosis    Lymphedema 03/11/2022   Monoclonal gammopathy 12/19/2019   Pain due to onychomycosis of toenails of both feet 05/26/2020   Preop examination    PVD (peripheral vascular disease) [facility])     Past Surgical History:  Procedure Laterality Date   galbladder     HIP ARTHROPLASTY Right 12/28/2019   Procedure: ARTHROPLASTY BIPOLAR HIP (HEMIARTHROPLASTY);  Surgeon: Thornton Park, MD;  Location: [facility] ORS;  Service: Orthopedics;  Laterality: Right;    Social  History Social History   Tobacco Use   Smoking status: Former    Types: Cigarettes   Smokeless tobacco: Current    Types: Nurse, children's Use: Never used  Substance Use Topics   Alcohol use: Never   Drug use: Never    Family History Family History  Problem Relation Age of Onset   Cancer Mother    CAD Father    Hypertension Brother     Allergies  Allergen Reactions   Sulfa Antibiotics Nausea And Vomiting     REVIEW OF SYSTEMS (Negative unless checked)  Constitutional: '[]'$ Weight loss  '[]'$ Fever  '[]'$ Chills Cardiac: '[]'$ Chest pain   '[]'$ Chest pressure   '[]'$ Palpitations   '[]'$ Shortness of breath when laying flat   '[]'$ Shortness of breath with exertion. Vascular:  '[]'$ Pain in legs with walking   '[x]'$ Pain in legs with standing  '[]'$ History of DVT   '[]'$ Phlebitis   '[x]'$ Swelling in legs   '[]'$ Varicose veins   '[]'$ Non-healing ulcers Pulmonary:   '[]'$ Uses home oxygen   '[]'$ Productive cough   '[]'$ Hemoptysis   '[]'$ Wheeze  '[]'$ COPD   '[]'$ Asthma Neurologic:  '[]'$ Dizziness   '[]'$ Seizures   '[]'$ History of stroke   '[]'$ History of TIA  '[]'$ Aphasia   '[]'$ Vissual changes   '[]'$ Weakness or numbness in arm   '[]'$ Weakness or numbness in leg Musculoskeletal:   '[]'$ Joint swelling   '[]'$ Joint pain   '[]'$ Low back pain Hematologic:  '[]'$ Easy bruising  '[]'$ Easy bleeding   '[]'$ Hypercoagulable state   '[]'$ Anemic Gastrointestinal:  '[]'$ Diarrhea   '[]'$   Vomiting  '[]'$ Gastroesophageal reflux/heartburn   '[]'$ Difficulty swallowing. Genitourinary:  '[]'$ Chronic kidney disease   '[]'$ Difficult urination  '[]'$ Frequent urination   '[]'$ Blood in urine Skin:  '[]'$ Rashes   '[]'$ Ulcers  Psychological:  '[]'$ History of anxiety   '[]'$  History of major depression.  Physical Examination  There were no vitals filed for this visit. There is no height or weight on file to calculate BMI. Gen: WD/WN, NAD Head: [place]/AT, No temporalis wasting.  Ear/Nose/Throat: Hearing grossly intact, nares w/o erythema or drainage, pinna without lesions Eyes: PER, EOMI, sclera nonicteric.  Neck: Supple, no gross masses.  No  JVD.  Pulmonary:  Good air movement, no audible wheezing, no use of accessory muscles.  Cardiac: RRR, precordium not hyperdynamic. Vascular:  scattered varicosities present bilaterally.  Mild venous stasis changes to the legs bilaterally.  3-4+ soft pitting edema  Vessel Right Left  Radial Palpable Palpable  Gastrointestinal: soft, non-distended. No guarding/no peritoneal signs.  Musculoskeletal: M/S 5/5 throughout.  No deformity.  Neurologic: CN 2-12 intact. Pain and light touch intact in extremities.  Symmetrical.  Speech is fluent. Motor exam as listed above. Psychiatric: Judgment intact, Mood & affect appropriate for pt's clinical situation. Dermatologic: Venous rashes no ulcers noted.  No changes consistent with cellulitis. Lymph : No lichenification or skin changes of chronic lymphedema.  CBC Lab Results  Component Value Date   WBC 11.2 (H) 01/06/2020   HGB 8.6 (L) 01/06/2020   HCT 27.1 (L) 01/06/2020   MCV 89.1 01/06/2020   PLT 205 01/06/2020    BMET    Component Value Date/Time   NA 137 02/27/2021 1306   K 4.6 02/27/2021 1306   CL 101 02/27/2021 1306   CO2 26 02/27/2021 1306   GLUCOSE 129 (H) 02/27/2021 1306   BUN 36 (H) 02/27/2021 1306   CREATININE 1.70 (H) 02/27/2021 1306   CALCIUM 10.1 02/27/2021 1306   GFRNONAA 41 (L) 02/27/2021 1306   GFRAA >60 01/06/2020 0521   CrCl cannot be calculated (Patient's most recent lab result is older than the maximum 21 days allowed.).  COAG Lab Results  Component Value Date   INR 1.2 01/02/2020    Radiology No results found.   Assessment/Plan There are no diagnoses linked to this encounter.   Hortencia Pilar, MD  09/07/2022 5:11 PM

## 2022-09-09 ENCOUNTER — Ambulatory Visit (INDEPENDENT_AMBULATORY_CARE_PROVIDER_SITE_OTHER): Payer: 59 | Admitting: Vascular Surgery

## 2022-09-09 DIAGNOSIS — I890 Lymphedema, not elsewhere classified: Secondary | ICD-10-CM

## 2022-09-09 DIAGNOSIS — I10 Essential (primary) hypertension: Secondary | ICD-10-CM

## 2022-09-09 DIAGNOSIS — I872 Venous insufficiency (chronic) (peripheral): Secondary | ICD-10-CM

## 2022-09-09 DIAGNOSIS — E119 Type 2 diabetes mellitus without complications: Secondary | ICD-10-CM

## 2022-09-09 DIAGNOSIS — I2510 Atherosclerotic heart disease of native coronary artery without angina pectoris: Secondary | ICD-10-CM

## 2022-09-10 NOTE — Progress Notes (Signed)
Requested PCP notes.  

## 2022-09-19 NOTE — Progress Notes (Deleted)
MRN : [id number]  Grant Mcdaniel is a 81 y.o. (12-Apr-1941) male who presents with chief complaint of legs hurt and swell.  History of Present Illness:   The patient returns to the office for followup evaluation regarding leg swelling.  The swelling has persisted but with the lymph pump is under much, much better controlled. The pain associated with swelling is decreased. There have not been any interval development of a ulcerations or wounds.  The patient denies problems with the pump, noting it is working well and the leggings are in good condition.  Since the previous visit the patient has been wearing graduated compression stockings and using the lymph pump on a routine basis and  has noted significant improvement in the lymphedema.   Patient stated the lymph pump has been helpful with the treatment of the lymphedema.    No outpatient medications have been marked as taking for the 09/20/22 encounter (Appointment) with Delana Meyer, Dolores Lory, MD.    Past Medical History:  Diagnosis Date   CAD (coronary artery disease)    Chronic venous insufficiency 12/06/2020   CKD (chronic kidney disease) stage 3, GFR 30-59 ml/min (HCC)    CKD stage 3 due to type 2 diabetes mellitus ([place])    Closed hip fracture requiring operative repair, right, sequela 12/27/2019   Coagulation disorder ([place]) 03/22/5955   Complication of Foley catheter ([place])    Diabetes ([place]) 05/26/2020   Diabetes mellitus without complication ([place])    Enlarged prostate 05/26/2020   Essential hypertension    Hypercholesterolemia    Hyperlipidemia    Hypothyroidism    Leukocytosis    Lymphedema 03/11/2022   Monoclonal gammopathy 12/19/2019   Pain due to onychomycosis of toenails of both feet 05/26/2020   Preop examination    PVD (peripheral vascular disease) [facility])     Past Surgical History:  Procedure Laterality Date   galbladder     HIP ARTHROPLASTY Right 12/28/2019   Procedure: ARTHROPLASTY BIPOLAR HIP  (HEMIARTHROPLASTY);  Surgeon: Thornton Park, MD;  Location: [facility] ORS;  Service: Orthopedics;  Laterality: Right;    Social History Social History   Tobacco Use   Smoking status: Former    Types: Cigarettes   Smokeless tobacco: Current    Types: Nurse, children's Use: Never used  Substance Use Topics   Alcohol use: Never   Drug use: Never    Family History Family History  Problem Relation Age of Onset   Cancer Mother    CAD Father    Hypertension Brother     Allergies  Allergen Reactions   Sulfa Antibiotics Nausea And Vomiting     REVIEW OF SYSTEMS (Negative unless checked)  Constitutional: '[]'$ Weight loss  '[]'$ Fever  '[]'$ Chills Cardiac: '[]'$ Chest pain   '[]'$ Chest pressure   '[]'$ Palpitations   '[]'$ Shortness of breath when laying flat   '[]'$ Shortness of breath with exertion. Vascular:  '[]'$ Pain in legs with walking   '[x]'$ Pain in legs at rest  '[]'$ History of DVT   '[]'$ Phlebitis   '[x]'$ Swelling in legs   '[]'$ Varicose veins   '[]'$ Non-healing ulcers Pulmonary:   '[]'$ Uses home oxygen   '[]'$ Productive cough   '[]'$ Hemoptysis   '[]'$ Wheeze  '[]'$ COPD   '[]'$ Asthma Neurologic:  '[]'$ Dizziness   '[]'$ Seizures   '[]'$ History of stroke   '[]'$ History of TIA  '[]'$ Aphasia   '[]'$ Vissual changes   '[]'$ Weakness or numbness in arm   '[]'$ Weakness or numbness in leg Musculoskeletal:   '[]'$ Joint swelling   '[]'$   Joint pain   '[]'$ Low back pain Hematologic:  '[]'$ Easy bruising  '[]'$ Easy bleeding   '[]'$ Hypercoagulable state   '[]'$ Anemic Gastrointestinal:  '[]'$ Diarrhea   '[]'$ Vomiting  '[]'$ Gastroesophageal reflux/heartburn   '[]'$ Difficulty swallowing. Genitourinary:  '[]'$ Chronic kidney disease   '[]'$ Difficult urination  '[]'$ Frequent urination   '[]'$ Blood in urine Skin:  '[]'$ Rashes   '[]'$ Ulcers  Psychological:  '[]'$ History of anxiety   '[]'$  History of major depression.  Physical Examination  There were no vitals filed for this visit. There is no height or weight on file to calculate BMI. Gen: WD/WN, NAD Head: [place]/AT, No temporalis wasting.  Ear/Nose/Throat: Hearing grossly intact, nares  w/o erythema or drainage, pinna without lesions Eyes: PER, EOMI, sclera nonicteric.  Neck: Supple, no gross masses.  No JVD.  Pulmonary:  Good air movement, no audible wheezing, no use of accessory muscles.  Cardiac: RRR, precordium not hyperdynamic. Vascular:  scattered varicosities present bilaterally.  Moderate venous stasis changes to the legs bilaterally.  2+ soft pitting edema  Vessel Right Left  Radial Palpable Palpable  Gastrointestinal: soft, non-distended. No guarding/no peritoneal signs.  Musculoskeletal: M/S 5/5 throughout.  No deformity.  Neurologic: CN 2-12 intact. Pain and light touch intact in extremities.  Symmetrical.  Speech is fluent. Motor exam as listed above. Psychiatric: Judgment intact, Mood & affect appropriate for pt's clinical situation. Dermatologic: Venous rashes no ulcers noted.  No changes consistent with cellulitis. Lymph : No lichenification or skin changes of chronic lymphedema.  CBC Lab Results  Component Value Date   WBC 11.2 (H) 01/06/2020   HGB 8.6 (L) 01/06/2020   HCT 27.1 (L) 01/06/2020   MCV 89.1 01/06/2020   PLT 205 01/06/2020    BMET    Component Value Date/Time   NA 137 02/27/2021 1306   K 4.6 02/27/2021 1306   CL 101 02/27/2021 1306   CO2 26 02/27/2021 1306   GLUCOSE 129 (H) 02/27/2021 1306   BUN 36 (H) 02/27/2021 1306   CREATININE 1.70 (H) 02/27/2021 1306   CALCIUM 10.1 02/27/2021 1306   GFRNONAA 41 (L) 02/27/2021 1306   GFRAA >60 01/06/2020 0521   CrCl cannot be calculated (Patient's most recent lab result is older than the maximum 21 days allowed.).  COAG Lab Results  Component Value Date   INR 1.2 01/02/2020    Radiology No results found.   Assessment/Plan There are no diagnoses linked to this encounter.   Hortencia Pilar, MD  09/19/2022 4:18 PM

## 2022-09-20 ENCOUNTER — Ambulatory Visit (INDEPENDENT_AMBULATORY_CARE_PROVIDER_SITE_OTHER): Payer: 59 | Admitting: Vascular Surgery

## 2022-09-20 ENCOUNTER — Ambulatory Visit: Payer: 59 | Admitting: Physician Assistant

## 2022-09-20 DIAGNOSIS — I2510 Atherosclerotic heart disease of native coronary artery without angina pectoris: Secondary | ICD-10-CM

## 2022-09-20 DIAGNOSIS — I872 Venous insufficiency (chronic) (peripheral): Secondary | ICD-10-CM

## 2022-09-20 DIAGNOSIS — I10 Essential (primary) hypertension: Secondary | ICD-10-CM

## 2022-09-20 DIAGNOSIS — I890 Lymphedema, not elsewhere classified: Secondary | ICD-10-CM

## 2022-09-20 DIAGNOSIS — E785 Hyperlipidemia, unspecified: Secondary | ICD-10-CM

## 2022-09-20 DIAGNOSIS — E119 Type 2 diabetes mellitus without complications: Secondary | ICD-10-CM

## 2022-10-29 ENCOUNTER — Ambulatory Visit: Payer: 59 | Admitting: Cardiology

## 2022-11-16 ENCOUNTER — Other Ambulatory Visit: Payer: Self-pay

## 2022-11-16 MED ORDER — ATORVASTATIN CALCIUM 80 MG PO TABS: 80.0000 mg | ORAL_TABLET | Freq: Every day | ORAL | 1 refills | Status: DC

## 2022-11-18 ENCOUNTER — Ambulatory Visit: Payer: 59 | Admitting: Podiatry

## 2022-12-10 ENCOUNTER — Ambulatory Visit: Payer: 59 | Admitting: Cardiology

## 2023-01-20 ENCOUNTER — Ambulatory Visit: Payer: 59 | Admitting: Podiatry

## 2023-01-26 NOTE — Progress Notes (Unsigned)
Cardiology Office Note    Date:  01/27/2023   ID:  Grant, Mcdaniel 31-Dec-1940, MRN [id number]  PCP:  Marguerita Merles, MD  Cardiologist:  Kate Sable, MD  Electrophysiologist:  None   Chief Complaint: Follow-up  History of Present Illness:   Grant Mcdaniel is a 82 y.o. male with history of CAD status post PCI/DES to [place] in 2004 at [facility], CKD stage III, DM2, HTN, HLD, anemia, and chronic venous insufficiency/lymphedema who presents for follow-up of CAD and hypertension.   LHC in 06/2003 demonstrated 75% OM1 stenosis status post PCI/DES with otherwise nonobstructive disease.  He has not required ischemic evaluation since.  Echo from 12/2019 showed an EF of 55 to 60%, mildly increased LV septal wall thickness, borderline LVH, no regional wall motion abnormalities, normal RV systolic function and ventricular cavity size, and trivial mitral regurgitation.   He was seen in the office in 02/2022, at which time it was noted outside office had discontinued benazepril and bisoprolol.  His BP was elevated in the 680S systolic.  Amlodipine was titrated to 10 mg daily.  He was seen in the office on 04/14/2022 and was without symptoms of angina or decompensation.  Blood pressures were typically in the 811S to 315X systolic.  He was started on carvedilol 6.25 mg twice daily and continued on amlodipine along with HCTZ.  He was seen in the office in 04/2022 and was without symptoms of angina or decompensation.  Amlodipine was decreased to 5 mg daily with further plans to ultimately discontinue, and carvedilol was titrated to 12.5 mg twice daily.  He was last seen in the office in 07/2022 and remained without symptoms of angina or cardiac decompensation.  With decreasing amlodipine, he noted some improvement in lower extremity swelling.  Blood pressures at home were in the 458P to 929W systolic.  Amlodipine was discontinued with titration of carvedilol to 25 mg twice daily.  He comes in doing well from a  cardiac perspective and is without symptoms of angina or decompensation.  No dyspnea, palpitations, dizziness, presyncope, or syncope.  No falls or symptoms concerning for bleeding.  His lymphedema is stable.  Weight remains stable.  No abdominal distention or progressive orthopnea.  Previously, he was eating out at restaurants several days per week, though since the passing of his wife, he will only eat out at restaurants every couple of months.  He is not adding salt to food, though does not strictly monitor his sodium intake with his food choices.  Tolerating carvedilol and HCTZ.  Home BP readings continue to move and are now mostly in the 446K to 863O systolic with an occasional reading in the 177N systolic.  No significantly elevated BP readings.  He has not needed any as needed furosemide.  He has not followed up with nephrology since 10/2021.   Labs independently reviewed: 03/2022 - A1c 7.6, TSH normal 10/2021 - potassium 4.5, BUN 20, serum creatinine 1.6 04/2021 - albumin 4.8, AST/ALT normal, TC 206, TG 245, HDL 38, LDL 125 02/2021 - Hgb 12.6 01/2020 - Hgb 9.8, PLT 285  Past Medical History:  Diagnosis Date   CAD (coronary artery disease)    Chronic venous insufficiency 12/06/2020   CKD (chronic kidney disease) stage 3, GFR 30-59 ml/min (HCC)    CKD stage 3 due to type 2 diabetes mellitus ([place])    Closed hip fracture requiring operative repair, right, sequela 12/27/2019   Coagulation disorder ([place]) 12/25/5788   Complication of Foley catheter (  [place])    Diabetes ([place]) 05/26/2020   Diabetes mellitus without complication ([place])    Enlarged prostate 05/26/2020   Essential hypertension    Hypercholesterolemia    Hyperlipidemia    Hypothyroidism    Leukocytosis    Lymphedema 03/11/2022   Monoclonal gammopathy 12/19/2019   Pain due to onychomycosis of toenails of both feet 05/26/2020   Preop examination    PVD (peripheral vascular disease) [facility])     Past Surgical History:  Procedure Laterality Date    galbladder     HIP ARTHROPLASTY Right 12/28/2019   Procedure: ARTHROPLASTY BIPOLAR HIP (HEMIARTHROPLASTY);  Surgeon: Thornton Park, MD;  Location: [facility] ORS;  Service: Orthopedics;  Laterality: Right;    Current Medications: Current Meds  Medication Sig   amLODipine (NORVASC) 5 MG tablet Take 1 tablet (5 mg total) by mouth daily.   Ascorbic Acid (VITAMIN C) 100 MG tablet Take 100 mg by mouth daily. Unable to confirm dosage   aspirin EC 81 MG tablet Take 81 mg by mouth at bedtime.    atorvastatin (LIPITOR) 80 MG tablet Take 1 tablet (80 mg total) by mouth at bedtime.   carvedilol (COREG) 25 MG tablet Take 1 tablet (25 mg total) by mouth 2 (two) times daily.   clopidogrel (PLAVIX) 75 MG tablet Take 75 mg by mouth daily.   fluticasone (FLONASE) 50 MCG/ACT nasal spray Place 1 spray into both nostrils as needed for allergies or rhinitis.   furosemide (LASIX) 40 MG tablet Take 1 tablet (40 mg total) by mouth as needed.   hydrochlorothiazide (MICROZIDE) 12.5 MG capsule Take 12.5 mg by mouth daily.   levothyroxine (SYNTHROID) 75 MCG tablet Take 75 mcg by mouth daily before breakfast.    linagliptin (TRADJENTA) 5 MG TABS tablet Take 5 mg by mouth daily.    loratadine (CLARITIN) 10 MG tablet Take 5 mg by mouth daily.   Multiple Vitamin (MULTIVITAMIN WITH MINERALS) TABS tablet Take 1 tablet by mouth daily.   tamsulosin (FLOMAX) 0.4 MG CAPS capsule Take 0.4 mg by mouth daily.    vitamin B-12 (CYANOCOBALAMIN) 500 MCG tablet Take 500 mcg by mouth daily.    Allergies:   Sulfa antibiotics   Social History   Socioeconomic History   Marital status: Married    Spouse name: Not on file   Number of children: Not on file   Years of education: Not on file   Highest education level: Not on file  Occupational History   Not on file  Tobacco Use   Smoking status: Former    Types: Cigarettes   Smokeless tobacco: Current    Types: Chew   Tobacco comments:    occasionally  Vaping Use   Vaping Use:  Never used  Substance and Sexual Activity   Alcohol use: Never   Drug use: Never   Sexual activity: Not on file  Other Topics Concern   Not on file  Social History Narrative   Not on file   Social Determinants of Health   Financial Resource Strain: Not on file  Food Insecurity: Not on file  Transportation Needs: Not on file  Physical Activity: Not on file  Stress: Not on file  Social Connections: Not on file     Family History:  The patient's family history includes CAD in his father; Cancer in his mother; Hypertension in his brother.  ROS:   12-point review of systems is negative unless otherwise noted in the HPI.   EKGs/Labs/Other Studies Reviewed:    Studies  reviewed were summarized above. The additional studies were reviewed today:  2D echo 12/29/2019:  1. Left ventricular ejection fraction, by visual estimation, is 55 to  60%. The left ventricle has normal function. Left ventricular septal wall  thickness was mildly increased. There is borderline left ventricular  hypertrophy.   2. The left ventricle has no regional wall motion abnormalities.   3. Global right ventricle has normal systolic function.The right  ventricular size is normal. No increase in right ventricular wall  thickness.   4. Left atrial size was normal.   5. Right atrial size was normal.   6. The mitral valve is grossly normal. Trivial mitral valve  regurgitation.   7. The tricuspid valve is grossly normal.   8. The aortic valve is tricuspid. Aortic valve regurgitation is not  visualized.   9. The pulmonic valve was grossly normal. Pulmonic valve regurgitation is  not visualized.  10. The atrial septum is grossly normal. __________   LHC 06/2003: CORONARY ARTERIOGRAMS   Branch                    Stenosis       Lesion Type  TIMI Flow   ------------------------  -------------  -----------  ----------   Left Circumflex      *OM1                   75%            Diffuse   Left Anterior  Descending      Mid LAD                <25%           Tubular      Dist LAD               50%            Tubular      Dist LAD               25%            Tubular      D1                     <25%           Tubular   Right Coronary Artery     Not evaluated   Left Main                 normal   * Denotes significant lesion    Tree Comment:  75% OM1 was directly stented with a 2.5x39m Cypher DES at     8 atm and post-dilated with a 2.25x81mQuantum Maverick to 16 atm.     Excellent result.   INTERVENTIONAL CATHETERIZATION   Lesion #1:  OM1 75%  (pre)  -> <25%  (interval)  -> Normal (post)    EKG:  EKG is ordered today.  The EKG ordered today demonstrates NSR, 73 bpm, no acute ST-T changes  Recent Labs: No results found for requested labs within last 365 days.  Recent Lipid Panel    Component Value Date/Time   CHOL 113 12/30/2019 0341   TRIG 129 12/30/2019 0341   HDL 30 (L) 12/30/2019 0341   CHOLHDL 3.8 12/30/2019 0341   VLDL 26 12/30/2019 0341   LDLCALC 57 12/30/2019 0341    PHYSICAL EXAM:    VS:  BP 138/74   Pulse 73  Ht '6\' 2"'$  (1.88 m)   Wt 231 lb 12.8 oz (105.1 kg)   SpO2 92%   BMI 29.76 kg/m   BMI: Body mass index is 29.76 kg/m.  Physical Exam Vitals reviewed.  Constitutional:      Appearance: He is well-developed.  HENT:     Head: Normocephalic and atraumatic.  Eyes:     General:        Right eye: No discharge.        Left eye: No discharge.  Neck:     Vascular: No JVD.  Cardiovascular:     Rate and Rhythm: Normal rate and regular rhythm.     Heart sounds: Normal heart sounds, S1 normal and S2 normal. Heart sounds not distant. No midsystolic click and no opening snap. No murmur heard.    No friction rub.  Pulmonary:     Effort: Pulmonary effort is normal. No respiratory distress.     Breath sounds: Normal breath sounds. No decreased breath sounds, wheezing or rales.  Chest:     Chest wall: No tenderness.  Abdominal:     General: There is no  distension.  Musculoskeletal:     Cervical back: Normal range of motion.     Right lower leg: Edema present.     Left lower leg: Edema present.     Comments: Lymphedema with chronic venous hyperpigmentation noted along the bilateral lower extremities.  Skin:    General: Skin is warm and dry.     Nails: There is no clubbing.  Neurological:     Mental Status: He is alert and oriented to person, place, and time.  Psychiatric:        Speech: Speech normal.        Behavior: Behavior normal.        Thought Content: Thought content normal.        Judgment: Judgment normal.     Wt Readings from Last 3 Encounters:  01/27/23 231 lb 12.8 oz (105.1 kg)  07/20/22 232 lb (105.2 kg)  05/12/22 236 lb 6.4 oz (107.2 kg)     ASSESSMENT & PLAN:   CAD involving the native coronary arteries without angina: He continues to do very well from a cardiac perspective and is without symptoms of angina or decompensation.  Continue risk factor modification and secondary prevention including aspirin, clopidogrel, carvedilol, and atorvastatin.  No indication for further ischemic testing at this time.  HTN: Blood pressure is improved when compared to prior visits, though does remain elevated at triage with repeat blood pressure improved to 138/74.  Add amlodipine 5 mg daily, would not titrate this moving forward given his history of lymphedema.  He remains on carvedilol 25 mg twice daily and HCTZ 12.5 mg daily (last nephrology note indicates he is not on HCTZ).  Check renal function and electrolytes, with further recommendations to follow.  Low-sodium diet is encouraged.  HLD: LDL 125 with goal being less than 70.  He remains on atorvastatin.  Check lipid panel, CMP, and direct LDL.  CKD stage III: No longer on ACE inhibitor with primary cardiologist note indicating this was stopped by nephrology due to underlying CKD.  Last nephrology note indicates the patient remained on Lotensin.  Will defer this management to  nephrology.  Avoid nephrotoxic substances.  Anemia: Possibly of chronic disease in the setting of underlying renal dysfunction.  Check CBC.  Chronic venous insufficiency/lymphedema: Stable to improved.  Continue to encourage leg elevation and wraps.  He has not needed any  as needed furosemide.  Followed by vascular surgery.   Disposition: F/u with Dr. Garen Lah or an APP in 6 months.   Medication Adjustments/Labs and Tests Ordered: Current medicines are reviewed at length with the patient today.  Concerns regarding medicines are outlined above. Medication changes, Labs and Tests ordered today are summarized above and listed in the Patient Instructions accessible in Encounters.   Signed, Christell Faith, PA-C 01/27/2023 4:13 PM     [facility] [address] [place] [address] [place], [place] [address] [phone]

## 2023-01-27 ENCOUNTER — Ambulatory Visit: Payer: 59 | Attending: Physician Assistant | Admitting: Physician Assistant

## 2023-01-27 ENCOUNTER — Other Ambulatory Visit
Admission: RE | Admit: 2023-01-27 | Discharge: 2023-01-27 | Disposition: A | Discharge status: Home / Self Care | Payer: 59 | Source: Ambulatory Visit | Attending: Physician Assistant | Admitting: Physician Assistant

## 2023-01-27 ENCOUNTER — Encounter: Payer: Self-pay | Admitting: Physician Assistant

## 2023-01-27 VITALS — BP 138/74 | HR 73 | Ht 74.0 in | Wt 231.8 lb

## 2023-01-27 DIAGNOSIS — E785 Hyperlipidemia, unspecified: Secondary | ICD-10-CM | POA: Diagnosis not present

## 2023-01-27 DIAGNOSIS — I2510 Atherosclerotic heart disease of native coronary artery without angina pectoris: Secondary | ICD-10-CM | POA: Diagnosis not present

## 2023-01-27 DIAGNOSIS — I872 Venous insufficiency (chronic) (peripheral): Secondary | ICD-10-CM

## 2023-01-27 DIAGNOSIS — I10 Essential (primary) hypertension: Secondary | ICD-10-CM | POA: Diagnosis not present

## 2023-01-27 DIAGNOSIS — D649 Anemia, unspecified: Secondary | ICD-10-CM

## 2023-01-27 DIAGNOSIS — R600 Localized edema: Secondary | ICD-10-CM

## 2023-01-27 DIAGNOSIS — N183 Chronic kidney disease, stage 3 unspecified: Secondary | ICD-10-CM | POA: Diagnosis not present

## 2023-01-27 LAB — CBC WITH DIFFERENTIAL/PLATELET
Abs Immature Granulocytes: 0.01 10*3/uL (ref 0.00–0.07)
Basophils Absolute: 0 10*3/uL (ref 0.0–0.1)
Basophils Relative: 0 %
Eosinophils Absolute: 0.1 10*3/uL (ref 0.0–0.5)
Eosinophils Relative: 1 %
HCT: 36.8 % — ABNORMAL LOW (ref 39.0–52.0)
Hemoglobin: 11.9 g/dL — ABNORMAL LOW (ref 13.0–17.0)
Immature Granulocytes: 0 %
Lymphocytes Relative: 37 %
Lymphs Abs: 2.2 10*3/uL (ref 0.7–4.0)
MCH: 29.8 pg (ref 26.0–34.0)
MCHC: 32.3 g/dL (ref 30.0–36.0)
MCV: 92 fL (ref 80.0–100.0)
Monocytes Absolute: 0.6 10*3/uL (ref 0.1–1.0)
Monocytes Relative: 10 %
Neutro Abs: 3.1 10*3/uL (ref 1.7–7.7)
Neutrophils Relative %: 52 %
Platelets: 126 10*3/uL — ABNORMAL LOW (ref 150–400)
RBC: 4 MIL/uL — ABNORMAL LOW (ref 4.22–5.81)
RDW: 14.6 % (ref 11.5–15.5)
WBC: 6 10*3/uL (ref 4.0–10.5)
nRBC: 0 % (ref 0.0–0.2)

## 2023-01-27 LAB — COMPREHENSIVE METABOLIC PANEL
ALT: 14 U/L (ref 0–44)
AST: 19 U/L (ref 15–41)
Albumin: 4.2 g/dL (ref 3.5–5.0)
Alkaline Phosphatase: 84 U/L (ref 38–126)
Anion gap: 10 (ref 5–15)
BUN: 29 mg/dL — ABNORMAL HIGH (ref 8–23)
CO2: 30 mmol/L (ref 22–32)
Calcium: 9.7 mg/dL (ref 8.9–10.3)
Chloride: 94 mmol/L — ABNORMAL LOW (ref 98–111)
Creatinine, Ser: 1.44 mg/dL — ABNORMAL HIGH (ref 0.61–1.24)
GFR, Estimated: 49 mL/min — ABNORMAL LOW (ref >60)
Glucose, Bld: 118 mg/dL — ABNORMAL HIGH (ref 70–99)
Potassium: 4.5 mmol/L (ref 3.5–5.1)
Sodium: 134 mmol/L — ABNORMAL LOW (ref 135–145)
Total Bilirubin: 1.5 mg/dL — ABNORMAL HIGH (ref 0.3–1.2)
Total Protein: 7.5 g/dL (ref 6.5–8.1)

## 2023-01-27 LAB — LIPID PANEL
Cholesterol: 101 mg/dL (ref 0–200)
HDL: 29 mg/dL — ABNORMAL LOW (ref >40)
LDL Cholesterol: 6 mg/dL (ref 0–99)
Total CHOL/HDL Ratio: 3.5 RATIO
Triglycerides: 329 mg/dL — ABNORMAL HIGH (ref <150)
VLDL: 66 mg/dL — ABNORMAL HIGH (ref 0–40)

## 2023-01-27 LAB — LDL CHOLESTEROL, DIRECT: Direct LDL: 42 mg/dL (ref 0–99)

## 2023-01-27 MED ORDER — AMLODIPINE BESYLATE 5 MG PO TABS: 5.0000 mg | ORAL_TABLET | Freq: Every day | ORAL | 3 refills | Status: DC

## 2023-01-27 NOTE — Patient Instructions (Signed)
Medication Instructions:   START Amlodipine - Take one tablet (5 mg) by mouth daily.   *If you need a refill on your cardiac medications before your next appointment, please call your pharmacy*   Lab Work:  Your physician recommends you go to the medical mall for labs.   If you have labs (blood work) drawn today and your tests are completely normal, you will receive your results only by: [place] (if you have MyChart) OR A paper copy in the mail If you have any lab test that is abnormal or we need to change your treatment, we will call you to review the results.   Testing/Procedures:  None Ordered   Follow-Up: At [facility], you and your health needs are our priority.  As part of our continuing mission to provide you with exceptional heart care, we have created designated Provider Care Teams.  These Care Teams include your primary Cardiologist (physician) and Advanced Practice Providers (APPs -  Physician Assistants and Nurse Practitioners) who all work together to provide you with the care you need, when you need it.  We recommend signing up for the patient portal called "MyChart".  Sign up information is provided on this After Visit Summary.  MyChart is used to connect with patients for Virtual Visits (Telemedicine).  Patients are able to view lab/test results, encounter notes, upcoming appointments, etc.  Non-urgent messages can be sent to your provider as well.   To learn more about what you can do with MyChart, go to [email or web address].    Your next appointment:   6 month(s)  Provider:   You may see Kate Sable, MD or one of the following Advanced Practice Providers on your designated Care Team:   Murray Hodgkins, NP Christell Faith, PA-C Cadence Kathlen Mody, PA-C Gerrie Nordmann, NP

## 2023-01-28 ENCOUNTER — Encounter: Payer: Self-pay | Admitting: *Deleted

## 2023-04-14 ENCOUNTER — Ambulatory Visit: Payer: 59 | Admitting: Podiatry

## 2023-04-25 ENCOUNTER — Ambulatory Visit (INDEPENDENT_AMBULATORY_CARE_PROVIDER_SITE_OTHER): Payer: 59 | Admitting: Nurse Practitioner

## 2023-04-25 ENCOUNTER — Encounter (INDEPENDENT_AMBULATORY_CARE_PROVIDER_SITE_OTHER): Payer: Self-pay

## 2023-04-25 VITALS — BP 137/76 | HR 74 | Resp 16

## 2023-04-25 DIAGNOSIS — I872 Venous insufficiency (chronic) (peripheral): Secondary | ICD-10-CM | POA: Diagnosis not present

## 2023-04-25 NOTE — Progress Notes (Signed)
History of Present Illness  There is no documented history at this time  Assessments & Plan   There are no diagnoses linked to this encounter.    Additional instructions  Subjective:  Patient presents with venous ulcer of the Bilateral lower extremity.    Procedure:  3 layer unna wrap was placed Bilateral lower extremity.   Plan:   Follow up in one week.  

## 2023-05-02 ENCOUNTER — Encounter (INDEPENDENT_AMBULATORY_CARE_PROVIDER_SITE_OTHER): Payer: 59

## 2023-05-09 ENCOUNTER — Ambulatory Visit: Payer: 59 | Admitting: Podiatry

## 2023-05-09 ENCOUNTER — Ambulatory Visit (INDEPENDENT_AMBULATORY_CARE_PROVIDER_SITE_OTHER): Payer: 59 | Admitting: Nurse Practitioner

## 2023-05-09 ENCOUNTER — Encounter (INDEPENDENT_AMBULATORY_CARE_PROVIDER_SITE_OTHER): Payer: Self-pay | Admitting: Nurse Practitioner

## 2023-05-09 VITALS — BP 137/72 | HR 65 | Resp 16

## 2023-05-09 DIAGNOSIS — I890 Lymphedema, not elsewhere classified: Secondary | ICD-10-CM

## 2023-05-09 NOTE — Progress Notes (Signed)
History of Present Illness  There is no documented history at this time  Assessments & Plan   There are no diagnoses linked to this encounter.    Additional instructions  Subjective:  Patient presents with venous ulcer of the Bilateral lower extremity.    Procedure:  3 layer unna wrap was placed Bilateral lower extremity.   Plan:   Follow up in one week.  

## 2023-05-17 ENCOUNTER — Encounter (INDEPENDENT_AMBULATORY_CARE_PROVIDER_SITE_OTHER): Payer: Self-pay

## 2023-05-17 ENCOUNTER — Ambulatory Visit (INDEPENDENT_AMBULATORY_CARE_PROVIDER_SITE_OTHER): Payer: 59 | Admitting: Nurse Practitioner

## 2023-05-17 VITALS — BP 113/58 | HR 71 | Resp 18

## 2023-05-17 DIAGNOSIS — I890 Lymphedema, not elsewhere classified: Secondary | ICD-10-CM

## 2023-05-17 NOTE — Progress Notes (Signed)
History of Present Illness  There is no documented history at this time  Assessments & Plan   There are no diagnoses linked to this encounter.    Additional instructions  Subjective:  Patient presents with venous ulcer of the Bilateral lower extremity.    Procedure:  3 layer unna wrap was placed Bilateral lower extremity.   Plan:   Follow up in one week.  

## 2023-05-24 ENCOUNTER — Encounter (INDEPENDENT_AMBULATORY_CARE_PROVIDER_SITE_OTHER): Payer: 59

## 2023-06-02 ENCOUNTER — Ambulatory Visit (INDEPENDENT_AMBULATORY_CARE_PROVIDER_SITE_OTHER): Payer: 59 | Admitting: Podiatry

## 2023-06-02 ENCOUNTER — Encounter: Payer: Self-pay | Admitting: Podiatry

## 2023-06-02 VITALS — BP 156/66 | HR 77

## 2023-06-02 DIAGNOSIS — B351 Tinea unguium: Secondary | ICD-10-CM | POA: Diagnosis not present

## 2023-06-02 DIAGNOSIS — I890 Lymphedema, not elsewhere classified: Secondary | ICD-10-CM

## 2023-06-02 DIAGNOSIS — M79674 Pain in right toe(s): Secondary | ICD-10-CM

## 2023-06-02 DIAGNOSIS — E1151 Type 2 diabetes mellitus with diabetic peripheral angiopathy without gangrene: Secondary | ICD-10-CM

## 2023-06-02 DIAGNOSIS — E119 Type 2 diabetes mellitus without complications: Secondary | ICD-10-CM

## 2023-06-02 DIAGNOSIS — M79675 Pain in left toe(s): Secondary | ICD-10-CM | POA: Diagnosis not present

## 2023-06-02 NOTE — Progress Notes (Signed)
ANNUAL DIABETIC FOOT EXAM  Subjective: Grant Mcdaniel presents today annual diabetic foot exam.  Chief Complaint  Patient presents with   Nail Problem    "Clip my toenails."  Lorelle Formosa NP [facility]) - 6 mos ago, Glucose - haven't checked it   Patient confirms h/o diabetes.  Patient denies any h/o foot wounds.  Patient denies any numbness, tingling, burning, or pins/needle sensation in feet.  Risk factors: diabetes, PAD, chronic lower extremity edema, HTN, CAD, CKD, hyperlipidemia, hypercholesterolemia, h/o tobacco use in remission.  Leanna Sato, MD is patient's PCP.  Past Medical History:  Diagnosis Date   CAD (coronary artery disease)    Chronic venous insufficiency 12/06/2020   CKD (chronic kidney disease) stage 3, GFR 30-59 ml/min (HCC)    CKD stage 3 due to type 2 diabetes mellitus (HCC)    Closed hip fracture requiring operative repair, right, sequela 12/27/2019   Coagulation disorder (HCC) 05/26/2020   Complication of Foley catheter (HCC)    Diabetes (HCC) 05/26/2020   Diabetes mellitus without complication (HCC)    Enlarged prostate 05/26/2020   Essential hypertension    Hypercholesterolemia    Hyperlipidemia    Hypothyroidism    Leukocytosis    Lymphedema 03/11/2022   Monoclonal gammopathy 12/19/2019   Pain due to onychomycosis of toenails of both feet 05/26/2020   Preop examination    PVD (peripheral vascular disease) (HCC)    Patient Active Problem List   Diagnosis Date Noted   CAD (coronary artery disease) 05/11/2022   CKD (chronic kidney disease) stage 3, GFR 30-59 ml/min (HCC) 05/11/2022   Diabetes mellitus without complication (HCC) 05/11/2022   Hypercholesterolemia 05/11/2022   Lymphedema 03/11/2022   Chronic venous insufficiency 12/06/2020   Diabetes (HCC) 05/26/2020   Enlarged prostate 05/26/2020   Pain due to onychomycosis of toenails of both feet 05/26/2020   Coagulation disorder (HCC) 05/26/2020   Complication of Foley catheter (HCC)     Leukocytosis    Closed hip fracture requiring operative repair, right, sequela 12/27/2019   Preop examination    CKD stage 3 due to type 2 diabetes mellitus (HCC)    Essential hypertension    Hyperlipidemia    Hypothyroidism    PVD (peripheral vascular disease) (HCC)    Monoclonal gammopathy 12/19/2019   Past Surgical History:  Procedure Laterality Date   galbladder     HIP ARTHROPLASTY Right 12/28/2019   Procedure: ARTHROPLASTY BIPOLAR HIP (HEMIARTHROPLASTY);  Surgeon: Juanell Fairly, MD;  Location: [facility] ORS;  Service: Orthopedics;  Laterality: Right;   Current Outpatient Medications on File Prior to Visit  Medication Sig Dispense Refill   amLODipine (NORVASC) 5 MG tablet Take 1 tablet (5 mg total) by mouth daily. 90 tablet 3   Ascorbic Acid (VITAMIN C) 100 MG tablet Take 100 mg by mouth daily. Unable to confirm dosage     aspirin EC 81 MG tablet Take 81 mg by mouth at bedtime.      atorvastatin (LIPITOR) 80 MG tablet Take 1 tablet (80 mg total) by mouth at bedtime. 90 tablet 1   carvedilol (COREG) 25 MG tablet Take 1 tablet (25 mg total) by mouth 2 (two) times daily. 180 tablet 3   clopidogrel (PLAVIX) 75 MG tablet Take 75 mg by mouth daily.     Coenzyme Q10 10 MG capsule Take 10 mg by mouth daily.     fluticasone (FLONASE) 50 MCG/ACT nasal spray Place 1 spray into both nostrils as needed for allergies or rhinitis.  furosemide (LASIX) 40 MG tablet Take 1 tablet (40 mg total) by mouth as needed. 30 tablet 5   hydrochlorothiazide (MICROZIDE) 12.5 MG capsule Take 12.5 mg by mouth daily.     levothyroxine (SYNTHROID) 75 MCG tablet Take 75 mcg by mouth daily before breakfast.      linagliptin (TRADJENTA) 5 MG TABS tablet Take 5 mg by mouth daily.      loratadine (CLARITIN) 10 MG tablet Take 5 mg by mouth daily.     Multiple Vitamin (MULTIVITAMIN WITH MINERALS) TABS tablet Take 1 tablet by mouth daily.     tamsulosin (FLOMAX) 0.4 MG CAPS capsule Take 0.4 mg by mouth daily.       vitamin B-12 (CYANOCOBALAMIN) 500 MCG tablet Take 500 mcg by mouth daily.     No current facility-administered medications on file prior to visit.    Allergies  Allergen Reactions   Sulfa Antibiotics Nausea And Vomiting   Social History   Occupational History   Not on file  Tobacco Use   Smoking status: Former    Types: Cigarettes   Smokeless tobacco: Former    Types: Chew   Tobacco comments:    occasionally  Vaping Use   Vaping Use: Never used  Substance and Sexual Activity   Alcohol use: Never   Drug use: Never   Sexual activity: Not on file   Family History  Problem Relation Age of Onset   Cancer Mother    CAD Father    Hypertension Brother     There is no immunization history on file for this patient.   Review of Systems: Negative except as noted in the HPI.   Objective: Vitals:   06/02/23 1456  BP: (!) 156/66  Pulse: 77   Grant Mcdaniel is a pleasant 82 y.o. male in NAD. AAO X 3.  Lab Results  Component Value Date   HGBA1C 7.3 (H) 12/27/2019  Vascular Examination: CFT <4 seconds b/l. DP pulses diminished b/l. PT pulses diminished b/l. Digital hair absent. Skin temperature gradient warm to cool b/l. No ischemia or gangrene. No cyanosis or clubbing noted b/l. Wearing bilateral lower extremity compression wraps present which are clean, dry and intact. Lymphedema present BLE.   Neurological Examination: Protective sensation diminished with 10g monofilament b/l.  Dermatological Examination: No open wounds. No interdigital macerations.   Toenails 1-5 b/l thick, discolored, elongated with subungual debris and pain on dorsal palpation.   No hyperkeratotic nor porokeratotic lesions present on today's visit.  Musculoskeletal Examination: Muscle strength 5/5 to b/l LE. No pain, crepitus or joint limitation noted with ROM bilateral LE. Utilizes rollator for ambulation assistance.  Radiographs: None  ADA Risk Categorization: High Risk  Patient has one or  more of the following: Loss of protective sensation Absent pedal pulses Severe Foot deformity History of foot ulcer  Assessment: 1. Pain due to onychomycosis of toenails of both feet   2. Lymphedema   3. Type II diabetes mellitus with peripheral circulatory disorder (HCC)   4. Encounter for diabetic foot exam [facility])     Plan: -Patient was evaluated and treated. All patient's and/or POA's questions/concerns answered on today's visit. -Diabetic foot examination performed today. -Continue diabetic foot care principles: inspect feet daily, monitor glucose as recommended by PCP and/or Endocrinologist, and follow prescribed diet per PCP, Endocrinologist and/or dietician. -Patient to continue soft, supportive shoe gear daily. -Toenails 1-5 b/l were debrided in length and girth with sterile nail nippers and dremel without iatrogenic bleeding.  -Patient/POA to call  should there be question/concern in the interim. Return in about 3 months (around 09/02/2023).  Freddie Breech, DPM

## 2023-06-03 ENCOUNTER — Encounter (INDEPENDENT_AMBULATORY_CARE_PROVIDER_SITE_OTHER): Payer: 59

## 2023-06-09 ENCOUNTER — Other Ambulatory Visit: Payer: Self-pay

## 2023-06-09 MED ORDER — CARVEDILOL 25 MG PO TABS: 25.0000 mg | ORAL_TABLET | Freq: Two times a day (BID) | ORAL | 1 refills | Status: DC

## 2023-06-09 MED ORDER — ATORVASTATIN CALCIUM 80 MG PO TABS: 80.0000 mg | ORAL_TABLET | Freq: Every day | ORAL | 1 refills | Status: DC

## 2023-06-09 MED ORDER — AMLODIPINE BESYLATE 5 MG PO TABS: 5.0000 mg | ORAL_TABLET | Freq: Every day | ORAL | 3 refills | Status: DC

## 2023-06-13 ENCOUNTER — Encounter (INDEPENDENT_AMBULATORY_CARE_PROVIDER_SITE_OTHER): Payer: Self-pay

## 2023-06-13 ENCOUNTER — Ambulatory Visit (INDEPENDENT_AMBULATORY_CARE_PROVIDER_SITE_OTHER): Payer: 59 | Admitting: Nurse Practitioner

## 2023-06-13 VITALS — BP 149/75 | HR 70 | Resp 18

## 2023-06-13 DIAGNOSIS — I890 Lymphedema, not elsewhere classified: Secondary | ICD-10-CM

## 2023-06-13 NOTE — Progress Notes (Unsigned)
History of Present Illness  There is no documented history at this time  Assessments & Plan   There are no diagnoses linked to this encounter.    Additional instructions  Subjective:  Patient presents with venous ulcer of the Bilateral lower extremity.    Procedure:  3 layer unna wrap was placed Bilateral lower extremity.   Plan:   Follow up in one week.  

## 2023-06-22 ENCOUNTER — Encounter (INDEPENDENT_AMBULATORY_CARE_PROVIDER_SITE_OTHER): Payer: Self-pay

## 2023-06-22 ENCOUNTER — Ambulatory Visit (INDEPENDENT_AMBULATORY_CARE_PROVIDER_SITE_OTHER): Payer: 59 | Admitting: Nurse Practitioner

## 2023-06-22 VITALS — BP 132/66 | HR 69 | Resp 18 | Ht 72.0 in | Wt 231.0 lb

## 2023-06-22 DIAGNOSIS — I890 Lymphedema, not elsewhere classified: Secondary | ICD-10-CM

## 2023-06-22 NOTE — Progress Notes (Unsigned)
History of Present Illness  There is no documented history at this time  Assessments & Plan   There are no diagnoses linked to this encounter.    Additional instructions  Subjective:  Patient presents with venous ulcer of the Right lower extremity.    Procedure:  3 layer unna wrap was placed Right lower extremity.   Plan:   Follow up in one week.   

## 2023-06-29 ENCOUNTER — Encounter (INDEPENDENT_AMBULATORY_CARE_PROVIDER_SITE_OTHER): Payer: 59

## 2023-07-06 ENCOUNTER — Encounter (INDEPENDENT_AMBULATORY_CARE_PROVIDER_SITE_OTHER): Payer: Self-pay

## 2023-07-06 ENCOUNTER — Ambulatory Visit (INDEPENDENT_AMBULATORY_CARE_PROVIDER_SITE_OTHER): Payer: 59 | Admitting: Nurse Practitioner

## 2023-07-06 VITALS — BP 134/68 | HR 71 | Resp 16

## 2023-07-06 DIAGNOSIS — I890 Lymphedema, not elsewhere classified: Secondary | ICD-10-CM

## 2023-07-06 NOTE — Progress Notes (Signed)
 History of Present Illness  There is no documented history at this time  Assessments & Plan   There are no diagnoses linked to this encounter.    Additional instructions  Subjective:  Patient presents with venous ulcer of the Bilateral lower extremity.    Procedure:  3 layer unna wrap was placed Bilateral lower extremity.   Plan:   Follow up in one week.  

## 2023-07-20 ENCOUNTER — Encounter (INDEPENDENT_AMBULATORY_CARE_PROVIDER_SITE_OTHER): Payer: 59 | Admitting: Nurse Practitioner

## 2023-08-07 ENCOUNTER — Other Ambulatory Visit: Payer: Self-pay | Admitting: Physician Assistant

## 2023-08-08 ENCOUNTER — Other Ambulatory Visit: Payer: Self-pay

## 2023-08-08 MED ORDER — ATORVASTATIN CALCIUM 80 MG PO TABS: 80.0000 mg | ORAL_TABLET | Freq: Every day | ORAL | 0 refills | Status: DC

## 2023-08-24 NOTE — Progress Notes (Deleted)
Cardiology Office Note    Date:  08/24/2023   ID:  JOZEPH ROMER, DOB October 31, 1941, MRN [id number]  PCP:  Leanna Sato, MD  Cardiologist:  Debbe Odea, MD  Electrophysiologist:  None   Chief Complaint: Follow up  History of Present Illness:   Grant Mcdaniel is a 82 y.o. male with history of CAD status post PCI/DES to OM1 in 2004 at [facility], CKD stage III, DM2, HTN, HLD, anemia, and chronic venous insufficiency/lymphedema who presents for follow-up of CAD and hypertension.   LHC in 06/2003 demonstrated 75% OM1 stenosis status post PCI/DES with otherwise nonobstructive disease.  He has not required ischemic evaluation since.  Echo from 12/2019 showed an EF of 55 to 60%, mildly increased LV septal wall thickness, borderline LVH, no regional wall motion abnormalities, normal RV systolic function and ventricular cavity size, and trivial mitral regurgitation.   He was seen in the office in 02/2022, at which time it was noted outside office had discontinued benazepril and bisoprolol.  His BP was elevated in the 170s systolic.  Amlodipine was titrated to 10 mg daily.  He was seen in the office on 04/14/2022.  Blood pressures were typically in the 130s to 140s systolic.  He was started on carvedilol 6.25 mg twice daily and continued on amlodipine along with HCTZ.  He was seen in the office in 04/2022.  Amlodipine was decreased to 5 mg daily with further plans to ultimately discontinue, and carvedilol was titrated to 12.5 mg twice daily.  He was seen in the office in 07/2022, and with decreasing amlodipine, he noted some improvement in lower extremity swelling.  Blood pressures at home were in the 120s to 130s systolic.  Amlodipine was discontinued with titration of carvedilol to 25 mg twice daily.  He was last seen in the office in 01/2023 with BPs typically in the 130s to 140s systolic with occasional 120s systolic.  He had no significantly elevated BP readings.  He was not eating out in restaurants and  has not been following passing of his wife.  Amlodipine 5 mg daily was added back to his regimen with recommendation to not escalate to this moving forward.  He was continued on carvedilol 25 mg twice daily and HCTZ 12.5 mg daily.  ***   Labs independently reviewed: 01/2023 - potassium 4.5, BUN 29, serum creatinine 1.44, albumin 4.2, AST/ALT normal, TC 101, TG 329, HDL 29, direct LDL 42, Hgb 11.9, PLT 126 03/980 - A1c 7.6, TSH normal   Past Medical History:  Diagnosis Date   CAD (coronary artery disease)    Chronic venous insufficiency 12/06/2020   CKD (chronic kidney disease) stage 3, GFR 30-59 ml/min (HCC)    CKD stage 3 due to type 2 diabetes mellitus (HCC)    Closed hip fracture requiring operative repair, right, sequela 12/27/2019   Coagulation disorder (HCC) 05/26/2020   Complication of Foley catheter (HCC)    Diabetes (HCC) 05/26/2020   Diabetes mellitus without complication (HCC)    Enlarged prostate 05/26/2020   Essential hypertension    Hypercholesterolemia    Hyperlipidemia    Hypothyroidism    Leukocytosis    Lymphedema 03/11/2022   Monoclonal gammopathy 12/19/2019   Pain due to onychomycosis of toenails of both feet 05/26/2020   Preop examination    PVD (peripheral vascular disease) [facility])     Past Surgical History:  Procedure Laterality Date   galbladder     HIP ARTHROPLASTY Right 12/28/2019   Procedure: ARTHROPLASTY BIPOLAR  HIP (HEMIARTHROPLASTY);  Surgeon: Juanell Fairly, MD;  Location: [facility] ORS;  Service: Orthopedics;  Laterality: Right;    Current Medications: No outpatient medications have been marked as taking for the 08/25/23 encounter (Appointment) with Sondra Barges, PA-C.    Allergies:   Sulfa antibiotics   Social History   Socioeconomic History   Marital status: Married    Spouse name: Not on file   Number of children: Not on file   Years of education: Not on file   Highest education level: Not on file  Occupational History   Not on file  Tobacco Use    Smoking status: Former    Types: Cigarettes   Smokeless tobacco: Former    Types: Chew   Tobacco comments:    occasionally  Vaping Use   Vaping status: Never Used  Substance and Sexual Activity   Alcohol use: Never   Drug use: Never   Sexual activity: Not on file  Other Topics Concern   Not on file  Social History Narrative   Not on file   Social Determinants of Health   Financial Resource Strain: Not on file  Food Insecurity: Not on file  Transportation Needs: Not on file  Physical Activity: Not on file  Stress: Not on file  Social Connections: Not on file     Family History:  The patient's family history includes CAD in his father; Cancer in his mother; Hypertension in his brother.  ROS:   12-point review of systems is negative unless otherwise noted in the HPI.   EKGs/Labs/Other Studies Reviewed:    Studies reviewed were summarized above. The additional studies were reviewed today:  2D echo 12/29/2019:  1. Left ventricular ejection fraction, by visual estimation, is 55 to  60%. The left ventricle has normal function. Left ventricular septal wall  thickness was mildly increased. There is borderline left ventricular  hypertrophy.   2. The left ventricle has no regional wall motion abnormalities.   3. Global right ventricle has normal systolic function.The right  ventricular size is normal. No increase in right ventricular wall  thickness.   4. Left atrial size was normal.   5. Right atrial size was normal.   6. The mitral valve is grossly normal. Trivial mitral valve  regurgitation.   7. The tricuspid valve is grossly normal.   8. The aortic valve is tricuspid. Aortic valve regurgitation is not  visualized.   9. The pulmonic valve was grossly normal. Pulmonic valve regurgitation is  not visualized.  10. The atrial septum is grossly normal. __________   LHC 06/2003: CORONARY ARTERIOGRAMS   Branch                    Stenosis       Lesion Type  TIMI Flow    ------------------------  -------------  -----------  ----------   Left Circumflex      *OM1                   75%            Diffuse   Left Anterior Descending      Mid LAD                <25%           Tubular      Dist LAD               50%  Tubular      Dist LAD               25%            Tubular      D1                     <25%           Tubular   Right Coronary Artery     Not evaluated   Left Main                 normal   * Denotes significant lesion    Tree Comment:  75% OM1 was directly stented with a 2.5x68mm Cypher DES at 8 atm and post-dilated with a 2.25x52mm Quantum Maverick to 16 atm.  Excellent result.   INTERVENTIONAL CATHETERIZATION   Lesion #1:  OM1 75%  (pre)  -> <25%  (interval)  -> Normal (post)    EKG:  EKG is ordered today.  The EKG ordered today demonstrates ***  Recent Labs: 01/27/2023: ALT 14; BUN 29; Creatinine, Ser 1.44; Hemoglobin 11.9; Platelets 126; Potassium 4.5; Sodium 134  Recent Lipid Panel    Component Value Date/Time   CHOL 101 01/27/2023 1610   TRIG 329 (H) 01/27/2023 1610   HDL 29 (L) 01/27/2023 1610   CHOLHDL 3.5 01/27/2023 1610   VLDL 66 (H) 01/27/2023 1610   LDLCALC 6 01/27/2023 1610   LDLDIRECT 42 01/27/2023 1610    PHYSICAL EXAM:    VS:  There were no vitals taken for this visit.  BMI: There is no height or weight on file to calculate BMI.  Physical Exam  Wt Readings from Last 3 Encounters:  06/22/23 231 lb (104.8 kg)  01/27/23 231 lb 12.8 oz (105.1 kg)  07/20/22 232 lb (105.2 kg)     ASSESSMENT & PLAN:   CAD involving native coronary arteries without angina:  HTN: Blood pressure  HLD: LDL 42.  CKD stage III:  Anemia:  Chronic venous insufficiency/lipedema:   {Are you ordering a CV Procedure (e.g. stress test, cath, DCCV, TEE, etc)?   Press F2        :[id number]}     Disposition: F/u with Dr. Azucena Cecil or an APP in ***.   Medication Adjustments/Labs and Tests Ordered: Current medicines are  reviewed at length with the patient today.  Concerns regarding medicines are outlined above. Medication changes, Labs and Tests ordered today are summarized above and listed in the Patient Instructions accessible in Encounters.   Signed, Eula Listen, PA-C 08/24/2023 4:23 PM     [facility] [place] [address] [place], [place] [address] [phone]

## 2023-08-25 ENCOUNTER — Ambulatory Visit: Payer: Medicare Other | Admitting: Physician Assistant

## 2023-09-12 ENCOUNTER — Ambulatory Visit (INDEPENDENT_AMBULATORY_CARE_PROVIDER_SITE_OTHER): Payer: 59 | Admitting: Podiatry

## 2023-09-12 DIAGNOSIS — Z91199 Patient's noncompliance with other medical treatment and regimen due to unspecified reason: Secondary | ICD-10-CM

## 2023-09-12 NOTE — Progress Notes (Signed)
1. No-show for appointment     

## 2023-09-12 NOTE — Progress Notes (Deleted)
Cardiology Office Note    Date:  09/12/2023   ID:  Grant Mcdaniel 12-Sep-1941, MRN [id number]  PCP:  Leanna Sato, MD  Cardiologist:  Debbe Odea, MD  Electrophysiologist:  None   Chief Complaint: Follow up  History of Present Illness:   Grant Mcdaniel is a 82 y.o. male with history of CAD status post PCI/DES to OM1 in 2004 at [facility], CKD stage III, DM2, HTN, HLD, anemia, and chronic venous insufficiency/lymphedema who presents for follow-up of CAD and hypertension.   LHC in 06/2003 demonstrated 75% OM1 stenosis status post PCI/DES with otherwise nonobstructive disease.  He has not required ischemic evaluation since.  Echo from 12/2019 showed an EF of 55 to 60%, mildly increased LV septal wall thickness, borderline LVH, no regional wall motion abnormalities, normal RV systolic function and ventricular cavity size, and trivial mitral regurgitation.   He was seen in the office in 02/2022, at which time it was noted outside office had discontinued benazepril and bisoprolol.  His BP was elevated in the 170s systolic.  Amlodipine was titrated to 10 mg daily.  He was seen in the office on 04/14/2022.  Blood pressures were typically in the 130s to 140s systolic.  He was started on carvedilol 6.25 mg twice daily and continued on amlodipine along with HCTZ.  He was seen in the office in 04/2022.  Amlodipine was decreased to 5 mg daily with further plans to ultimately discontinue, and carvedilol was titrated to 12.5 mg twice daily.  He was seen in the office in 07/2022, and with decreasing amlodipine, he noted some improvement in lower extremity swelling.  Blood pressures at home were in the 120s to 130s systolic.  Amlodipine was discontinued with titration of carvedilol to 25 mg twice daily.  He was last seen in the office in 01/2023 with BPs typically in the 130s to 140s systolic with occasional 120s systolic.  He had no significantly elevated BP readings.  He was not eating out in restaurants and  has not been following passing of his wife.  Amlodipine 5 mg daily was added back to his regimen with recommendation to not escalate to this moving forward.  He was continued on carvedilol 25 mg twice daily and HCTZ 12.5 mg daily.  ***   Labs independently reviewed: 01/2023 - potassium 4.5, BUN 29, serum creatinine 1.44, albumin 4.2, AST/ALT normal, TC 101, TG 329, HDL 29, direct LDL 42, Hgb 11.9, PLT 126 03/980 - A1c 7.6, TSH normal   Past Medical History:  Diagnosis Date   CAD (coronary artery disease)    Chronic venous insufficiency 12/06/2020   CKD (chronic kidney disease) stage 3, GFR 30-59 ml/min (HCC)    CKD stage 3 due to type 2 diabetes mellitus (HCC)    Closed hip fracture requiring operative repair, right, sequela 12/27/2019   Coagulation disorder (HCC) 05/26/2020   Complication of Foley catheter (HCC)    Diabetes (HCC) 05/26/2020   Diabetes mellitus without complication (HCC)    Enlarged prostate 05/26/2020   Essential hypertension    Hypercholesterolemia    Hyperlipidemia    Hypothyroidism    Leukocytosis    Lymphedema 03/11/2022   Monoclonal gammopathy 12/19/2019   Pain due to onychomycosis of toenails of both feet 05/26/2020   Preop examination    PVD (peripheral vascular disease) [facility])     Past Surgical History:  Procedure Laterality Date   galbladder     HIP ARTHROPLASTY Right 12/28/2019   Procedure: ARTHROPLASTY BIPOLAR  HIP (HEMIARTHROPLASTY);  Surgeon: Juanell Fairly, MD;  Location: [facility] ORS;  Service: Orthopedics;  Laterality: Right;    Current Medications: No outpatient medications have been marked as taking for the 09/16/23 encounter (Appointment) with Sondra Barges, PA-C.    Allergies:   Sulfa antibiotics   Social History   Socioeconomic History   Marital status: Married    Spouse name: Not on file   Number of children: Not on file   Years of education: Not on file   Highest education level: Not on file  Occupational History   Not on file  Tobacco Use    Smoking status: Former    Types: Cigarettes   Smokeless tobacco: Former    Types: Chew   Tobacco comments:    occasionally  Vaping Use   Vaping status: Never Used  Substance and Sexual Activity   Alcohol use: Never   Drug use: Never   Sexual activity: Not on file  Other Topics Concern   Not on file  Social History Narrative   Not on file   Social Determinants of Health   Financial Resource Strain: Not on file  Food Insecurity: Not on file  Transportation Needs: Not on file  Physical Activity: Not on file  Stress: Not on file  Social Connections: Not on file     Family History:  The patient's family history includes CAD in his father; Cancer in his mother; Hypertension in his brother.  ROS:   12-point review of systems is negative unless otherwise noted in the HPI.   EKGs/Labs/Other Studies Reviewed:    Studies reviewed were summarized above. The additional studies were reviewed today:  2D echo 12/29/2019:  1. Left ventricular ejection fraction, by visual estimation, is 55 to  60%. The left ventricle has normal function. Left ventricular septal wall  thickness was mildly increased. There is borderline left ventricular  hypertrophy.   2. The left ventricle has no regional wall motion abnormalities.   3. Global right ventricle has normal systolic function.The right  ventricular size is normal. No increase in right ventricular wall  thickness.   4. Left atrial size was normal.   5. Right atrial size was normal.   6. The mitral valve is grossly normal. Trivial mitral valve  regurgitation.   7. The tricuspid valve is grossly normal.   8. The aortic valve is tricuspid. Aortic valve regurgitation is not  visualized.   9. The pulmonic valve was grossly normal. Pulmonic valve regurgitation is  not visualized.  10. The atrial septum is grossly normal. __________   LHC 06/2003: CORONARY ARTERIOGRAMS   Branch                    Stenosis       Lesion Type  TIMI Flow    ------------------------  -------------  -----------  ----------   Left Circumflex      *OM1                   75%            Diffuse   Left Anterior Descending      Mid LAD                <25%           Tubular      Dist LAD               50%  Tubular      Dist LAD               25%            Tubular      D1                     <25%           Tubular   Right Coronary Artery     Not evaluated   Left Main                 normal   * Denotes significant lesion    Tree Comment:  75% OM1 was directly stented with a 2.5x4mm Cypher DES at 8 atm and post-dilated with a 2.25x40mm Quantum Maverick to 16 atm.  Excellent result.   INTERVENTIONAL CATHETERIZATION   Lesion #1:  OM1 75%  (pre)  -> <25%  (interval)  -> Normal (post)    EKG:  EKG is ordered today.  The EKG ordered today demonstrates ***  Recent Labs: 01/27/2023: ALT 14; BUN 29; Creatinine, Ser 1.44; Hemoglobin 11.9; Platelets 126; Potassium 4.5; Sodium 134  Recent Lipid Panel    Component Value Date/Time   CHOL 101 01/27/2023 1610   TRIG 329 (H) 01/27/2023 1610   HDL 29 (L) 01/27/2023 1610   CHOLHDL 3.5 01/27/2023 1610   VLDL 66 (H) 01/27/2023 1610   LDLCALC 6 01/27/2023 1610   LDLDIRECT 42 01/27/2023 1610    PHYSICAL EXAM:    VS:  There were no vitals taken for this visit.  BMI: There is no height or weight on file to calculate BMI.  Physical Exam  Wt Readings from Last 3 Encounters:  06/22/23 231 lb (104.8 kg)  01/27/23 231 lb 12.8 oz (105.1 kg)  07/20/22 232 lb (105.2 kg)     ASSESSMENT & PLAN:   CAD involving native coronary arteries without angina:  HTN: Blood pressure  HLD: LDL 42.  CKD stage III:  Anemia:  Chronic venous insufficiency/lipedema:   {Are you ordering a CV Procedure (e.g. stress test, cath, DCCV, TEE, etc)?   Press F2        :[id number]}     Disposition: F/u with Dr. Azucena Cecil or an APP in ***.   Medication Adjustments/Labs and Tests Ordered: Current medicines are  reviewed at length with the patient today.  Concerns regarding medicines are outlined above. Medication changes, Labs and Tests ordered today are summarized above and listed in the Patient Instructions accessible in Encounters.   Signed, Eula Listen, PA-C 09/12/2023 4:02 PM     [facility] -  [address] [place], [place] [address] [phone]

## 2023-09-16 ENCOUNTER — Ambulatory Visit: Payer: Medicare Other | Attending: Physician Assistant | Admitting: Physician Assistant

## 2023-09-16 ENCOUNTER — Encounter: Payer: Self-pay | Admitting: Physician Assistant

## 2023-09-20 DEATH — deceased

## 2023-10-21 DEATH — deceased
